# Patient Record
Sex: Female | Born: 1975 | Race: White | Hispanic: No | Marital: Married | Smoking: Never smoker
Health system: Southern US, Community
[De-identification: ages and names within clinical notes are randomized; demographics above are authoritative.]

## PROBLEM LIST (undated history)

## (undated) DIAGNOSIS — K219 Gastro-esophageal reflux disease without esophagitis: Secondary | ICD-10-CM

## (undated) DIAGNOSIS — D649 Anemia, unspecified: Secondary | ICD-10-CM

## (undated) DIAGNOSIS — L732 Hidradenitis suppurativa: Secondary | ICD-10-CM

## (undated) DIAGNOSIS — R011 Cardiac murmur, unspecified: Secondary | ICD-10-CM

## (undated) DIAGNOSIS — M722 Plantar fascial fibromatosis: Secondary | ICD-10-CM

## (undated) DIAGNOSIS — E669 Obesity, unspecified: Secondary | ICD-10-CM

## (undated) DIAGNOSIS — F32A Depression, unspecified: Secondary | ICD-10-CM

## (undated) DIAGNOSIS — F329 Major depressive disorder, single episode, unspecified: Secondary | ICD-10-CM

## (undated) DIAGNOSIS — F419 Anxiety disorder, unspecified: Secondary | ICD-10-CM

## (undated) DIAGNOSIS — N801 Endometriosis of ovary: Secondary | ICD-10-CM

## (undated) DIAGNOSIS — J45909 Unspecified asthma, uncomplicated: Secondary | ICD-10-CM

## (undated) DIAGNOSIS — R112 Nausea with vomiting, unspecified: Secondary | ICD-10-CM

## (undated) DIAGNOSIS — Z22322 Carrier or suspected carrier of Methicillin resistant Staphylococcus aureus: Secondary | ICD-10-CM

## (undated) DIAGNOSIS — T8859XA Other complications of anesthesia, initial encounter: Secondary | ICD-10-CM

## (undated) DIAGNOSIS — N80109 Endometriosis of ovary, unspecified side, unspecified depth: Secondary | ICD-10-CM

## (undated) DIAGNOSIS — Z9889 Other specified postprocedural states: Secondary | ICD-10-CM

## (undated) DIAGNOSIS — K449 Diaphragmatic hernia without obstruction or gangrene: Secondary | ICD-10-CM

## (undated) DIAGNOSIS — N83209 Unspecified ovarian cyst, unspecified side: Secondary | ICD-10-CM

## (undated) DIAGNOSIS — T4145XA Adverse effect of unspecified anesthetic, initial encounter: Secondary | ICD-10-CM

## (undated) DIAGNOSIS — T7840XA Allergy, unspecified, initial encounter: Secondary | ICD-10-CM

## (undated) DIAGNOSIS — M1990 Unspecified osteoarthritis, unspecified site: Secondary | ICD-10-CM

## (undated) DIAGNOSIS — L4050 Arthropathic psoriasis, unspecified: Secondary | ICD-10-CM

## (undated) DIAGNOSIS — R51 Headache: Secondary | ICD-10-CM

## (undated) DIAGNOSIS — G5600 Carpal tunnel syndrome, unspecified upper limb: Secondary | ICD-10-CM

## (undated) HISTORY — PX: DILATION AND CURETTAGE OF UTERUS: SHX78

## (undated) HISTORY — DX: Carrier or suspected carrier of methicillin resistant Staphylococcus aureus: Z22.322

## (undated) HISTORY — DX: Obesity, unspecified: E66.9

## (undated) HISTORY — PX: TONSILLECTOMY: SUR1361

## (undated) HISTORY — PX: CHOLECYSTECTOMY: SHX55

## (undated) HISTORY — PX: FRACTURE SURGERY: SHX138

## (undated) HISTORY — DX: Allergy, unspecified, initial encounter: T78.40XA

## (undated) HISTORY — DX: Gastro-esophageal reflux disease without esophagitis: K21.9

## (undated) HISTORY — DX: Major depressive disorder, single episode, unspecified: F32.9

## (undated) HISTORY — DX: Plantar fascial fibromatosis: M72.2

## (undated) HISTORY — DX: Arthropathic psoriasis, unspecified: L40.50

## (undated) HISTORY — DX: Diaphragmatic hernia without obstruction or gangrene: K44.9

## (undated) HISTORY — DX: Endometriosis of ovary: N80.1

## (undated) HISTORY — DX: Hidradenitis suppurativa: L73.2

## (undated) HISTORY — DX: Anxiety disorder, unspecified: F41.9

## (undated) HISTORY — DX: Endometriosis of ovary, unspecified side, unspecified depth: N80.109

## (undated) HISTORY — DX: Anemia, unspecified: D64.9

## (undated) HISTORY — DX: Carpal tunnel syndrome, unspecified upper limb: G56.00

## (undated) HISTORY — DX: Unspecified ovarian cyst, unspecified side: N83.209

## (undated) HISTORY — PX: ABDOMINAL HYSTERECTOMY: SHX81

## (undated) HISTORY — DX: Depression, unspecified: F32.A

## (undated) HISTORY — DX: Unspecified osteoarthritis, unspecified site: M19.90

---

## 1995-03-24 HISTORY — PX: DIAGNOSTIC LAPAROSCOPY: SUR761

## 1997-09-27 ENCOUNTER — Inpatient Hospital Stay (HOSPITAL_COMMUNITY): Admission: AD | Admit: 1997-09-27 | Discharge: 1997-09-27 | Payer: Self-pay | Admitting: Obstetrics & Gynecology

## 1997-10-01 ENCOUNTER — Ambulatory Visit (HOSPITAL_COMMUNITY): Admission: RE | Admit: 1997-10-01 | Discharge: 1997-10-01 | Payer: Self-pay | Admitting: Obstetrics and Gynecology

## 1997-10-20 ENCOUNTER — Inpatient Hospital Stay (HOSPITAL_COMMUNITY): Admission: AD | Admit: 1997-10-20 | Discharge: 1997-10-22 | Payer: Self-pay | Admitting: Obstetrics and Gynecology

## 1998-10-07 ENCOUNTER — Other Ambulatory Visit: Admission: RE | Admit: 1998-10-07 | Discharge: 1998-10-07 | Payer: Self-pay | Admitting: Obstetrics and Gynecology

## 1998-11-12 ENCOUNTER — Inpatient Hospital Stay (HOSPITAL_COMMUNITY): Admission: AD | Admit: 1998-11-12 | Discharge: 1998-11-12 | Payer: Self-pay | Admitting: Obstetrics and Gynecology

## 1998-11-12 ENCOUNTER — Encounter: Payer: Self-pay | Admitting: Obstetrics and Gynecology

## 1999-06-15 ENCOUNTER — Inpatient Hospital Stay (HOSPITAL_COMMUNITY): Admission: AD | Admit: 1999-06-15 | Discharge: 1999-06-15 | Payer: Self-pay | Admitting: Obstetrics and Gynecology

## 1999-07-01 ENCOUNTER — Inpatient Hospital Stay (HOSPITAL_COMMUNITY): Admission: AD | Admit: 1999-07-01 | Discharge: 1999-07-04 | Payer: Self-pay | Admitting: Obstetrics and Gynecology

## 1999-11-03 ENCOUNTER — Other Ambulatory Visit: Admission: RE | Admit: 1999-11-03 | Discharge: 1999-11-03 | Payer: Self-pay | Admitting: Obstetrics and Gynecology

## 2000-11-09 ENCOUNTER — Other Ambulatory Visit: Admission: RE | Admit: 2000-11-09 | Discharge: 2000-11-09 | Payer: Self-pay | Admitting: Obstetrics and Gynecology

## 2001-11-17 ENCOUNTER — Other Ambulatory Visit: Admission: RE | Admit: 2001-11-17 | Discharge: 2001-11-17 | Payer: Self-pay | Admitting: Obstetrics and Gynecology

## 2002-06-08 ENCOUNTER — Emergency Department (HOSPITAL_COMMUNITY): Admission: EM | Admit: 2002-06-08 | Discharge: 2002-06-08 | Payer: Self-pay

## 2002-07-20 ENCOUNTER — Encounter: Admission: RE | Admit: 2002-07-20 | Discharge: 2002-07-20 | Payer: Self-pay | Admitting: Family Medicine

## 2002-07-20 ENCOUNTER — Encounter: Payer: Self-pay | Admitting: Family Medicine

## 2002-11-19 ENCOUNTER — Other Ambulatory Visit: Admission: RE | Admit: 2002-11-19 | Discharge: 2002-11-19 | Payer: Self-pay | Admitting: Obstetrics and Gynecology

## 2003-07-30 ENCOUNTER — Observation Stay (HOSPITAL_COMMUNITY): Admission: RE | Admit: 2003-07-30 | Discharge: 2003-07-31 | Payer: Self-pay | Admitting: General Surgery

## 2003-07-30 ENCOUNTER — Encounter (INDEPENDENT_AMBULATORY_CARE_PROVIDER_SITE_OTHER): Payer: Self-pay

## 2004-01-15 ENCOUNTER — Other Ambulatory Visit: Admission: RE | Admit: 2004-01-15 | Discharge: 2004-01-15 | Payer: Self-pay | Admitting: *Deleted

## 2005-01-19 ENCOUNTER — Other Ambulatory Visit: Admission: RE | Admit: 2005-01-19 | Discharge: 2005-01-19 | Payer: Self-pay | Admitting: *Deleted

## 2005-05-06 ENCOUNTER — Encounter: Admission: RE | Admit: 2005-05-06 | Discharge: 2005-05-06 | Payer: Self-pay | Admitting: *Deleted

## 2006-01-26 ENCOUNTER — Other Ambulatory Visit: Admission: RE | Admit: 2006-01-26 | Discharge: 2006-01-26 | Payer: Self-pay | Admitting: Obstetrics & Gynecology

## 2007-01-13 ENCOUNTER — Encounter: Admission: RE | Admit: 2007-01-13 | Discharge: 2007-01-13 | Payer: Self-pay | Admitting: Family Medicine

## 2007-03-16 ENCOUNTER — Other Ambulatory Visit: Admission: RE | Admit: 2007-03-16 | Discharge: 2007-03-16 | Payer: Self-pay | Admitting: Obstetrics and Gynecology

## 2007-08-24 DIAGNOSIS — M722 Plantar fascial fibromatosis: Secondary | ICD-10-CM

## 2007-08-24 HISTORY — DX: Plantar fascial fibromatosis: M72.2

## 2008-03-21 ENCOUNTER — Other Ambulatory Visit: Admission: RE | Admit: 2008-03-21 | Discharge: 2008-03-21 | Payer: Self-pay | Admitting: Obstetrics & Gynecology

## 2010-07-23 HISTORY — PX: LAPAROSCOPIC TOTAL HYSTERECTOMY: SUR800

## 2010-07-28 ENCOUNTER — Encounter: Payer: Self-pay | Admitting: Obstetrics and Gynecology

## 2010-07-28 ENCOUNTER — Ambulatory Visit (HOSPITAL_COMMUNITY)
Admission: RE | Admit: 2010-07-28 | Discharge: 2010-07-29 | Payer: Self-pay | Source: Home / Self Care | Attending: Obstetrics and Gynecology | Admitting: Obstetrics and Gynecology

## 2010-09-23 NOTE — Op Note (Signed)
Mary Davenport, Mary Davenport              ACCOUNT NO.:  [id number]  MEDICAL RECORD NO.:  [id number]          PATIENT TYPE:  AMB  LOCATION:  SDC                           FACILITY:  [facility]  PHYSICIAN:  Cynthia P. Romine, M.D.DATE OF BIRTH:  Mar 11, 1976  DATE OF PROCEDURE:  07/28/2010 DATE OF DISCHARGE:                              OPERATIVE REPORT   PREOPERATIVE DIAGNOSES:  Menorrhagia, dyspareunia, pelvic pain, known endometriosis.  POSTOPERATIVE DIAGNOSES:  Menorrhagia, dyspareunia, pelvic pain, known endometriosis, pathology pending.  PROCEDURE:  Robotic-assisted total laparoscopic hysterectomy.  SURGEON:  Cynthia P. Romine, M.D.  ASSISTANT:  Dr. Leda Quail.  ANESTHESIA:  General endotracheal.  ESTIMATED BLOOD LOSS:  50 mL.  COMPLICATIONS:  None.  PROCEDURE:  The patient was taken to the operating room and after induction of adequate general endotracheal anesthesia, she was placed in the low dorsal lithotomy position and prepped and draped in the usual fashion.  A posterior weighted and anterior Deaver were placed.  The cervix was grasped on its anterior lip with a single-tooth tenaculum. The uterus sounded to 7 cm.  The cervix was dilated to a #19 Shawnie Pons.  A 6- cm RUMI manipulator with a small KOH ring was placed without difficulty. The bladder was drained with a Foley catheter.  Attention was next turned to the abdomen.  The infraumbilical crease was infiltrated with 0.25% Marcaine plain at the site of previous laparoscopy.  The skin was incised with a knife.  A Veress needle was inserted into peritoneal space.  Proper placement was tested by noting a negative aspirate, then free flow of saline to the Veress needle again with a negative aspirate, and by noting the response of saline placed at the hub of the Veress needle to negative pressure as the abdominal wall was elevated.  When the pneumoperitoneum was attempted to be placed, it was noted that the pressure was running  at about 15 mmHg and it was not felt that the Veress needle was in the proper place.  This was removed.  Another attempt was made with the Veress needle, which was likewise unsuccessful.  A third attempt was made with a long Veress needle and this was also unsuccessful, always having the pressure around 15 mmHg despite the hanging drop test of the Veress needle.  Therefore, open laparoscopy was decided upon.  The fascia was cleared off bluntly and grasped with Kochers, it was elevated between the Graham and entered with the Mayo scissors.  The fascia was separated, the underlying peritoneum was elevated between hemostats and entered atraumatically.  A pursestring suture was placed around the fascia with 0 Vicryl and a Hasson trocar was placed into peritoneal space.  The laparoscope was then inserted initially at 30 degree down, laparoscope was used but it was found to be difficult to visualize the pelvis well and it was removed and a 0-degree scope was placed.  Pneumoperitoneum was created to an abdominal pressure of 15 mmHg using the automatic insufflator. The scope was then used to transilluminate the abdominal wall and the site for the two 8-mm robotic trocars and the 12-mm assistance port were marked with a marking pen,  incised with Marcaine, and the trocars were inserted under direct visualization.  The robot was then brought in and docked from side without difficulty.  Instruments were placed through the port using monopolar scissors in arm 1 and that the PK gyrus in arm 2.  The instrument was brought in under direct visualization.  The procedure then proceeded to the console.  The pelvis was inspected.  The tubes and ovaries appeared normal.  She had had some adhesions between her right ovary and the sidewall at a previous laparoscopy and it was anticipated that there would be some adhesions but there were none. Really, no evidence of persistent endometriosis could be seen,  ureters were seen peristalsing bilaterally very clearly.  Decision was made to retain the ovary.  The PK gyrus was used to cauterize the pedicle containing the tube, the utero-ovarian ligament, and the round ligament on each side.  The anterior and posterior leaves of broad ligament were taken down sharply on each side.  The bladder was dissected off the KOH ring with sharp dissection and cautery.  The uterine arteries on each side were skeletonized, then cauterized with the gyrus and cut.  A circumferential colpotomy incision was then made with monopolar scissors.  The specimen was delivered into the vagina and left there for pneumo-occlusion.  The uterine artery on the right was noted to be slightly bleeding and this was further cauterized.  Instruments were then removed and the mega suture cut needle holder was placed in 1 and the PK gyrus in arm 2.  A 0 Vicryl was used to place figure-of-eight angle sutures on each side.  After placing the angle suture on the patient's right, the end of the suture was used to elevate the vaginal cuff and further inspect that uterine artery.  It was further cauterized to assure hemostasis.  The course of the ureter was clearly seen and the area of cautery was well away from the ureter.  The vaginal cuff was closed with a total of 4 figure-of-eight 0 Vicryl sutures.  The pelvis was irrigated.  It was hemostatic.  A sheet of intercede was placed over the vaginal cuff.  The instruments removed from the abdomen.  The trocar sleeves were removed under direct visualization.  The scope was then withdrawn.  Pneumoperitoneum was completely released using manual deep breath and pressure on the abdominal wall to remove the gas from the peritoneal cavity.  The umbilical trocar sleeve was then removed.  The pursestring suture was tied.  A single suture of 0 Vicryl was placed in the fascia at the assistance port in the right lower quadrant.  All 4 incisions were  closed subcuticularly with 4-0 Vicryl Rapide and then Dermabond.  The specimen was removed from the vagina.  Foley catheter was removed and the procedure was terminated.  Sponge and instrument counts were correct x3.  The patient tolerated it well and satisfactory condition to postanesthesia recovery.     Cynthia P. Romine, M.D.     CPR/MEDQ  D:  07/28/2010  T:  07/29/2010  Job:  [id number]  Electronically Signed by Meredeth Ide M.D. on 09/23/2010 08:53:49 PM

## 2010-11-03 LAB — COMPREHENSIVE METABOLIC PANEL
Alkaline Phosphatase: 62 U/L (ref 39–117)
BUN: 9 mg/dL (ref 6–23)
Glucose, Bld: 82 mg/dL (ref 70–99)
Potassium: 4 mEq/L (ref 3.5–5.1)
Total Bilirubin: 0.4 mg/dL (ref 0.3–1.2)
Total Protein: 6.9 g/dL (ref 6.0–8.3)

## 2010-11-03 LAB — CBC
HCT: 35.6 % — ABNORMAL LOW (ref 36.0–46.0)
HCT: 39.9 % (ref 36.0–46.0)
MCV: 90.8 fL (ref 78.0–100.0)
Platelets: 382 10*3/uL (ref 150–400)
RDW: 12 % (ref 11.5–15.5)
RDW: 12.3 % (ref 11.5–15.5)
WBC: 12 10*3/uL — ABNORMAL HIGH (ref 4.0–10.5)
WBC: 6.4 10*3/uL (ref 4.0–10.5)

## 2010-11-03 LAB — HCG, SERUM, QUALITATIVE: Preg, Serum: NEGATIVE

## 2011-01-08 NOTE — Op Note (Signed)
NAME:  Mary Davenport, Mary Davenport                        ACCOUNT NO.:  [id number]   MEDICAL RECORD NO.:  [id number]                   PATIENT TYPE:  AMB   LOCATION:  DAY                                  FACILITY:  [facility]   PHYSICIAN:  Adolph Pollack, M.D.            DATE OF BIRTH:  [birth year]   DATE OF PROCEDURE:  07/30/2003  DATE OF DISCHARGE:                                 OPERATIVE REPORT   PREOPERATIVE DIAGNOSIS:  Symptomatic cholelithiasis.   POSTOPERATIVE DIAGNOSIS:  Symptomatic cholelithiasis.   PROCEDURE:  Laparoscopic cholecystectomy with intraoperative cholangiogram.   SURGEON:  Adolph Pollack, M.D.   ASSISTANT:  Sheppard Plumber. Earlene Plater, M.D.   INTRAOPERATIVE CONSULTATION:  Andres Ege, M.D.   INDICATIONS:  This is a 35 year old female who has been having intermittent  abdominal pains in the right lower quadrant but then began having some  pressure-type abdominal right upper quadrant pains associated with nausea.  She had a history of endometriosis in the past.  A CT scan was performed  demonstrating multiple gallstones.  She now presents for a laparoscopic  cholecystectomy and also intraoperative observation of her pelvis organs by  Dr. Laurena Bering looking for recurrent endometriosis.   TECHNIQUE:  She is seen in the holding area, then brought to the operating  room, placed supine on the operating table, and the general anesthetic was  administered.  Her abdominal wall was then sterilely prepped and draped.  Dilute Marcaine solution was infiltrated in the subumbilical region and a  subumbilical incision is made incising the skin and subcutaneous tissue  sharply.  The fascia was identified and a small incision made in it.  A  small incision was made in the peritoneum and the peritoneal cavity and was  entered under direct vision.  A pursestring suture of 0 Vicryl was placed  around the fascial edges.  A Hasson trocar was introduced into the  peritoneal cavity and a  pneumoperitoneum was created by insufflation of CO2  gas.   Next the laparoscope was introduced and a single trocar is placed in the  right midabdomen.  The patient placed in Trendelenburg position and the  pelvic organs were examined.  The anterior and posterior aspects of the  uterus as well as the cul-de-sacs and prececal area were evaluated, and no  endometriosis was noted.  Both the adnexa and the ovaries were seen without  any evidence of endometriosis.  No pelvic sidewall endometriosis or implants  on the colon.  Pictures were taken and given to Dr. Laurena Bering, who is present.   Following this she was placed in the reverse Trendelenburg position with the  right side tilted up.  Another 5 mm trocar is placed through a right  midabdominal incision and an 11 mm trocar is placed through an epigastric  incision.  The fundus of the gallbladder is grasped and retracted to the  right shoulder.  Filmy adhesions between the duodenum and the  gallbladder  were taken down bluntly.  The infundibulum was then grasped and retracted  laterally and using blunt dissection and select cautery, the infundibulum  was mobilized.  The cystic duct was then identified and a window created  around it.  A clip was placed just above the cystic duct-gallbladder  junction and a small incision was made in the cystic duct.  A  cholangiocatheter was passed through the anterior abdominal wall and placed  into the cystic duct and the cholangiogram was performed.   Under real-time fluoroscopy dilute contrast material was injected into the  cystic duct, which was of moderate to long length.  The common hepatic,  right and left hepatic, and common bile ducts all filled promptly and  contrast spilled promptly to the duodenum. without obvious evidence of  obstruction.  The final report is pending the radiologist's interpretation.   The cholangiocatheter was removed, and the cystic duct was then clipped  three times on the  staying-in side and then divided.  The cystic artery was  identified and a window created around it.  It was clipped and divided.  The  gallbladder was then dissected free from the liver bed intact.  The  gallbladder fossa was then irrigated and bleeding points controlled with the  cautery.  It was inspected a number of times and no further bleeding was  noted.  No bile leak was noted.  The gallbladder was then removed through  the subumbilical port.  Multiple stones were noted.  The subumbilical  fascial defect was then closed under direct vision by tightening up and  tying down the pursestring suture.  The perihepatic area was irrigated and  the fluid returned clear.  No blood or bile staining was noted.   Following this the trocars were removed and the pneumoperitoneum was  released.  The skin incisions were closed with 4-0 Monocryl subcuticular  stitches.  Steri-Strips and sterile dressings were applied.   She tolerated the procedure well without any apparent complications and was  taken to the recovery room in satisfactory condition.                                               Adolph Pollack, M.D.    Kari Baars  D:  07/30/2003  T:  07/30/2003  Job:  [id number]   cc:   Bryan Lemma. Manus Gunning, M.D.  [address] [place]  [place] [address]  Fax: [phone]   Laqueta Linden, M.D.  [address]  [place]  [place] [address]  Fax: [phone]   Andres Ege, M.D.  [address]  [place]  [place] [address]  Fax: [phone]

## 2011-01-08 NOTE — Consult Note (Signed)
NAME:  Mary Davenport, Mary Davenport                        ACCOUNT NO.:  [id number]   MEDICAL RECORD NO.:  [id number]                   PATIENT TYPE:  AMB   LOCATION:  DAY                                  FACILITY:  [facility]   PHYSICIAN:  Andres Ege, M.D.            DATE OF BIRTH:  11-15-1975   DATE OF CONSULTATION:  07/30/2003  DATE OF DISCHARGE:                                   CONSULTATION   PRIMARY OPERATING SURGEON:  Adolph Pollack, M.D.   PREOPERATIVE DIAGNOSES:  Chronic pelvic pain with a history of minimal  endometriosis.   POSTOPERATIVE DIAGNOSES:  Normal pelvis.   HISTORY AND INDICATIONS FOR CONSULT:  [name] is a 35 year old  gravida 3, para 2-0-1-2 with a last menstrual period June 27, 2003 with  chronic pelvic pain.  Ms. Brogden had undergone a previous diagnostic  laparoscopy in August of 1996 at which time she was noted to have minimal  endometriosis.  She subsequently had two vaginal deliveries and resumed oral  contraceptive pills.  She had not been seen for some time in our office  until April of 2004 when she presented with complaints of back pain and  abdominal pain despite being on multiple types of oral contraceptive pills.  She stated that the pain was the same as before when she had endometriosis.  It was not related to menses.  She felt that the pain was sometimes on the  right and occasionally on the left and radiated through to the back.  Prior  to being seen in our office she had been treated with Seasonale, Depo-  Provera, Cyclessa, and Ortho-Evra.  At the time she presented she had been  on Seasonale since March of 2004.  She has been seen on several occasions  since then.  Has been treated for endometritis, but continues to have  episodic severe pelvic pain.  Pelvic ultrasound performed in September was  completely normal.  Since her pain episodes which were intense tended to be  more on the right, general surgery consultation was obtained and  she was  scheduled for laparoscopic cholecystectomy.   It was agreed with the patient that at the time of her cholecystectomy I  would come to the operating room and visualize the pelvis to look for  evidence of endometriosis or other pelvic pathology.   Following the placement of the laparoscope and accessory trocar by Adolph Pollack, M.D., with his manipulation I was able to visualize the anterior  and posterior cul-de-sacs which were completely clear.  Both ovaries were  unremarkable and small, consistent with oral contraceptive pills.  The  fallopian tubes were free and mobile.  There were no visible endometriotic  lesions.  The appendix was likewise unremarkable.  Andres Ege, M.D.    JCS/MEDQ  D:  07/30/2003  T:  07/30/2003  Job:  [id number]

## 2011-03-23 ENCOUNTER — Other Ambulatory Visit: Payer: Self-pay | Admitting: Physician Assistant

## 2011-06-10 ENCOUNTER — Other Ambulatory Visit: Payer: Self-pay | Admitting: Family Medicine

## 2011-06-10 DIAGNOSIS — R1013 Epigastric pain: Secondary | ICD-10-CM

## 2011-06-15 ENCOUNTER — Ambulatory Visit
Admission: RE | Admit: 2011-06-15 | Discharge: 2011-06-15 | Disposition: A | Discharge status: Home / Self Care | Payer: PRIVATE HEALTH INSURANCE | Source: Ambulatory Visit | Attending: Family Medicine | Admitting: Family Medicine

## 2011-06-15 DIAGNOSIS — R1013 Epigastric pain: Secondary | ICD-10-CM

## 2013-04-25 ENCOUNTER — Other Ambulatory Visit (INDEPENDENT_AMBULATORY_CARE_PROVIDER_SITE_OTHER): Payer: Self-pay

## 2013-04-25 ENCOUNTER — Ambulatory Visit (INDEPENDENT_AMBULATORY_CARE_PROVIDER_SITE_OTHER): Payer: BC Managed Care – PPO | Admitting: Surgery

## 2013-04-25 ENCOUNTER — Encounter (INDEPENDENT_AMBULATORY_CARE_PROVIDER_SITE_OTHER): Payer: Self-pay | Admitting: Surgery

## 2013-04-25 DIAGNOSIS — Z6841 Body Mass Index (BMI) 40.0 and over, adult: Secondary | ICD-10-CM

## 2013-04-25 DIAGNOSIS — K219 Gastro-esophageal reflux disease without esophagitis: Secondary | ICD-10-CM | POA: Insufficient documentation

## 2013-04-25 NOTE — Patient Instructions (Signed)
Sleeve Gastrectomy A sleeve gastrectomy is an operation that removes a large portion of your stomach. This operation is performed to help you lose weight. You lose weight with this operation because it restricts the amount of food you can eat. Your stomach will be a narrow tube after the operation (the size of a banana). Your stomach will hold much less food than your normal stomach. Also, the portion of your stomach that is removed produces a hormone that causes hunger. You are a candidate for this operation if you have morbid obesity, defined as a body mass index (BMI) greater than 40. You may also be a candidate if you have severe obesity related diseases such as: diabetes mellitus 2, obstructive sleep apnea, or cardiopulmonary disease (heart and lung) with a BMI greater than 35. You will need to talk with your surgeon and insurance company to find out if this surgery is right for you.  Sleeve gastrectomy is a good alternative to other treatments of obesity (bariatric) operations. It does not require any adjustments after the operation compared with an adjustable gastric band. Also, it is safer than a gastric bypass. RISKS AND COMPLICATIONS Some of the problems that can occur from this procedure include:  Infection. A germ starts growing in the incision sites. This can usually be treated with antibiotics.  Bleeding. This can occur with any surgery. Your surgeon will take all precautions to minimize this risk.  Damage to tissue or organs in the area may occur. If there is excessive damage, the surgeon may need to change to an open surgery. In this case, one large incision will be made in the center of your abdomen.  Leakage. The fluid in your stomach may leak into the abdominal cavity. If this happens, you may need another surgery to fix the leak. BEFORE THE PROCEDURE Before your operation you will meet with your surgeon and their team for the treatment of obesity. Here you will find out if you are  a candidate for bariatric surgery. The risks and the benefits of the operation will be explained. You will also meet a:  Dietician who will guide you with your preoperative and postoperative diet.  An internal medical doctor to manage your obesity related illnesses.  A psychology team to help with cravings or other mental difficulties. In addition:  You will be directed to have certain lab work and x-rays performed.  You will schedule a special test called a manometry. This test evaluates your esophagus and how it moves.  You will be placed on a special liquid diet two to three weeks before your operation. This diet helps you lose weight before the operation and decrease the amount of fat in the abdomen. It makes the operation easier for the surgeon and safer for the patient. The dietician will share the details of this with you. Before your operation:  Make sure you follow your surgeon's instructions exactly. Stop or continue medications they recommend.  Do not eat or drink anything after midnight.  Arrive at the hospital 1 hour before your surgery for check in.  Shower the morning of your operation. PROCEDURE  Most sleeve gastrectomies are performed using a laparoscope. A laparoscope is a thin, lighted, pencil-sized tube. Once you are anesthetized (asleep), the surgeon inflates your belly (abdomen) with a gas (carbon dioxide) that makes room to operate. It also makes your organs easier to see. The laparoscope is inserted into the abdomen through a small incision. Other small instruments are inserted into the abdomen   through other small incisions. During the operation, the stomach is divided using a stapler. Part of the stomach is removed through one of the incisions. The remaining stomach is reinforced using a stitch (suture) and surgical glue to prevent leakage of the gastric contents. At the end of the procedure, the gas is removed from the inside of your abdomen. The incisions are closed  with stitches. These may be covered with a dressing or left open. Because the incisions are small, there is usually minimal discomfort. You will wake up in a recovery room. Once your anesthesia has worn off, you will be moved to your hospital room. AFTER THE PROCEDURE  You will stay in the hospital, on average, for two days.  You will be given pain medication and anti-nausea medication.  You may have a drain from one of the incisions in your abdomen. This drain will stay in place until your first postoperative visit.  The nursing staff will assist you in getting out of bed the day of, or one day after, your surgery.  You will start on a liquid diet, the first day after your operation. The dietician will recommend this diet.  Taking deep breaths and coughing is very important to avoid pneumonia. Document Released: 06/06/2009 Document Revised: 11/01/2011 Document Reviewed: 06/06/2009 [employer] Patient Information 2014 [employer].  

## 2013-04-25 NOTE — Progress Notes (Signed)
Chief Complaint:  Adult history of morbid obesity  History of Present Illness:  Mary Davenport is an 37 y.o. female who has been to one of our seminars and desires gastric sleeve. She is done research on sleeve gastrectomy and has spoken to patients who have undergone this procedure. I reviewed the procedure and discussed its potential risk and complications and its potential benefits. She does have reflux disease and indicated that that would be something that could continue her be made worse with a sleeve hopefully we will be able to repair her hiatal hernia and treat that definitively.  She is excited and ready to go ahead and get scheduled and again I think her fund of knowledge about sleeve gastrectomy is good.  Past Medical History  Diagnosis Date  . Arthritis   . Endometriosis of ovary   . GERD (gastroesophageal reflux disease)   . Allergy   . MRSA carrier     Past Surgical History  Procedure Laterality Date  . Abdominal hysterectomy    . Cholecystectomy    . Dilation and curettage of uterus      Current Outpatient Prescriptions  Medication Sig Dispense Refill  . naproxen sodium (ANAPROX) 220 MG tablet Take 220 mg by mouth 2 (two) times daily with a meal.      . omeprazole (PRILOSEC) 20 MG capsule Take 20 mg by mouth daily.       No current facility-administered medications for this visit.   Iodinated diagnostic agents; Sulfa antibiotics; Erythromycin; and Sudafed Family History  Problem Relation Age of Onset  . Heart disease Mother   . Hyperlipidemia Mother   . Hypertension Mother    Social History:   reports that she has never smoked. She has never used smokeless tobacco. She reports that she does not drink alcohol or use illicit drugs.   REVIEW OF SYSTEMS - PERTINENT POSITIVES ONLY: Review of systems is negative in general, negative breast, negative infectious disease except for a history of being a MRSA carrier, negative for dental, negative cardiac, positive for  asthma, normal endocrine, skin included fresh and easy bruisability, GI was positive for hiatal hernia or reflux, GU was normal, neurologic normal, hematologic was normal, new system was normal, HEENT exam is positive for sinus problems and runny nose and glasses or contacts. Musculoskeletal is positive for joint pain and arthritis. She is followed by Dr. Jeanmarie Hubert.   She denies any problems with snoring or issues with structures sleep apnea.  Physical Exam:   Blood pressure 130/78, pulse 66, resp. rate 14, height 5\' 4"  (1.626 m), weight 248 lb (112.492 kg). Body mass index is 42.55 kg/(m^2).  Gen:  WDWN white female NAD  Neurological: Alert and oriented to person, place, and time. Motor and sensory function is grossly intact  Head: Normocephalic and atraumatic.  Eyes: Conjunctivae are normal. Pupils are equal, round, and reactive to light. No scleral icterus.  Neck: Normal range of motion. Neck supple. No tracheal deviation or thyromegaly present.  Cardiovascular:  SR without murmurs or gallops.  No carotid bruits Respiratory: Effort normal.  No respiratory distress. No chest wall tenderness. Breath sounds normal.  No wheezes, rales or rhonchi.  Abdomen:  Obese nontender. She has scars reflecting a previous laparoscopic cholecystectomy and a robotic hysterectomy done by Dr. Purvis Sheffield.   GU: Musculoskeletal: Normal range of motion. Extremities are nontender. No cyanosis, edema or clubbing noted Lymphadenopathy: No cervical, preauricular, postauricular or axillary adenopathy is present Skin: Skin is warm and dry.  No rash noted. No diaphoresis. No erythema. No pallor. Pscyh: Normal mood and affect. Behavior is normal. Judgment and thought content normal.   LABORATORY RESULTS: No results found for this or any previous visit (from the past 48 hour(s)).  RADIOLOGY RESULTS: No results found.  Problem List: There are no active problems to display for this patient.   Assessment &  Plan: Morbid obesity with arthritis. We'll move toward a sleeve gastrectomy.    Matt B. Daphine Deutscher, MD, [facility] [phone] beeper [phone]  04/25/2013 11:29 AM

## 2013-04-26 LAB — LIPID PANEL
Cholesterol: 164 mg/dL (ref 0–200)
HDL: 41 mg/dL (ref >39)
Total CHOL/HDL Ratio: 4 Ratio

## 2013-04-26 LAB — CBC WITH DIFFERENTIAL/PLATELET
Hemoglobin: 12.8 g/dL (ref 12.0–15.0)
Lymphocytes Relative: 43 % (ref 12–46)
Lymphs Abs: 3.5 10*3/uL (ref 0.7–4.0)
Monocytes Relative: 8 % (ref 3–12)
Neutro Abs: 3.8 10*3/uL (ref 1.7–7.7)
Neutrophils Relative %: 47 % (ref 43–77)
RBC: 4.44 MIL/uL (ref 3.87–5.11)
WBC: 8.1 10*3/uL (ref 4.0–10.5)

## 2013-04-27 LAB — H. PYLORI ANTIBODY, IGG: H Pylori IgG: 0.42 {ISR}

## 2013-05-15 ENCOUNTER — Encounter: Payer: BC Managed Care – PPO | Attending: Surgery | Admitting: *Deleted

## 2013-05-15 ENCOUNTER — Ambulatory Visit (HOSPITAL_COMMUNITY)
Admission: RE | Admit: 2013-05-15 | Discharge: 2013-05-15 | Disposition: A | Discharge status: Home / Self Care | Payer: BC Managed Care – PPO | Source: Ambulatory Visit | Attending: Surgery | Admitting: Surgery

## 2013-05-15 ENCOUNTER — Other Ambulatory Visit: Payer: Self-pay

## 2013-05-15 ENCOUNTER — Encounter: Payer: Self-pay | Admitting: *Deleted

## 2013-05-15 DIAGNOSIS — K219 Gastro-esophageal reflux disease without esophagitis: Secondary | ICD-10-CM | POA: Insufficient documentation

## 2013-05-15 DIAGNOSIS — Z6841 Body Mass Index (BMI) 40.0 and over, adult: Secondary | ICD-10-CM | POA: Insufficient documentation

## 2013-05-15 DIAGNOSIS — M129 Arthropathy, unspecified: Secondary | ICD-10-CM | POA: Insufficient documentation

## 2013-05-15 DIAGNOSIS — Z9089 Acquired absence of other organs: Secondary | ICD-10-CM | POA: Insufficient documentation

## 2013-05-15 DIAGNOSIS — K449 Diaphragmatic hernia without obstruction or gangrene: Secondary | ICD-10-CM | POA: Insufficient documentation

## 2013-05-15 DIAGNOSIS — Z713 Dietary counseling and surveillance: Secondary | ICD-10-CM | POA: Insufficient documentation

## 2013-05-15 DIAGNOSIS — Z22322 Carrier or suspected carrier of Methicillin resistant Staphylococcus aureus: Secondary | ICD-10-CM | POA: Insufficient documentation

## 2013-05-15 DIAGNOSIS — J45909 Unspecified asthma, uncomplicated: Secondary | ICD-10-CM | POA: Insufficient documentation

## 2013-05-15 NOTE — Patient Instructions (Addendum)
   Follow Pre-Op Nutrition Goals to prepare for Sleeve Gastrectomy Surgery.   Call the [facility] at [phone] once you have been given your surgery date to enrolled in the Pre-Op Nutrition Class. You will need to attend this nutrition class 3-4 weeks prior to your surgery.  

## 2013-05-15 NOTE — Progress Notes (Signed)
  Pre-Op Assessment Visit:  Pre-Operative Sleeve Gastrectomy Surgery  Medical Nutrition Therapy:  Appt start time: 1100   End time:  1200.  Patient was seen on 05/15/2013 for Pre-Operative Sleeve Gastrectomy Nutrition Assessment. Assessment and letter of approval faxed to [facility] Bariatric Surgery Program coordinator on 05/15/2013.   Handouts given during visit include:  Pre-Op Goals Bariatric Surgery Protein Shakes  Patient to call the [facility] to enroll in Pre-Op and Post-Op Nutrition Education when surgery date is scheduled.

## 2013-06-07 ENCOUNTER — Encounter: Payer: BC Managed Care – PPO | Attending: Surgery | Admitting: *Deleted

## 2013-06-07 DIAGNOSIS — Z713 Dietary counseling and surveillance: Secondary | ICD-10-CM | POA: Insufficient documentation

## 2013-06-08 NOTE — Patient Instructions (Signed)
Follow:  Pre-Op Diet per MD 2 weeks prior to surgery  Phase 2- Liquids (clear/full) 2 weeks after surgery  Vitamin/Mineral/Calcium guidelines for purchasing bariatric supplements  Exercise guidelines pre and post-op per MD  Follow-up at [facility] in 2 weeks post-op for diet advancement. Contact Anneka Studer at [email or web address] or [phone] as needed with questions/concerns.   

## 2013-06-08 NOTE — Progress Notes (Addendum)
  Pre-Operative Nutrition Class:  Appt start time: 830   End time:  930.  Patient was seen on 06/07/13 for Pre-Operative Bariatric Surgery Education at the [facility].   Surgery date: 07/03/13 Surgery type: Sleeve Start weight at [facility]: 247.5 lbs (05/15/13) Weight today: 247.5 lbs  TANITA  BODY COMP RESULTS  06/07/13   BMI (kg/m^2) 42.5   Fat Mass (lbs) 131.5   Fat Free Mass (lbs) 116.0   Total Body Water (lbs) 85.0   Samples given per MNT protocol. Patient educated on appropriate usage: Bariatric Advantage Multivitamin Lot # [id number]; Exp: 12/15  Bariatric Advantage Calcium Citrate Lot # [id number]; Exp: 08/15  BariActiv Multivitamin Lot: [id number]; Exp: 05/16   BariActiv Iron Lot: [id number]; Exp: 05/16   BariActiv Calcium Lot: [id number]; Exp: 05/16  Celebrate Vitamins Sublingual B12 Lot # [id number]; Exp: 11/15  Unjury Protein Powder Lot # [id number]; Exp: 01/16   The following the learning objectives were met by the patient during this course:  Identify Pre-Op Dietary Goals and will begin 2 weeks pre-operatively  Identify appropriate sources of fluids and proteins   State protein recommendations and appropriate sources pre and post-operatively  Identify Post-Operative Dietary Goals and will follow for 2 weeks post-operatively  Identify appropriate multivitamin and calcium sources  Describe the need for physical activity post-operatively and will follow MD recommendations  State when to call healthcare provider regarding medication questions or post-operative complications  Handouts given during class include:  Pre-Op Bariatric Surgery Diet Handout  Protein Shake Handout  Post-Op Bariatric Surgery Nutrition Handout  BELT Program Information Flyer  Support Group Information Flyer  [facility] Bariatric Supplements Price List  Follow-Up Plan: Patient will follow-up at [facility] 2 weeks post operatively for diet advancement per  MD.

## 2013-06-18 NOTE — Progress Notes (Signed)
Need orders in EPIC.  Surgery scheduled for 07/03/13.  preop on 06/27/13 at 0830.  Thank You.

## 2013-06-22 ENCOUNTER — Encounter (HOSPITAL_COMMUNITY): Payer: Self-pay | Admitting: Pharmacy Technician

## 2013-06-27 ENCOUNTER — Encounter (HOSPITAL_COMMUNITY)
Admission: RE | Admit: 2013-06-27 | Discharge: 2013-06-27 | Disposition: A | Discharge status: Home / Self Care | Payer: BC Managed Care – PPO | Source: Ambulatory Visit | Attending: Surgery | Admitting: Surgery

## 2013-06-27 ENCOUNTER — Encounter (HOSPITAL_COMMUNITY): Payer: Self-pay

## 2013-06-27 DIAGNOSIS — Z01812 Encounter for preprocedural laboratory examination: Secondary | ICD-10-CM | POA: Insufficient documentation

## 2013-06-27 DIAGNOSIS — Z01818 Encounter for other preprocedural examination: Secondary | ICD-10-CM | POA: Insufficient documentation

## 2013-06-27 HISTORY — DX: Cardiac murmur, unspecified: R01.1

## 2013-06-27 HISTORY — DX: Adverse effect of unspecified anesthetic, initial encounter: T41.45XA

## 2013-06-27 HISTORY — DX: Unspecified asthma, uncomplicated: J45.909

## 2013-06-27 HISTORY — DX: Headache: R51

## 2013-06-27 HISTORY — DX: Other specified postprocedural states: Z98.890

## 2013-06-27 HISTORY — DX: Nausea with vomiting, unspecified: R11.2

## 2013-06-27 HISTORY — DX: Other complications of anesthesia, initial encounter: T88.59XA

## 2013-06-27 LAB — SURGICAL PCR SCREEN: Staphylococcus aureus: POSITIVE — AB

## 2013-06-27 NOTE — Progress Notes (Signed)
06-27-13 Need preop MD order entry in Epic. Pt. Here now for PAT visit.

## 2013-06-27 NOTE — Pre-Procedure Instructions (Addendum)
06-27-13 EKG/ CXR 9'14 Epic. 06-27-13 Note to Dr. Ermalene Searing office- Positive PCR screen-Staph aureus- will need Mupirocin Ointment .

## 2013-06-27 NOTE — Progress Notes (Signed)
06-27-13 1515 Pt. Has positive Staph aureus PCR screen-will need RX. For Mupirocin Ointnnet Called to [facility] [phone].

## 2013-06-27 NOTE — Patient Instructions (Signed)
20 Mary Davenport  06/27/2013   Your procedure is scheduled on: 11-11  -2014  Report to [facility] at      1115  AM.  Call this number if you have problems the morning of surgery: [phone]  Or Presurgical Testing [phone](Kuulei Kleier)   Remember: Follow any bowel prep instructions per MD office.   Do not eat food:After Midnight.  May have clear liquids:up to 6 Hours before arrival. Nothing after : 0700 am  Clear liquids include soda, tea, black coffee, apple or grape juice, broth.  Take these medicines the morning of surgery with A SIP OF WATER: none   Do not wear jewelry, make-up or nail polish.  Do not wear lotions, powders, or perfumes. You may wear deodorant.  Do not shave 12 hours prior to first CHG shower(legs and under arms).(face and neck okay.)  Do not bring valuables to the hospital.  Contacts, dentures or removable bridgework, body piercing, hair pins may not be worn into surgery.  Leave suitcase in the car. After surgery it may be brought to your room.  For patients admitted to the hospital, checkout time is 11:00 AM the day of discharge.   Patients discharged the day of surgery will not be allowed to drive home. Must have responsible person with you x 24 hours once discharged.  Name and phone number of your driver: Tim-spouse [phone] cell  Special Instructions: CHG(Chlorhedine 4%-"Hibiclens","Betasept","Aplicare") Shower Use Special Wash: see special instructions.(avoid face and genitals)   Please read over the following fact sheets that you were given: MRSA Information,Incentive Spirometry instruction.    Failure to follow these instructions may result in Cancellation of your surgery.   Patient signature_______________________________________________________

## 2013-06-28 ENCOUNTER — Other Ambulatory Visit: Payer: Self-pay

## 2013-06-29 ENCOUNTER — Encounter (INDEPENDENT_AMBULATORY_CARE_PROVIDER_SITE_OTHER): Payer: Self-pay | Admitting: Surgery

## 2013-06-29 ENCOUNTER — Ambulatory Visit (INDEPENDENT_AMBULATORY_CARE_PROVIDER_SITE_OTHER): Payer: BC Managed Care – PPO | Admitting: Surgery

## 2013-06-29 DIAGNOSIS — Z6841 Body Mass Index (BMI) 40.0 and over, adult: Secondary | ICD-10-CM

## 2013-06-29 NOTE — Progress Notes (Signed)
Chief Complaint:  Adult history of morbid obesity BMI 41  History of Present Illness:  Mary Davenport is an 37 y.o. female who has been to one of our seminars and desires gastric sleeve resection. She has done research on sleeve gastrectomy and has spoken to patients who have undergone this procedure. I reviewed the procedure and discussed its potential risk and complications and its potential benefits. She does have reflux disease and indicated that that would be something that could continue her be made worse with a sleeve hopefully we will be able to repair her hiatal hernia and treat that definitively. She takes omeprazole HS.  I reviewed her UGI and a tiny hiatal hernia was noted.  Her ultrasound was normal.    She is excited and ready to go ahead and get scheduled and again I think her fund of knowledge about sleeve gastrectomy is good.  For surgery next week.    Past Medical History  Diagnosis Date  . Endometriosis of ovary     more resolved since hysterectomy  . GERD (gastroesophageal reflux disease)   . Allergy     tx. occ. with OTC meds  . MRSA carrier   . Obesity   . Complication of anesthesia     coming out of anesthesia with D & C-combative,not breathing, when allowed to come out slower, this was not an issue  . PONV (postoperative nausea and vomiting)   . Heart murmur   . Asthma     has been controlled, not rescue inhaler at present  . Headache(784.0)     hx. migraines, none in 5 yrs  . Arthritis     feet, back, hips    Past Surgical History  Procedure Laterality Date  . Dilation and curettage of uterus    . Cholecystectomy      laparoscopic  . Abdominal hysterectomy      laparoscopic(endometriosis)  . Tonsillectomy      Current Outpatient Prescriptions  Medication Sig Dispense Refill  . omeprazole (PRILOSEC) 20 MG capsule Take 20 mg by mouth at bedtime.       . naproxen sodium (ANAPROX) 220 MG tablet Take 440 mg by mouth at bedtime.       . Pediatric  Multivit-Minerals-C (FLINTSTONES COMPLETE PO) Take 1 tablet by mouth at bedtime.       No current facility-administered medications for this visit.   Iodinated diagnostic agents; Lactose intolerance (gi); Shellfish allergy; Sulfa antibiotics; Whey protein; Erythromycin; and Sudafed Family History  Problem Relation Age of Onset  . Heart disease Mother   . Hyperlipidemia Mother   . Hypertension Mother    Social History:   reports that she has never smoked. She has never used smokeless tobacco. She reports that she does not drink alcohol or use illicit drugs.   REVIEW OF SYSTEMS - PERTINENT POSITIVES ONLY: Review of systems is negative in general, negative breast, negative infectious disease except for a history of being a MRSA carrier, negative for dental, negative cardiac, positive for asthma, normal endocrine, skin included fresh and easy bruisability, GI was positive for hiatal hernia or reflux, GU was normal, neurologic normal, hematologic was normal, new system was normal, HEENT exam is positive for sinus problems and runny nose and glasses or contacts. Musculoskeletal is positive for joint pain and arthritis. She is followed by Dr. Rob Ehinger.   She denies any problems with snoring or issues with structures sleep apnea.  Physical Exam:   Blood pressure 122/80, pulse 64, temperature   97.9 F (36.6 C), temperature source Temporal, resp. rate 14, height 5' 4" (1.626 m), weight 240 lb 3.2 oz (108.954 kg). Body mass index is 41.21 kg/(m^2).  Gen:  WDWN white female NAD  Neurological: Alert and oriented to person, place, and time. Motor and sensory function is grossly intact  Head: Normocephalic and atraumatic.  Eyes: Conjunctivae are normal. Pupils are equal, round, and reactive to light. No scleral icterus.  Neck: Normal range of motion. Neck supple. No tracheal deviation or thyromegaly present.  Cardiovascular:  SR without murmurs or gallops.  No carotid bruits Respiratory: Effort  normal.  No respiratory distress. No chest wall tenderness. Breath sounds normal.  No wheezes, rales or rhonchi.  Abdomen:  Obese nontender. She has scars reflecting a previous laparoscopic cholecystectomy and a robotic hysterectomy done by Dr. Cindy Romine.   GU: Musculoskeletal: Normal range of motion. Extremities are nontender. No cyanosis, edema or clubbing noted Lymphadenopathy: No cervical, preauricular, postauricular or axillary adenopathy is present Skin: Skin is warm and dry. No rash noted. No diaphoresis. No erythema. No pallor. Pscyh: Normal mood and affect. Behavior is normal. Judgment and thought content normal.   LABORATORY RESULTS: No results found for this or any previous visit (from the past 48 hour(s)).  RADIOLOGY RESULTS: No results found.  Problem List: Patient Active Problem List   Diagnosis Date Noted  . GERD (gastroesophageal reflux disease) 04/25/2013    Assessment & Plan: Morbid obesity with arthritis. We'll move toward a sleeve gastrectomy.    Matt B. Avrie Kedzierski, MD, FACS  [facility] [phone] beeper [phone]  06/29/2013 3:01 PM     

## 2013-06-29 NOTE — Patient Instructions (Signed)

## 2013-07-03 ENCOUNTER — Encounter (HOSPITAL_COMMUNITY)
Admission: RE | Disposition: A | Discharge status: Home / Self Care | Payer: Self-pay | Source: Ambulatory Visit | Attending: Surgery

## 2013-07-03 ENCOUNTER — Encounter (HOSPITAL_COMMUNITY): Payer: BC Managed Care – PPO | Admitting: Anesthesiology

## 2013-07-03 ENCOUNTER — Inpatient Hospital Stay (HOSPITAL_COMMUNITY): Payer: BC Managed Care – PPO | Admitting: Anesthesiology

## 2013-07-03 ENCOUNTER — Inpatient Hospital Stay (HOSPITAL_COMMUNITY)
Admission: RE | Admit: 2013-07-03 | Discharge: 2013-07-05 | DRG: 621 | Disposition: A | Discharge status: Home / Self Care | Payer: BC Managed Care – PPO | Source: Ambulatory Visit | Attending: Surgery | Admitting: Surgery

## 2013-07-03 ENCOUNTER — Encounter (HOSPITAL_COMMUNITY): Payer: Self-pay | Admitting: *Deleted

## 2013-07-03 DIAGNOSIS — K210 Gastro-esophageal reflux disease with esophagitis: Secondary | ICD-10-CM

## 2013-07-03 DIAGNOSIS — K449 Diaphragmatic hernia without obstruction or gangrene: Secondary | ICD-10-CM | POA: Diagnosis present

## 2013-07-03 DIAGNOSIS — M129 Arthropathy, unspecified: Secondary | ICD-10-CM | POA: Diagnosis present

## 2013-07-03 DIAGNOSIS — Z22322 Carrier or suspected carrier of Methicillin resistant Staphylococcus aureus: Secondary | ICD-10-CM

## 2013-07-03 DIAGNOSIS — K219 Gastro-esophageal reflux disease without esophagitis: Secondary | ICD-10-CM | POA: Diagnosis present

## 2013-07-03 DIAGNOSIS — Z91041 Radiographic dye allergy status: Secondary | ICD-10-CM

## 2013-07-03 DIAGNOSIS — Z8249 Family history of ischemic heart disease and other diseases of the circulatory system: Secondary | ICD-10-CM

## 2013-07-03 DIAGNOSIS — Z9884 Bariatric surgery status: Secondary | ICD-10-CM

## 2013-07-03 DIAGNOSIS — Z6841 Body Mass Index (BMI) 40.0 and over, adult: Secondary | ICD-10-CM

## 2013-07-03 DIAGNOSIS — Z01812 Encounter for preprocedural laboratory examination: Secondary | ICD-10-CM

## 2013-07-03 HISTORY — PX: LAPAROSCOPIC GASTRIC SLEEVE RESECTION: SHX5895

## 2013-07-03 HISTORY — PX: UPPER GI ENDOSCOPY: SHX6162

## 2013-07-03 LAB — CBC WITH DIFFERENTIAL/PLATELET
Basophils Absolute: 0 10*3/uL (ref 0.0–0.1)
Eosinophils Relative: 2 % (ref 0–5)
HCT: 35.3 % — ABNORMAL LOW (ref 36.0–46.0)
Hemoglobin: 12.5 g/dL (ref 12.0–15.0)
Lymphocytes Relative: 51 % — ABNORMAL HIGH (ref 12–46)
Lymphs Abs: 3.4 10*3/uL (ref 0.7–4.0)
MCV: 84.2 fL (ref 78.0–100.0)
Monocytes Absolute: 0.5 10*3/uL (ref 0.1–1.0)
Monocytes Relative: 8 % (ref 3–12)
Neutro Abs: 2.6 10*3/uL (ref 1.7–7.7)
RBC: 4.19 MIL/uL (ref 3.87–5.11)
RDW: 12.2 % (ref 11.5–15.5)
WBC: 6.7 10*3/uL (ref 4.0–10.5)

## 2013-07-03 LAB — COMPREHENSIVE METABOLIC PANEL
Alkaline Phosphatase: 87 U/L (ref 39–117)
BUN: 7 mg/dL (ref 6–23)
CO2: 23 mEq/L (ref 19–32)
Calcium: 9.5 mg/dL (ref 8.4–10.5)
GFR calc Af Amer: >90 mL/min (ref >90)
Glucose, Bld: 100 mg/dL — ABNORMAL HIGH (ref 70–99)
Potassium: 4.1 mEq/L (ref 3.5–5.1)
Sodium: 136 mEq/L (ref 135–145)
Total Protein: 7.3 g/dL (ref 6.0–8.3)

## 2013-07-03 LAB — CBC
MCH: 29.3 pg (ref 26.0–34.0)
MCHC: 34.2 g/dL (ref 30.0–36.0)
MCV: 85.6 fL (ref 78.0–100.0)
Platelets: 372 10*3/uL (ref 150–400)
RDW: 12.2 % (ref 11.5–15.5)

## 2013-07-03 SURGERY — GASTRECTOMY, SLEEVE, LAPAROSCOPIC
Anesthesia: General | Site: Esophagus | Wound class: Clean Contaminated

## 2013-07-03 MED ORDER — GLYCOPYRROLATE 0.2 MG/ML IJ SOLN
INTRAMUSCULAR | Status: DC | PRN
  Administered 2013-07-03: 0.2 mg via INTRAVENOUS
  Administered 2013-07-03: .6 mg via INTRAVENOUS

## 2013-07-03 MED ORDER — KETOROLAC TROMETHAMINE 30 MG/ML IJ SOLN
15.0000 mg | Freq: Once | INTRAMUSCULAR | Status: AC | PRN
  Administered 2013-07-03: 30 mg via INTRAVENOUS

## 2013-07-03 MED ORDER — SUCCINYLCHOLINE CHLORIDE 20 MG/ML IJ SOLN
INTRAMUSCULAR | Status: DC | PRN
  Administered 2013-07-03: 140 mg via INTRAVENOUS

## 2013-07-03 MED ORDER — LACTATED RINGERS IV SOLN
INTRAVENOUS | Status: DC
  Administered 2013-07-03: 16:00:00 via INTRAVENOUS
  Administered 2013-07-03: 1000 mL via INTRAVENOUS
  Administered 2013-07-03: 15:00:00 via INTRAVENOUS

## 2013-07-03 MED ORDER — PROMETHAZINE HCL 25 MG/ML IJ SOLN
6.2500 mg | INTRAMUSCULAR | Status: AC | PRN
  Administered 2013-07-03 (×2): 12.5 mg via INTRAVENOUS

## 2013-07-03 MED ORDER — HYDROMORPHONE HCL PF 1 MG/ML IJ SOLN
INTRAMUSCULAR | Status: DC | PRN
  Administered 2013-07-03 (×2): .4 mg via INTRAVENOUS

## 2013-07-03 MED ORDER — SCOPOLAMINE 1 MG/3DAYS TD PT72
1.0000 | MEDICATED_PATCH | TRANSDERMAL | Status: DC
  Administered 2013-07-03: 1.5 mg via TRANSDERMAL
  Filled 2013-07-03: qty 1

## 2013-07-03 MED ORDER — ONDANSETRON HCL 4 MG/2ML IJ SOLN
4.0000 mg | INTRAMUSCULAR | Status: DC | PRN
  Administered 2013-07-04 (×4): 4 mg via INTRAVENOUS
  Filled 2013-07-03 (×4): qty 2

## 2013-07-03 MED ORDER — PROMETHAZINE HCL 25 MG/ML IJ SOLN
INTRAMUSCULAR | Status: AC
  Filled 2013-07-03: qty 1

## 2013-07-03 MED ORDER — PROPOFOL 10 MG/ML IV BOLUS
INTRAVENOUS | Status: DC | PRN
  Administered 2013-07-03: 200 mg via INTRAVENOUS

## 2013-07-03 MED ORDER — LABETALOL HCL 5 MG/ML IV SOLN
INTRAVENOUS | Status: DC | PRN
  Administered 2013-07-03: 5 mg via INTRAVENOUS

## 2013-07-03 MED ORDER — FENTANYL CITRATE 0.05 MG/ML IJ SOLN
INTRAMUSCULAR | Status: DC | PRN
  Administered 2013-07-03 (×3): 100 ug via INTRAVENOUS
  Administered 2013-07-03: 50 ug via INTRAVENOUS

## 2013-07-03 MED ORDER — LACTATED RINGERS IR SOLN
Status: DC | PRN
  Administered 2013-07-03: 3000 mL

## 2013-07-03 MED ORDER — UNJURY VANILLA POWDER: 2.0000 [oz_av] | Freq: Four times a day (QID) | ORAL | Status: DC

## 2013-07-03 MED ORDER — NEOSTIGMINE METHYLSULFATE 1 MG/ML IJ SOLN
INTRAMUSCULAR | Status: DC | PRN
  Administered 2013-07-03: 4 mg via INTRAVENOUS

## 2013-07-03 MED ORDER — 0.9 % SODIUM CHLORIDE (POUR BTL) OPTIME
TOPICAL | Status: DC | PRN
  Administered 2013-07-03: 1000 mL

## 2013-07-03 MED ORDER — ESMOLOL HCL 10 MG/ML IV SOLN
INTRAVENOUS | Status: DC | PRN
  Administered 2013-07-03: 20 mg via INTRAVENOUS
  Administered 2013-07-03: 10 mg via INTRAVENOUS

## 2013-07-03 MED ORDER — TISSEEL VH 10 ML EX KIT
PACK | CUTANEOUS | Status: AC
  Filled 2013-07-03: qty 2

## 2013-07-03 MED ORDER — UNJURY CHICKEN SOUP POWDER: 2.0000 [oz_av] | Freq: Four times a day (QID) | ORAL | Status: DC

## 2013-07-03 MED ORDER — DEXTROSE 5 % IV SOLN
2.0000 g | INTRAVENOUS | Status: AC
  Administered 2013-07-03: 2 g via INTRAVENOUS
  Filled 2013-07-03: qty 2

## 2013-07-03 MED ORDER — EPHEDRINE SULFATE 50 MG/ML IJ SOLN
INTRAMUSCULAR | Status: DC | PRN
  Administered 2013-07-03: 15 mg via INTRAVENOUS

## 2013-07-03 MED ORDER — KCL IN DEXTROSE-NACL 20-5-0.45 MEQ/L-%-% IV SOLN
INTRAVENOUS | Status: DC
  Administered 2013-07-03 – 2013-07-05 (×5): via INTRAVENOUS
  Filled 2013-07-03 (×6): qty 1000

## 2013-07-03 MED ORDER — MUPIROCIN 2 % EX OINT
TOPICAL_OINTMENT | CUTANEOUS | Status: AC
  Filled 2013-07-03: qty 22

## 2013-07-03 MED ORDER — DEXAMETHASONE SODIUM PHOSPHATE 10 MG/ML IJ SOLN
INTRAMUSCULAR | Status: DC | PRN
  Administered 2013-07-03: 10 mg via INTRAVENOUS

## 2013-07-03 MED ORDER — HYDROMORPHONE HCL PF 1 MG/ML IJ SOLN
INTRAMUSCULAR | Status: AC
  Filled 2013-07-03: qty 1

## 2013-07-03 MED ORDER — HEPARIN SODIUM (PORCINE) 5000 UNIT/ML IJ SOLN
5000.0000 [IU] | Freq: Three times a day (TID) | INTRAMUSCULAR | Status: DC
  Administered 2013-07-03 – 2013-07-05 (×5): 5000 [IU] via SUBCUTANEOUS
  Filled 2013-07-03 (×8): qty 1

## 2013-07-03 MED ORDER — HYDROMORPHONE HCL PF 1 MG/ML IJ SOLN
0.2500 mg | INTRAMUSCULAR | Status: DC | PRN
  Administered 2013-07-03: 0.5 mg via INTRAVENOUS

## 2013-07-03 MED ORDER — HEPARIN SODIUM (PORCINE) 5000 UNIT/ML IJ SOLN
5000.0000 [IU] | INTRAMUSCULAR | Status: AC
  Administered 2013-07-03: 5000 [IU] via SUBCUTANEOUS
  Filled 2013-07-03: qty 1

## 2013-07-03 MED ORDER — MUPIROCIN 2 % EX OINT
TOPICAL_OINTMENT | Freq: Two times a day (BID) | CUTANEOUS | Status: DC
  Administered 2013-07-03: 1 via NASAL

## 2013-07-03 MED ORDER — ONDANSETRON HCL 4 MG/2ML IJ SOLN
INTRAMUSCULAR | Status: DC | PRN
  Administered 2013-07-03: 4 mg via INTRAVENOUS

## 2013-07-03 MED ORDER — MIDAZOLAM HCL 5 MG/5ML IJ SOLN
INTRAMUSCULAR | Status: DC | PRN
  Administered 2013-07-03: 2 mg via INTRAVENOUS

## 2013-07-03 MED ORDER — LIDOCAINE HCL (CARDIAC) 20 MG/ML IV SOLN
INTRAVENOUS | Status: DC | PRN
  Administered 2013-07-03: 100 mg via INTRAVENOUS

## 2013-07-03 MED ORDER — UNJURY CHOCOLATE CLASSIC POWDER: 2.0000 [oz_av] | Freq: Four times a day (QID) | ORAL | Status: DC

## 2013-07-03 MED ORDER — ROCURONIUM BROMIDE 100 MG/10ML IV SOLN
INTRAVENOUS | Status: DC | PRN
  Administered 2013-07-03: 40 mg via INTRAVENOUS
  Administered 2013-07-03: 10 mg via INTRAVENOUS

## 2013-07-03 MED ORDER — KETAMINE HCL 10 MG/ML IJ SOLN
INTRAMUSCULAR | Status: DC | PRN
  Administered 2013-07-03: 20 mg via INTRAVENOUS

## 2013-07-03 MED ORDER — TISSEEL VH 10 ML EX KIT
PACK | CUTANEOUS | Status: DC | PRN
  Administered 2013-07-03: 1

## 2013-07-03 MED ORDER — MORPHINE SULFATE 2 MG/ML IJ SOLN
2.0000 mg | INTRAMUSCULAR | Status: DC | PRN
  Administered 2013-07-03 – 2013-07-04 (×2): 4 mg via INTRAVENOUS
  Administered 2013-07-04: 2 mg via INTRAVENOUS
  Administered 2013-07-04: 4 mg via INTRAVENOUS
  Administered 2013-07-04: 2 mg via INTRAVENOUS
  Filled 2013-07-03: qty 1
  Filled 2013-07-03: qty 2
  Filled 2013-07-03: qty 1
  Filled 2013-07-03: qty 2
  Filled 2013-07-03: qty 1
  Filled 2013-07-03: qty 2

## 2013-07-03 MED ORDER — BUPIVACAINE LIPOSOME 1.3 % IJ SUSP
20.0000 mL | Freq: Once | INTRAMUSCULAR | Status: AC
  Administered 2013-07-03: 20 mL
  Filled 2013-07-03: qty 20

## 2013-07-03 MED ORDER — ACETAMINOPHEN 160 MG/5ML PO SOLN: 650.0000 mg | ORAL | Status: DC | PRN

## 2013-07-03 MED ORDER — KETOROLAC TROMETHAMINE 30 MG/ML IJ SOLN
INTRAMUSCULAR | Status: AC
  Filled 2013-07-03: qty 1

## 2013-07-03 SURGICAL SUPPLY — 59 items
ADH SKN CLS APL DERMABOND .7 (GAUZE/BANDAGES/DRESSINGS)
APL SRG 32X5 SNPLK LF DISP (MISCELLANEOUS) ×2
APPLICATOR COTTON TIP 6IN STRL (MISCELLANEOUS) IMPLANT
APPLIER CLIP ROT 10 11.4 M/L (STAPLE)
APR CLP MED LRG 11.4X10 (STAPLE)
BAG SPEC RTRVL LRG 6X4 10 (ENDOMECHANICALS)
CABLE HIGH FREQUENCY MONO STRZ (ELECTRODE) ×1 IMPLANT
CLIP APPLIE ROT 10 11.4 M/L (STAPLE) IMPLANT
DERMABOND ADVANCED (GAUZE/BANDAGES/DRESSINGS)
DERMABOND ADVANCED .7 DNX12 (GAUZE/BANDAGES/DRESSINGS) IMPLANT
DEVICE SUT QUICK LOAD TK 5 (STAPLE) ×1 IMPLANT
DEVICE SUT TI-KNOT TK 5X26 (MISCELLANEOUS) ×1 IMPLANT
DEVICE SUTURE ENDOST 10MM (ENDOMECHANICALS) ×1 IMPLANT
DEVICE TROCAR PUNCTURE CLOSURE (ENDOMECHANICALS) ×1 IMPLANT
DISSECTOR BLUNT TIP ENDO 5MM (MISCELLANEOUS) ×1 IMPLANT
DRAIN CHANNEL 19F RND (DRAIN) IMPLANT
DRAPE CAMERA CLOSED 9X96 (DRAPES) ×3 IMPLANT
ELECT REM PT RETURN 9FT ADLT (ELECTROSURGICAL) ×3
ELECTRODE REM PT RTRN 9FT ADLT (ELECTROSURGICAL) ×2 IMPLANT
EVACUATOR SILICONE 100CC (DRAIN) IMPLANT
GLOVE BIOGEL M 8.0 STRL (GLOVE) ×3 IMPLANT
GOWN STRL REIN XL XLG (GOWN DISPOSABLE) ×17 IMPLANT
HOVERMATT SINGLE USE (MISCELLANEOUS) ×3 IMPLANT
KIT BASIN OR (CUSTOM PROCEDURE TRAY) ×3 IMPLANT
MARKER SKIN DUAL TIP RULER LAB (MISCELLANEOUS) ×2 IMPLANT
NDL SPNL 22GX3.5 QUINCKE BK (NEEDLE) ×2 IMPLANT
NEEDLE SPNL 22GX3.5 QUINCKE BK (NEEDLE) ×3 IMPLANT
NS IRRIG 1000ML POUR BTL (IV SOLUTION) ×3 IMPLANT
PACK UNIVERSAL I (CUSTOM PROCEDURE TRAY) ×3 IMPLANT
PENCIL BUTTON HOLSTER BLD 10FT (ELECTRODE) ×3 IMPLANT
POUCH SPECIMEN RETRIEVAL 10MM (ENDOMECHANICALS) IMPLANT
RELOAD BLUE (STAPLE) ×9 IMPLANT
RELOAD ENDO STITCH (ENDOMECHANICALS) ×3 IMPLANT
RELOAD GREEN (STAPLE) ×5 IMPLANT
RELOAD SUT TRIPLE-STITCH 2-0 (ENDOMECHANICALS) IMPLANT
SCISSORS LAP 5X45 EPIX DISP (ENDOMECHANICALS) ×3 IMPLANT
SEALANT SURGICAL APPL DUAL CAN (MISCELLANEOUS) ×3 IMPLANT
SET IRRIG TUBING LAPAROSCOPIC (IRRIGATION / IRRIGATOR) ×3 IMPLANT
SHEARS CURVED HARMONIC AC 45CM (MISCELLANEOUS) ×3 IMPLANT
SLEEVE ENDOPATH XCEL 5M (ENDOMECHANICALS) ×2 IMPLANT
SLEEVE XCEL OPT CAN 5 100 (ENDOMECHANICALS) ×9 IMPLANT
SOLUTION ANTI FOG 6CC (MISCELLANEOUS) ×3 IMPLANT
SPONGE GAUZE 4X4 12PLY (GAUZE/BANDAGES/DRESSINGS) IMPLANT
SPONGE LAP 18X18 X RAY DECT (DISPOSABLE) ×3 IMPLANT
STAPLE ECHEON FLEX 60 POW ENDO (STAPLE) ×1 IMPLANT
SUT ETHILON 2 0 PS N (SUTURE) IMPLANT
SUT VIC AB 4-0 SH 18 (SUTURE) ×3 IMPLANT
SUT VICRYL 0 UR6 27IN ABS (SUTURE) ×1 IMPLANT
SYR 20CC LL (SYRINGE) ×6 IMPLANT
SYR 50ML LL SCALE MARK (SYRINGE) ×3 IMPLANT
TOWEL OR 17X26 10 PK STRL BLUE (TOWEL DISPOSABLE) ×3 IMPLANT
TOWEL OR NON WOVEN STRL DISP B (DISPOSABLE) ×6 IMPLANT
TRAY FOLEY CATH 14FRSI W/METER (CATHETERS) ×3 IMPLANT
TROCAR BLADELESS 15MM (ENDOMECHANICALS) ×3 IMPLANT
TROCAR XCEL 12X100 BLDLESS (ENDOMECHANICALS) ×3 IMPLANT
TROCAR XCEL NON-BLD 5MMX100MML (ENDOMECHANICALS) ×3 IMPLANT
TUBING CONNECTING 10 (TUBING) ×3 IMPLANT
TUBING ENDO SMARTCAP (MISCELLANEOUS) ×3 IMPLANT
TUBING FILTER THERMOFLATOR (ELECTROSURGICAL) ×3 IMPLANT

## 2013-07-03 NOTE — Op Note (Signed)
Name:  [name] MRN: [id number] Date of Surgery: 07/03/2013  Preop Diagnosis:  Morbid Obesity, S/P Gastric Sleeve  Postop Diagnosis:  Morbid Obesity, S/P Gastric Sleeve  Procedure:  Upper endoscopy  (Intraoperative)  Surgeon:  Ovidio Kin, M.D.  Anesthesia:  GET  Indications for procedure: Mary Davenport is a 37 y.o. female whose primary care physician is Thora Lance, MD and has completed a Gastric Sleeve today by Dr. Sheron Nightingale.  I am doing an intraoperative upper endoscopy to evaluate the gastric pouch.  Operative Note: The patient is under general anesthesia.  Dr. Daphine Deutscher is laparoscoping the patient while I do an upper endoscopy to evaluate the stomach pouch.  With the patient intubated, I passed the Pentax upper endoscope without difficulty down the esophagus.  The esophago-gastric junction was at 36 cm.    The mucosa of the stomach looked viable and the staple line was intact without bleeding.   There were multiple punctate suction areas in the pouch from the VisiGI tube.  She also had pseudopolyps noted.  I advanced to the pylorus, but did not go through it.  While I insufflated the stomach pouch with air, Dr. Daphine Deutscher flooded the upper abdomen with saline to put the gastric pouch under saline.  There was no bubbling or evidence of a leak.  Photos were taken of the gastric pouch.  The scope was then withdrawn.  The esophagus was unremarkable and the patient tolerated the endoscopy without difficulty.  Ovidio Kin, MD, [facility] Pager: [phone] Office phone:  [phone]

## 2013-07-03 NOTE — Interval H&P Note (Signed)
History and Physical Interval Note:  07/03/2013 1:35 PM  Mary Davenport  has presented today for surgery, with the diagnosis of morbid obesity  The various methods of treatment have been discussed with the patient and family. After consideration of risks, benefits and other options for treatment, the patient has consented to  Procedure(s): LAPAROSCOPIC GASTRIC SLEEVE RESECTION (N/A) as a surgical intervention .  The patient's history has been reviewed, patient examined, no change in status, stable for surgery.  I have reviewed the patient's chart and labs.  Questions were answered to the patient's satisfaction.     [name]

## 2013-07-03 NOTE — H&P (View-Only) (Signed)
Chief Complaint:  Adult history of morbid obesity BMI 41  History of Present Illness:  Mary Davenport is an 37 y.o. female who has been to one of our seminars and desires gastric sleeve resection. She has done research on sleeve gastrectomy and has spoken to patients who have undergone this procedure. I reviewed the procedure and discussed its potential risk and complications and its potential benefits. She does have reflux disease and indicated that that would be something that could continue her be made worse with a sleeve hopefully we will be able to repair her hiatal hernia and treat that definitively. She takes omeprazole HS.  I reviewed her UGI and a tiny hiatal hernia was noted.  Her ultrasound was normal.    She is excited and ready to go ahead and get scheduled and again I think her fund of knowledge about sleeve gastrectomy is good.  For surgery next week.    Past Medical History  Diagnosis Date  . Endometriosis of ovary     more resolved since hysterectomy  . GERD (gastroesophageal reflux disease)   . Allergy     tx. occ. with OTC meds  . MRSA carrier   . Obesity   . Complication of anesthesia     coming out of anesthesia with D & C-combative,not breathing, when allowed to come out slower, this was not an issue  . PONV (postoperative nausea and vomiting)   . Heart murmur   . Asthma     has been controlled, not rescue inhaler at present  . Headache(784.0)     hx. migraines, none in 5 yrs  . Arthritis     feet, back, hips    Past Surgical History  Procedure Laterality Date  . Dilation and curettage of uterus    . Cholecystectomy      laparoscopic  . Abdominal hysterectomy      laparoscopic(endometriosis)  . Tonsillectomy      Current Outpatient Prescriptions  Medication Sig Dispense Refill  . omeprazole (PRILOSEC) 20 MG capsule Take 20 mg by mouth at bedtime.       . naproxen sodium (ANAPROX) 220 MG tablet Take 440 mg by mouth at bedtime.       . Pediatric  Multivit-Minerals-C (FLINTSTONES COMPLETE PO) Take 1 tablet by mouth at bedtime.       No current facility-administered medications for this visit.   Iodinated diagnostic agents; Lactose intolerance (gi); Shellfish allergy; Sulfa antibiotics; Whey protein; Erythromycin; and Sudafed Family History  Problem Relation Age of Onset  . Heart disease Mother   . Hyperlipidemia Mother   . Hypertension Mother    Social History:   reports that she has never smoked. She has never used smokeless tobacco. She reports that she does not drink alcohol or use illicit drugs.   REVIEW OF SYSTEMS - PERTINENT POSITIVES ONLY: Review of systems is negative in general, negative breast, negative infectious disease except for a history of being a MRSA carrier, negative for dental, negative cardiac, positive for asthma, normal endocrine, skin included fresh and easy bruisability, GI was positive for hiatal hernia or reflux, GU was normal, neurologic normal, hematologic was normal, new system was normal, HEENT exam is positive for sinus problems and runny nose and glasses or contacts. Musculoskeletal is positive for joint pain and arthritis. She is followed by Dr. Jeanmarie Hubert.   She denies any problems with snoring or issues with structures sleep apnea.  Physical Exam:   Blood pressure 122/80, pulse 64, temperature  97.9 F (36.6 C), temperature source Temporal, resp. rate 14, height 5\' 4"  (1.626 m), weight 240 lb 3.2 oz (108.954 kg). Body mass index is 41.21 kg/(m^2).  Gen:  WDWN white female NAD  Neurological: Alert and oriented to person, place, and time. Motor and sensory function is grossly intact  Head: Normocephalic and atraumatic.  Eyes: Conjunctivae are normal. Pupils are equal, round, and reactive to light. No scleral icterus.  Neck: Normal range of motion. Neck supple. No tracheal deviation or thyromegaly present.  Cardiovascular:  SR without murmurs or gallops.  No carotid bruits Respiratory: Effort  normal.  No respiratory distress. No chest wall tenderness. Breath sounds normal.  No wheezes, rales or rhonchi.  Abdomen:  Obese nontender. She has scars reflecting a previous laparoscopic cholecystectomy and a robotic hysterectomy done by Dr. Purvis Sheffield.   GU: Musculoskeletal: Normal range of motion. Extremities are nontender. No cyanosis, edema or clubbing noted Lymphadenopathy: No cervical, preauricular, postauricular or axillary adenopathy is present Skin: Skin is warm and dry. No rash noted. No diaphoresis. No erythema. No pallor. Pscyh: Normal mood and affect. Behavior is normal. Judgment and thought content normal.   LABORATORY RESULTS: No results found for this or any previous visit (from the past 48 hour(s)).  RADIOLOGY RESULTS: No results found.  Problem List: Patient Active Problem List   Diagnosis Date Noted  . GERD (gastroesophageal reflux disease) 04/25/2013    Assessment & Plan: Morbid obesity with arthritis. We'll move toward a sleeve gastrectomy.    Matt B. Daphine Deutscher, MD, [facility] [phone] beeper [phone]  06/29/2013 3:01 PM

## 2013-07-03 NOTE — Transfer of Care (Signed)
Immediate Anesthesia Transfer of Care Note  Patient: Mary Davenport  Procedure(s) Performed: Procedure(s): LAPAROSCOPIC GASTRIC SLEEVE RESECTION (N/A) UPPER GI ENDOSCOPY  Patient Location: PACU  Anesthesia Type:General  Level of Consciousness: awake, alert  and oriented  Airway & Oxygen Therapy: Patient Spontanous Breathing and Patient connected to face mask oxygen  Post-op Assessment: Report given to PACU RN and Post -op Vital signs reviewed and stable  Post vital signs: Reviewed and stable  Complications: No apparent anesthesia complications

## 2013-07-03 NOTE — Preoperative (Signed)
Beta Blockers   Reason not to administer Beta Blockers:Not Applicable 

## 2013-07-03 NOTE — Anesthesia Postprocedure Evaluation (Signed)
  Anesthesia Post-op Note  Patient: Mary Davenport  Procedure(s) Performed: Procedure(s) (LRB): LAPAROSCOPIC GASTRIC SLEEVE RESECTION (N/A) UPPER GI ENDOSCOPY  Patient Location: PACU  Anesthesia Type: General  Level of Consciousness: awake and alert   Airway and Oxygen Therapy: Patient Spontanous Breathing  Post-op Pain: mild  Post-op Assessment: Post-op Vital signs reviewed, Patient's Cardiovascular Status Stable, Respiratory Function Stable, Patent Airway and No signs of Nausea or vomiting  Last Vitals:  Filed Vitals:   07/03/13 1700  BP: 128/71  Pulse: 67  Temp:   Resp: 13    Post-op Vital Signs: stable   Complications: No apparent anesthesia complications

## 2013-07-03 NOTE — Anesthesia Preprocedure Evaluation (Signed)
Anesthesia Evaluation  Patient identified by MRN, date of birth, ID band Patient awake    Reviewed: Allergy & Precautions, H&P , NPO status , Patient's Chart, lab work & pertinent test results  History of Anesthesia Complications (+) PONV  Airway Mallampati: II TM Distance: >3 FB Neck ROM: Full    Dental no notable dental hx.    Pulmonary asthma ,  breath sounds clear to auscultation  Pulmonary exam normal       Cardiovascular negative cardio ROS  Rhythm:Regular Rate:Normal     Neuro/Psych negative neurological ROS  negative psych ROS   GI/Hepatic Neg liver ROS, GERD-  Medicated,  Endo/Other  Morbid obesity  Renal/GU negative Renal ROS  negative genitourinary   Musculoskeletal negative musculoskeletal ROS (+)   Abdominal   Peds negative pediatric ROS (+)  Hematology negative hematology ROS (+)   Anesthesia Other Findings   Reproductive/Obstetrics negative OB ROS                           Anesthesia Physical Anesthesia Plan  ASA: II  Anesthesia Plan: General   Post-op Pain Management:    Induction: Intravenous  Airway Management Planned:   Additional Equipment:   Intra-op Plan:   Post-operative Plan: Extubation in OR  Informed Consent: I have reviewed the patients History and Physical, chart, labs and discussed the procedure including the risks, benefits and alternatives for the proposed anesthesia with the patient or authorized representative who has indicated his/her understanding and acceptance.   Dental advisory given  Plan Discussed with: CRNA and Surgeon  Anesthesia Plan Comments:         Anesthesia Quick Evaluation

## 2013-07-03 NOTE — Op Note (Signed)
Surgeon: Wenda Low, MD, FACS  Asst:  Ovidio Kin, MD, FACS  Anes:  general  Procedure: Laparoscopic sleeve gastrectomy with posterior repair of the hiatus hernia and endoscopy Ezzard Standing)  Diagnosis: Hiatus hernia with GERD and morbid obesity  Complications: none  EBL:   minimal cc  Description of Procedure:  The patient was taken to oh or 1 and given general anesthesia. The abdomen was prepped with PCMX and draped sterilely. A timeout was performed. Access to the abdomen was achieved through the left upper quadrant with a 0 5 mm Optiview technique without difficulty. Insufflation provided a good view of the abdomen and survey of the pelvis there were not many adhesions from her prior pelvic surgery.  Because of her small hiatal hernia in her history of reflux I went ahead and placed the [facility] retractor and exposed for foregut. There was a dimple anteriorly. I used the ViSiGi 36 Fr tube and not only calibrate the diaphragmatic closure. I did this by first measuring hour antrum in taking down the short gastrics the harmonic scalpel all the way up to the left crus. I then dissected the right side and identify the right and left crura place a single stitch which we then held until we pass the tube into the stomach. Once in place this was tied down with a Ty Knot.  Through the 12 mm port which was on the right side I began the sleeve first by passing the ViSiGi 36 Fr to into the antrum  And placing it on suction.  This provided good orientation of the anatomy and fixation enabled multiple firings of the echelon stapler beginning with a green load. Following that blue loads were used and we worked our way staying clear of the crows foot and then [employer]. To facilitate the last 2 firings staged the port on the left and passed a 15 trocar to complete the sleeve gastrectomy. We examined the posterior wall as well as the anterior wall of each firing try to keep our staple lines and a constant  plane. Once completed we insufflated using the tube did not see any bubbles. The tube was then withdrawn and Dr. Ezzard Standing performed an endoscopy which showed the tube to be wide open and no bleeding. She did have some little pseudo-polyps in the distal antrum probably from proton pump inhibitors. Port sites were all injected with Exparel. A large 15 which had been placed very obliquely was closed over the left with a single see suture of 0 Vicryl. Wounds were closed with 4-0 Vicryl and Dermabond. Patient our the procedure well taken recovery room in satisfactory condition.  Matt B. Daphine Deutscher, MD, [facility], [place] [phone]

## 2013-07-04 ENCOUNTER — Encounter (HOSPITAL_COMMUNITY): Payer: Self-pay | Admitting: Surgery

## 2013-07-04 ENCOUNTER — Inpatient Hospital Stay (HOSPITAL_COMMUNITY): Payer: BC Managed Care – PPO

## 2013-07-04 LAB — CBC WITH DIFFERENTIAL/PLATELET
Eosinophils Absolute: 0 10*3/uL (ref 0.0–0.7)
Eosinophils Relative: 0 % (ref 0–5)
Hemoglobin: 12.5 g/dL (ref 12.0–15.0)
Lymphocytes Relative: 14 % (ref 12–46)
Lymphs Abs: 1.4 10*3/uL (ref 0.7–4.0)
MCH: 29.1 pg (ref 26.0–34.0)
MCV: 84.8 fL (ref 78.0–100.0)
Monocytes Absolute: 0.5 10*3/uL (ref 0.1–1.0)
Monocytes Relative: 5 % (ref 3–12)
Platelets: 470 10*3/uL — ABNORMAL HIGH (ref 150–400)
RBC: 4.29 MIL/uL (ref 3.87–5.11)
WBC: 10.4 10*3/uL (ref 4.0–10.5)

## 2013-07-04 MED ORDER — PANTOPRAZOLE SODIUM 40 MG PO TBEC: 40.0000 mg | DELAYED_RELEASE_TABLET | Freq: Every day | ORAL | Status: DC

## 2013-07-04 MED ORDER — PANTOPRAZOLE SODIUM 40 MG PO PACK
40.0000 mg | PACK | Freq: Every day | ORAL | Status: DC
  Administered 2013-07-04: 40 mg via ORAL
  Filled 2013-07-04 (×3): qty 20

## 2013-07-04 NOTE — Care Management Note (Signed)
    Page 1 of 1   07/04/2013     9:08:21 AM   CARE MANAGEMENT NOTE 07/04/2013  Patient:  Mary Davenport, Mary Davenport   Account Number:  [id number]  Date Initiated:  07/04/2013  Documentation initiated by:  Lorenda Ishihara  Subjective/Objective Assessment:   37 yo female admitted s/p sleeve gastrectomy. PTA lived at home with spouse.     Action/Plan:   Home when stable   Anticipated DC Date:  07/06/2013   Anticipated DC Plan:  HOME/SELF CARE      DC Planning Services  CM consult      Choice offered to / List presented to:             Status of service:  Completed, signed off Medicare Important Message given?   (If response is "NO", the following Medicare IM given date fields will be blank) Date Medicare IM given:   Date Additional Medicare IM given:    Discharge Disposition:  HOME/SELF CARE  Per UR Regulation:  Reviewed for med. necessity/level of care/duration of stay  If discussed at Long Length of Stay Meetings, dates discussed:    Comments:

## 2013-07-04 NOTE — Progress Notes (Signed)
Patient ID: Mary Davenport, female   DOB: 03-Oct-1975, 36 y.o.   MRN: [id number] [facility] Progress Note:   1 Day Post-Op  Subjective: Mental status is clear.   Objective: Vital signs in last 24 hours: Temp:  [98 F (36.7 C)-99.7 F (37.6 C)] 99.4 F (37.4 C) (11/12 0520) Pulse Rate:  [62-100] 74 (11/12 0520) Resp:  [11-20] 16 (11/12 0520) BP: (110-172)/(67-93) 131/67 mmHg (11/12 0520) SpO2:  [93 %-100 %] 98 % (11/12 0520) Weight:  [238 lb 9.6 oz (108.228 kg)] 238 lb 9.6 oz (108.228 kg) (11/11 1134)  Intake/Output from previous day: 11/11 0701 - 11/12 0700 In: 3076.7 [I.V.:3076.7] Out: 1295 [Urine:1270; Blood:25] Intake/Output this shift: Total I/O In: -  Out: 625 [Urine:625]  Physical Exam: Work of breathing is normal.  Pain mainly at extraction site;  No redness  Lab Results:  Results for orders placed during the hospital encounter of 07/03/13 (from the past 48 hour(s))  CBC WITH DIFFERENTIAL     Status: Abnormal   Collection Time    07/03/13 12:00 PM      Result Value Range   WBC 6.7  4.0 - 10.5 K/uL   RBC 4.19  3.87 - 5.11 MIL/uL   Hemoglobin 12.5  12.0 - 15.0 g/dL   HCT 40.9 (*) 81.1 - 91.4 %   MCV 84.2  78.0 - 100.0 fL   MCH 29.8  26.0 - 34.0 pg   MCHC 35.4  30.0 - 36.0 g/dL   RDW 78.2  95.6 - 21.3 %   Platelets 433 (*) 150 - 400 K/uL   Neutrophils Relative % 39 (*) 43 - 77 %   Neutro Abs 2.6  1.7 - 7.7 K/uL   Lymphocytes Relative 51 (*) 12 - 46 %   Lymphs Abs 3.4  0.7 - 4.0 K/uL   Monocytes Relative 8  3 - 12 %   Monocytes Absolute 0.5  0.1 - 1.0 K/uL   Eosinophils Relative 2  0 - 5 %   Eosinophils Absolute 0.2  0.0 - 0.7 K/uL   Basophils Relative 0  0 - 1 %   Basophils Absolute 0.0  0.0 - 0.1 K/uL  COMPREHENSIVE METABOLIC PANEL     Status: Abnormal   Collection Time    07/03/13 12:00 PM      Result Value Range   Sodium 136  135 - 145 mEq/L   Potassium 4.1  3.5 - 5.1 mEq/L   Chloride 102  96 - 112 mEq/L   CO2 23  19 - 32 mEq/L   Glucose, Bld 100 (*) 70 - 99 mg/dL   BUN 7  6 - 23 mg/dL   Creatinine, Ser 0.86  0.50 - 1.10 mg/dL   Calcium 9.5  8.4 - 57.8 mg/dL   Total Protein 7.3  6.0 - 8.3 g/dL   Albumin 3.7  3.5 - 5.2 g/dL   AST 16  0 - 37 U/L   ALT 18  0 - 35 U/L   Alkaline Phosphatase 87  39 - 117 U/L   Total Bilirubin 0.4  0.3 - 1.2 mg/dL   GFR calc non Af Amer >90  >90 mL/min   GFR calc Af Amer >90  >90 mL/min   Comment: (NOTE)     The eGFR has been calculated using the CKD EPI equation.     This calculation has not been validated in all clinical situations.     eGFR's persistently <90 mL/min signify possible Chronic Kidney  Disease.  CBC     Status: Abnormal   Collection Time    07/03/13  7:10 PM      Result Value Range   WBC 13.4 (*) 4.0 - 10.5 K/uL   RBC 4.37  3.87 - 5.11 MIL/uL   Hemoglobin 12.8  12.0 - 15.0 g/dL   HCT 16.1  09.6 - 04.5 %   MCV 85.6  78.0 - 100.0 fL   MCH 29.3  26.0 - 34.0 pg   MCHC 34.2  30.0 - 36.0 g/dL   RDW 40.9  81.1 - 91.4 %   Platelets 372  150 - 400 K/uL  CREATININE, SERUM     Status: None   Collection Time    07/03/13  7:10 PM      Result Value Range   Creatinine, Ser 0.70  0.50 - 1.10 mg/dL   GFR calc non Af Amer >90  >90 mL/min   GFR calc Af Amer >90  >90 mL/min   Comment: (NOTE)     The eGFR has been calculated using the CKD EPI equation.     This calculation has not been validated in all clinical situations.     eGFR's persistently <90 mL/min signify possible Chronic Kidney     Disease.  CBC WITH DIFFERENTIAL     Status: Abnormal   Collection Time    07/04/13  4:15 AM      Result Value Range   WBC 10.4  4.0 - 10.5 K/uL   RBC 4.29  3.87 - 5.11 MIL/uL   Hemoglobin 12.5  12.0 - 15.0 g/dL   HCT 78.2  95.6 - 21.3 %   MCV 84.8  78.0 - 100.0 fL   MCH 29.1  26.0 - 34.0 pg   MCHC 34.3  30.0 - 36.0 g/dL   RDW 08.6  57.8 - 46.9 %   Platelets 470 (*) 150 - 400 K/uL   Neutrophils Relative % 82 (*) 43 - 77 %   Neutro Abs 8.5 (*) 1.7 - 7.7 K/uL   Lymphocytes  Relative 14  12 - 46 %   Lymphs Abs 1.4  0.7 - 4.0 K/uL   Monocytes Relative 5  3 - 12 %   Monocytes Absolute 0.5  0.1 - 1.0 K/uL   Eosinophils Relative 0  0 - 5 %   Eosinophils Absolute 0.0  0.0 - 0.7 K/uL   Basophils Relative 0  0 - 1 %   Basophils Absolute 0.0  0.0 - 0.1 K/uL    Radiology/Results: Dg Ugi W/kub  07/04/2013   CLINICAL DATA:  Status post sleeve gastrectomy and hernia repair  EXAM: UPPER GI SERIES W/ KUB  TECHNIQUE: After obtaining a scout radiograph a routine upper GI series was performed using thin barium. Thin barium was used as patient could history of iodinated contrast dye allergy on HSG.  COMPARISON:  Upper GI 05/15/2013  FLUOROSCOPY TIME:  1 min 36 seconds  FINDINGS: Contrast readily flows through the gastroesophageal junction into the gastric pouch. Contrast slowly flows through the pylorus into the small bowel. There is no evidence of extraluminal contrast to suggest leakage. There is some mild pooling along the inferior margin of the gastrjc body which is likely along the surgical staple line.  IMPRESSION: No evidence of obstruction or leak following sleeve gastrectomy and posterior hernia repair.  Mild pooling along the surgical staple line.  Findings conveyed toMATTHEW Daylani Deblois on 07/04/2013  at09:59.   Electronically Signed   By: Loura Halt.D.  On: 07/04/2013 09:59    Anti-infectives: Anti-infectives   Start     Dose/Rate Route Frequency Ordered Stop   07/03/13 1200  cefOXitin (MEFOXIN) 2 g in dextrose 5 % 50 mL IVPB     2 g 100 mL/hr over 30 Minutes Intravenous On call to O.R. 07/03/13 1137 07/03/13 1400      Assessment/Plan: Problem List: Patient Active Problem List   Diagnosis Date Noted  . 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 Morbid obesity 06/29/2013  . GERD (gastroesophageal reflux disease) 04/25/2013    Swallow looks ok.  Will begin PD one bariatric diet.   1 Day Post-Op    LOS: 1 day   Matt  B. Daphine Deutscher, MD, [facility] [phone] beeper [phone]  07/04/2013 10:52 AM

## 2013-07-04 NOTE — Progress Notes (Signed)
Bariatric Nurse Coordinator visit with patient, reinforced pulmonary toilet, and ambulation, addressed patient questions, all answered to patient satisfaction.  Will continue to monitor.

## 2013-07-04 NOTE — Progress Notes (Signed)
Patient with complaints of acid reflux, takes Prilosec at home.  Dr Biagio Quint paged for orders. Orders received

## 2013-07-05 LAB — CBC WITH DIFFERENTIAL/PLATELET
Basophils Relative: 0 % (ref 0–1)
Eosinophils Absolute: 0 10*3/uL (ref 0.0–0.7)
Eosinophils Relative: 0 % (ref 0–5)
HCT: 34.9 % — ABNORMAL LOW (ref 36.0–46.0)
Hemoglobin: 11.9 g/dL — ABNORMAL LOW (ref 12.0–15.0)
Lymphs Abs: 3.8 10*3/uL (ref 0.7–4.0)
MCH: 29.5 pg (ref 26.0–34.0)
MCHC: 34.1 g/dL (ref 30.0–36.0)
MCV: 86.6 fL (ref 78.0–100.0)
Monocytes Relative: 8 % (ref 3–12)
Neutrophils Relative %: 61 % (ref 43–77)
RBC: 4.03 MIL/uL (ref 3.87–5.11)

## 2013-07-05 MED ORDER — HYDROCODONE-ACETAMINOPHEN 7.5-325 MG/15ML PO SOLN: 15.0000 mL | Freq: Four times a day (QID) | ORAL | Status: DC | PRN

## 2013-07-05 MED ORDER — HYDROCODONE-ACETAMINOPHEN 7.5-325 MG/15ML PO SOLN
7.5000 mL | ORAL | Status: DC | PRN
  Administered 2013-07-05: 7.5 mL via ORAL
  Filled 2013-07-05: qty 15

## 2013-07-05 NOTE — Discharge Summary (Signed)
Physician Discharge Summary  Patient ID: Mary Davenport MRN: [id number] DOB/AGE: 25-Nov-1975 37 y.o.  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  PD swallow looked OK.  Started on liquids.  Observed.  Ready for discharge on 11/13.  Consults: none  Significant Diagnostic Studies: Dg Ugi W/kub  07/04/2013   CLINICAL DATA:  Status post sleeve gastrectomy and hernia repair  EXAM: UPPER GI SERIES W/ KUB  TECHNIQUE: After obtaining a scout radiograph a routine upper GI series was performed using thin barium. Thin barium was used as patient could history of iodinated contrast dye allergy on HSG.  COMPARISON:  Upper GI 05/15/2013  FLUOROSCOPY TIME:  1 min 36 seconds  FINDINGS: Contrast readily flows through the gastroesophageal junction into the gastric pouch. Contrast slowly flows through the pylorus into the small bowel. There is no evidence of extraluminal contrast to suggest leakage. There is some mild pooling along the inferior margin of the gastrjc body which is likely along the surgical staple line.  IMPRESSION: No evidence of obstruction or leak following sleeve gastrectomy and posterior hernia repair.  Mild pooling along the surgical staple line.  Findings conveyed toMATTHEW Broderick Fonseca on 07/04/2013  at09:59.   Electronically Signed   By: Genevive Bi M.D.   On: 07/04/2013 09:59      Discharge Exam: Blood pressure 123/77, pulse 86, temperature 99.4 F (37.4 C), temperature source Oral, resp. rate 18, height 5\' 4"  (1.626 m), weight 238 lb 9.6 oz (108.228 kg), SpO2 94.00%. Dermabond on incisions.  No significant abdominal pain.  Appears comfortable.  Nausea last night.  Taking liquids this am.  For discharge  today   Disposition:   Discharge Orders   Future Appointments Provider Department Dept Phone   07/17/2013 3:30 PM [facility] Post-Op Class [facility] [phone]   08/01/2013 2:15 PM Valarie Merino, MD [facility], [place] [phone]   Future Orders Complete By Expires   Call MD for:  persistant nausea and vomiting  As directed    Call MD for:  severe uncontrolled pain  As directed    Call MD for:  temperature >100.4  As directed    Discharge instructions  As directed    Comments:     Stay on prescribed bariatric diet   Increase activity slowly  As directed    No wound care  As directed        Medication List         FLINTSTONES COMPLETE PO  Take 1 tablet by mouth at bedtime.     HYDROcodone-acetaminophen 7.5-325 mg/15 ml solution  Commonly known as:  HYCET  Take 15 mLs by mouth 4 (four) times daily as needed for moderate pain.     naproxen sodium 220 MG tablet  Commonly known as:  ANAPROX  Take 440 mg by mouth at bedtime.     omeprazole 20 MG capsule  Commonly known as:  PRILOSEC  Take 20 mg by mouth at bedtime.           Follow-up Information   Follow up with Valarie Merino, MD.   Specialty:  General Surgery   Contact information:   [address] [place] [address] [phone]  SignedValarie Merino 07/05/2013, 8:54 AM

## 2013-07-05 NOTE — Progress Notes (Signed)
Patient alert and oriented, pain is controlled. Patient is tolerating fluids, plan to advance to protein shake today. Reviewed Gastric sleeve discharge instructions with patient and patient is able to articulate understanding. Provided information about support group, Offerings of [facility] out patient pharmacy and BELT programs

## 2013-07-10 ENCOUNTER — Telehealth: Payer: Self-pay

## 2013-07-10 NOTE — Telephone Encounter (Signed)
Patient call per having diarrhea for 2 days.  Encouraged patient to call the surgeon's office, and provided phone number.

## 2013-07-17 ENCOUNTER — Encounter: Payer: BC Managed Care – PPO | Attending: Surgery

## 2013-07-17 DIAGNOSIS — Z713 Dietary counseling and surveillance: Secondary | ICD-10-CM | POA: Insufficient documentation

## 2013-07-17 NOTE — Progress Notes (Signed)
Bariatric Class:  Appt start time: 1530 end time:  1630.  2 Week Post-Operative Nutrition Class  Patient was seen on 07/17/13 for Post-[facility]ative [facility]   Surgery date: 07/03/13 Surgery type: Sleeve Start weight at [facility]: 247.5 lbs (05/15/13) Weight today: 226.0lbs Weight lost: 21.5 lbs   TANITA  BODY COMP RESULTS  06/07/13 07/17/13   BMI (kg/m^2) 42.5 38.8   Fat Mass (lbs) 131.5 113.5   Fat Free Mass (lbs) 116.0 112.5   Total Body Water (lbs) 85.0 82.5   The following the learning objectives were met by the patient during this course:  Identifies Phase 3A (Soft, High Proteins) Dietary Goals and will begin from 2 weeks post-operatively to 2 months post-operatively  Identifies appropriate sources of fluids and proteins   States protein recommendations and appropriate sources post-operatively  Identifies the need for appropriate texture modifications, mastication, and bite sizes when consuming solids  Identifies appropriate multivitamin and calcium sources post-operatively  Describes the need for physical activity post-operatively and will follow MD recommendations  States when to call healthcare provider regarding medication questions or post-operative complications  Handouts given during class include:  Phase 3A: Soft, High Protein Diet Handout  Band Fill Guidelines Handout  Follow-Up Plan: Patient will follow-up at [facility] in 6 weeks for 8 week post-op nutrition visit for diet advancement per MD.

## 2013-07-17 NOTE — Patient Instructions (Signed)
Goals:  Follow Phase 3A: Soft High Protein Phase  Eat 3-6 small meals/snacks, every 3-5 hrs  Increase lean protein foods to meet 60g goal  Increase fluid intake to 64oz +  Avoid drinking 15 minutes before, during and 30 minutes after eating  Aim for >30 min of physical activity daily per MD 

## 2013-07-18 ENCOUNTER — Telehealth (INDEPENDENT_AMBULATORY_CARE_PROVIDER_SITE_OTHER): Payer: Self-pay

## 2013-07-18 NOTE — Telephone Encounter (Signed)
Pt calling in 15 days post lap gastric sleeve.  Pt states that she has not had a bowel movement since Saturday.  Advised pt to increase her water intake and add Mirilax.  Suggested 1-1 1/2 dose daily until she has a BM, then to adjust to her comfort level.  Pt asked what to do if she does not have a BM.  Advised to contact an urgent office, as our office will be closed for the holiday weekend.  Pt verbalized understanding and appreciation.

## 2013-08-01 ENCOUNTER — Encounter (INDEPENDENT_AMBULATORY_CARE_PROVIDER_SITE_OTHER): Payer: Self-pay | Admitting: Surgery

## 2013-08-01 ENCOUNTER — Ambulatory Visit (INDEPENDENT_AMBULATORY_CARE_PROVIDER_SITE_OTHER): Payer: BC Managed Care – PPO | Admitting: Surgery

## 2013-08-01 VITALS — BP 118/82 | HR 70 | Resp 18 | Ht 64.0 in | Wt 218.4 lb

## 2013-08-01 DIAGNOSIS — Z9884 Bariatric surgery status: Secondary | ICD-10-CM

## 2013-08-01 NOTE — Progress Notes (Signed)
Mary Davenport 37 y.o.  Body mass index is 37.47 kg/(m^2).  Patient Active Problem List   Diagnosis Date Noted  . 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 Morbid obesity 06/29/2013  . GERD (gastroesophageal reflux disease) 04/25/2013    Allergies  Allergen Reactions  . Iodinated Diagnostic Agents Hives  . Lactose Intolerance (Gi) Other (See Comments)    Severe abdominal pain and vomiting  . Shellfish Allergy Nausea And Vomiting  . Sulfa Antibiotics Hives  . Whey Protein [Protein] Other (See Comments)    Severe abdominal pain and vomiting  . Erythromycin Rash  . Sudafed [Pseudoephedrine] Palpitations    Past Surgical History  Procedure Laterality Date  . Dilation and curettage of uterus    . Cholecystectomy      laparoscopic  . Abdominal hysterectomy      laparoscopic(endometriosis)  . Tonsillectomy    . Laparoscopic gastric sleeve resection N/A 07/03/2013    Procedure: LAPAROSCOPIC GASTRIC SLEEVE RESECTION;  Surgeon: Valarie Merino, MD;  Location: [facility];  Service: General;  Laterality: N/A;  . Upper gi endoscopy  07/03/2013    Procedure: UPPER GI ENDOSCOPY;  Surgeon: Valarie Merino, MD;  Location: [facility];  Service: General;;   Thora Lance, MD No diagnosis found.  [name] is doing well after her sleeve gastrectomy 08/01/2013. She has lost about almost 22 pounds since surgery. She's not having any issues. Her incisions look good. I will see her back in about 2 months Matt B. Daphine Deutscher, MD, [facility] [phone] beeper [phone]  08/01/2013 3:12 PM

## 2013-08-01 NOTE — Patient Instructions (Signed)
Sleeve Gastrectomy, Care After Refer to this sheet in the next few weeks. These instructions provide you with information on caring for yourself after your procedure. Your surgeon may also give you more specific instructions. Your treatment has been planned according to current medical practices, but problems sometimes occur. Call your surgeon if you have any problems or questions after your procedure. HOME CARE INSTRUCTIONS  Get plenty of rest, but move around frequently for short periods or take short walks as directed by your surgeon. Increase your activities gradually.  Only take over-the-counter or prescription medicines as directed by your surgeon.  Keep incision areas clean and dry. Remove or change any bandages (dressings) only as directed by your surgeon. You may have skin adhesive strips or glue over the incision areas. Do not take the strips or the glue off. They will fall off on their own.  Check your incisions and surrounding areas daily for any redness, swelling, or drainage of fluid.  Take showers once your surgeon approves. Until then, only take sponge baths. Pat incisions dry. Do not rub incisions with a washcloth or towel. Do not take tub baths or go swimming until your surgeon approves. Do not put anything on your incision to clean it unless directed to do so by your surgeon.  Limit activities as directed by your surgeon. You will need to avoid strenuous activity, heavy lifting, and pushing or pulling things with your arms for several weeks. Do not lift anything heavier than 10 lb (4.5 kg).  Perform deep breathing exercises and coughing as directed by your surgeon. This helps prevent a lung infection.  Do not drive until your surgeon approves.  Follow all of the dietary instructions provided by your surgeon or dietitian. You will receive specific instructions on the type, size, and timing of meals.      You may need to stay on a liquid diet for some time after the  surgery.  Drink fluids frequently. You should drink 1 oz of fluid as often as you can.  Take vitamin, calcium, and protein supplements as directed by your surgeon.  If you have a drain from the incision area, make sure you:      Keep the area of the drain clean and dry.  Empty the drain and record the amount of fluid daily.  Talk with your surgeon about when you may return to work and about your exercise routine.   Keep all follow-up appointments with your surgeon and dietitian. SEEK MEDICAL CARE IF:  Your pain is not controlled with medicine.  You have a fever.  You have shaking chills.  You notice any redness, skin irritation, swelling, or drainage of fluid (other than light red) in the incision area.  Your drain gets pulled out accidentally.   Your drain contains bright red blood, green fluid, or fluid that has a foul smell. SEEK IMMEDIATE MEDICAL CARE IF:  You have difficulty breathing.  You have severe calf pain. MAKE SURE YOU:  Understand these instructions.  Will watch your condition.  Will get help right away if you are not doing well or get worse. Document Released: 06/05/2009 Document Revised: 04/11/2013 Document Reviewed: 12/22/2012 [employer] Patient Information 2014 [employer].  

## 2013-08-28 ENCOUNTER — Encounter: Payer: BC Managed Care – PPO | Attending: Surgery | Admitting: Dietician

## 2013-08-28 DIAGNOSIS — Z6835 Body mass index (BMI) 35.0-35.9, adult: Secondary | ICD-10-CM | POA: Insufficient documentation

## 2013-08-28 DIAGNOSIS — Z713 Dietary counseling and surveillance: Secondary | ICD-10-CM | POA: Insufficient documentation

## 2013-08-28 DIAGNOSIS — E669 Obesity, unspecified: Secondary | ICD-10-CM | POA: Insufficient documentation

## 2013-08-28 DIAGNOSIS — Z9889 Other specified postprocedural states: Secondary | ICD-10-CM | POA: Insufficient documentation

## 2013-08-28 NOTE — Patient Instructions (Signed)
Goals:  Follow Phase 3B: High Protein + Non-Starchy Vegetables  Eat 3-6 small meals/snacks, every 3-5 hrs  Increase lean protein foods to meet 60g goal  Increase fluid intake to 64oz +  Avoid drinking 15 minutes before, during and 30 minutes after eating  Aim for >30 min of physical activity daily

## 2013-08-28 NOTE — Progress Notes (Signed)
   Follow-up visit:  8 Weeks Post-Operative Sleeve Gastrectomy Surgery  Medical Nutrition Therapy:  Appt start time: 0945 end time:  9798.  Primary concerns today: Post-operative Bariatric Surgery Nutrition Management. Mary Davenport returns today with a 22 lbs weight loss. Tolerating all meat and having some constipation. Added some salad greens which has help with constipation. Does not eat lactose, shellfish, and soy.   Surgery date: 07/03/13 Surgery type: Sleeve Start weight at [facility]: 247.5 lbs (05/15/13) Weight today: 204.5 lbs Weight lost: 22 lbs, 17 lbs fat mass   TANITA  BODY COMP RESULTS  06/07/13 07/17/13 08/28/13   BMI (kg/m^2) 42.5 38.8 35.1   Fat Mass (lbs) 131.5 113.5 96.5   Fat Free Mass (lbs) 116.0 112.5 108.0   Total Body Water (lbs) 85.0 82.5 79.0    Preferred Learning Style:  No preference indicated   Learning Readiness:   Ready  24-hr recall: B (AM): 2 eggs or regular bacon, country ham 12-15 g  Snk (AM): none  L (PM): 3 oz grilled chicken 21g  Snk (PM): none  D (PM): 3 oz meat + green beans or salad 21g  Snk (PM): PB2 5 g   Fluid intake: 64 oz water or unsweet tea at a restaurant Estimated total protein intake: 55-65 g  Medications: see list Supplementation: taking  Using straws: Occassionally, having no gas pain Drinking while eating: No Hair loss: No Carbonated beverages: No N/V/D/C: having constipation, getting better since adding salad greens Dumping syndrome: maybe at first restaurant meal   Recent physical activity:  Planning start going to a gym, cleared to exercise   Progress Towards Goal(s):  In progress.  Handouts given during visit include:  Phase 3B High Protein and NS Vegetables   Nutritional Diagnosis:  [place]-3.3 Overweight/obesity related to past poor dietary habits and physical inactivity as evidenced by patient w/ recent sleeve gastrectomy surgery following dietary guidelines for continued weight loss.     Intervention:  Nutrition  eduction/diet advancement.  Teaching Method Utilized:  Visual Auditory  Barriers to learning/adherence to lifestyle change: none  Demonstrated degree of understanding via:  Teach Back   Monitoring/Evaluation:  Dietary intake, exercise, lap band fills, and body weight. Follow up in 1 months for 3 month post-op visit.

## 2013-09-11 ENCOUNTER — Encounter (INDEPENDENT_AMBULATORY_CARE_PROVIDER_SITE_OTHER): Payer: Self-pay | Admitting: Surgery

## 2013-10-09 ENCOUNTER — Ambulatory Visit: Payer: BC Managed Care – PPO | Admitting: Dietician

## 2013-10-29 ENCOUNTER — Encounter (INDEPENDENT_AMBULATORY_CARE_PROVIDER_SITE_OTHER): Payer: Self-pay | Admitting: Surgery

## 2013-10-29 ENCOUNTER — Ambulatory Visit (INDEPENDENT_AMBULATORY_CARE_PROVIDER_SITE_OTHER): Payer: BC Managed Care – PPO | Admitting: Surgery

## 2013-10-29 VITALS — BP 128/80 | HR 78 | Temp 97.8°F | Resp 16 | Ht 64.0 in | Wt 181.0 lb

## 2013-10-29 DIAGNOSIS — Z9884 Bariatric surgery status: Secondary | ICD-10-CM

## 2013-10-29 NOTE — Progress Notes (Signed)
Mary Davenport 38 y.o.  Body mass index is 31.05 kg/(m^2).  Patient Active Problem List   Diagnosis Date Noted  . 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 Morbid obesity 06/29/2013  . GERD (gastroesophageal reflux disease) 04/25/2013    Allergies  Allergen Reactions  . Iodinated Diagnostic Agents Hives  . Lactose Intolerance (Gi) Other (See Comments)    Severe abdominal pain and vomiting  . Shellfish Allergy Nausea And Vomiting  . Sulfa Antibiotics Hives  . Whey Protein [Protein] Other (See Comments)    Severe abdominal pain and vomiting  . Erythromycin Rash  . Sudafed [Pseudoephedrine] Palpitations    Past Surgical History  Procedure Laterality Date  . Dilation and curettage of uterus    . Cholecystectomy      laparoscopic  . Abdominal hysterectomy      laparoscopic(endometriosis)  . Tonsillectomy    . Laparoscopic gastric sleeve resection N/A 07/03/2013    Procedure: LAPAROSCOPIC GASTRIC SLEEVE RESECTION;  Surgeon: Pedro Earls, MD;  Location: [facility];  Service: General;  Laterality: N/A;  . Upper gi endoscopy  07/03/2013    Procedure: UPPER GI ENDOSCOPY;  Surgeon: Pedro Earls, MD;  Location: [facility];  Service: General;;   [name], MD No diagnosis found.  Doing very well.  Weight is down to 181.  No problems or complaints.  Will see her back in 6 months.   Matt B. Hassell Done, MD, [facility] [phone] beeper [phone]  10/29/2013 6:28 PM

## 2013-10-31 ENCOUNTER — Encounter: Payer: BC Managed Care – PPO | Attending: Surgery | Admitting: Dietician

## 2013-10-31 DIAGNOSIS — Z6835 Body mass index (BMI) 35.0-35.9, adult: Secondary | ICD-10-CM | POA: Insufficient documentation

## 2013-10-31 DIAGNOSIS — E669 Obesity, unspecified: Secondary | ICD-10-CM | POA: Insufficient documentation

## 2013-10-31 DIAGNOSIS — Z713 Dietary counseling and surveillance: Secondary | ICD-10-CM | POA: Insufficient documentation

## 2013-10-31 DIAGNOSIS — Z9889 Other specified postprocedural states: Secondary | ICD-10-CM | POA: Insufficient documentation

## 2013-10-31 NOTE — Patient Instructions (Addendum)
Goals:  Follow Phase 3B: High Protein + Non-Starchy Vegetables  Eat 3-6 small meals/snacks, every 3-5 hrs  Increase lean protein foods to meet 60g goal  Increase fluid intake to 64oz +  Avoid drinking 15 minutes before, during and 30 minutes after eating  Aim for >30 min of physical activity daily  If adding carbohydrates, limit to 15 g per meal or snack and have with protein   If you want to add a wrap, look for [employer] bread low carb (can be found at [place])

## 2013-10-31 NOTE — Progress Notes (Signed)
  Follow-up visit: 4 Months Post-Operative Sleeve Gastrectomy Surgery  Medical Nutrition Therapy:  Appt start time: 0830 end time:  900.  Primary concerns today: Post-operative Bariatric Surgery Nutrition Management. [place]ieny returns today with a 23 lbs weight loss. Tolerating all meat and having some constipation. Added some salad greens which has help with constipation. Does not eat lactose, shellfish, and soy.   Surgery date: 07/03/13 Surgery type: Sleeve Start weight at [facility]: 247.5 lbs (05/15/13) Weight today: 181.5 lbs  Weight lost: 23 lbs Total weight loss: 66 lbs Weight loss goal: 150 lbs % weight loss: 68%   TANITA  BODY COMP RESULTS  06/07/13 07/17/13 08/28/13 10/31/13   BMI (kg/m^2) 42.5 38.8 35.1 31.2   Fat Mass (lbs) 131.5 113.5 96.5 78.5   Fat Free Mass (lbs) 116.0 112.5 108.0 103.0   Total Body Water (lbs) 85.0 82.5 79.0 75.5    Preferred Learning Style:  No preference indicated   Learning Readiness:   Ready  24-hr recall: B (AM): 2 eggs or regular bacon, country ham 12-15 g  Snk (AM): none  L (PM): 3 oz grilled chicken 21g  Snk (PM): none  D (PM): 3 oz meat + green beans or salad 21g  Snk (PM): PB2 5 g   Fluid intake: 64 oz water or unsweet tea at a restaurant Estimated total protein intake: 55-65 g  Medications: see list Supplementation: taking  Using straws: Occassionally, having no gas pain Drinking while eating: No Hair loss: Yes Carbonated beverages: No N/V/D/C: vomited once after eating too fast, will feel nauseas if works out without eat Dumping syndrome: No   Recent physical activity:  3-5 week going to the gym - circuit training class, yoga/pilates class, for at least 1 hour  Progress Towards Goal(s):  In progress.  Nutritional Diagnosis:  [place]-3.3 Overweight/obesity related to past poor dietary habits and physical inactivity as evidenced by patient w/ recent sleeve gastrectomy surgery following dietary guidelines for continued weight loss.      Intervention:  Nutrition eduction/diet advancement.  Teaching Method Utilized:  Visual Auditory  Barriers to learning/adherence to lifestyle change: none  Demonstrated degree of understanding via:  Teach Back   Monitoring/Evaluation:  Dietary intake, exercise, lap band fills, and body weight. Follow up in 3 months for 7 month post-op visit.

## 2014-01-25 ENCOUNTER — Other Ambulatory Visit: Payer: Self-pay

## 2014-01-25 DIAGNOSIS — Z1231 Encounter for screening mammogram for malignant neoplasm of breast: Secondary | ICD-10-CM

## 2014-01-30 ENCOUNTER — Ambulatory Visit
Admission: RE | Admit: 2014-01-30 | Discharge: 2014-01-30 | Disposition: A | Discharge status: Home / Self Care | Payer: BC Managed Care – PPO | Source: Ambulatory Visit

## 2014-01-30 DIAGNOSIS — Z1231 Encounter for screening mammogram for malignant neoplasm of breast: Secondary | ICD-10-CM

## 2014-05-03 ENCOUNTER — Encounter: Payer: BC Managed Care – PPO | Attending: Surgery | Admitting: Dietician

## 2014-05-03 DIAGNOSIS — Z6827 Body mass index (BMI) 27.0-27.9, adult: Secondary | ICD-10-CM | POA: Insufficient documentation

## 2014-05-03 DIAGNOSIS — Z713 Dietary counseling and surveillance: Secondary | ICD-10-CM | POA: Diagnosis not present

## 2014-05-03 DIAGNOSIS — Z9884 Bariatric surgery status: Secondary | ICD-10-CM | POA: Insufficient documentation

## 2014-05-03 NOTE — Progress Notes (Signed)
  Follow-up visit: 4 Months Post-Operative Sleeve Gastrectomy Surgery  Medical Nutrition Therapy:  Appt start time: 0800 end time:  830.  Primary concerns today: Post-operative Bariatric Surgery Nutrition Management. Tiffieny returns today with a 20 lbs weight loss. Feels very close to goal. Has added carbs back and weight loss has slowed by not stopped. Not tolerating fruit too well.  Does not eat lactose, shellfish, and soy.   Work schedule has gotten busier and even lost weight while traveling on the road.   Surgery date: 07/03/13 Surgery type: Sleeve Start weight at [facility]: 247.5 lbs (05/15/13) Weight today: 161 lbs  Weight lost: 20.5 lbs Total weight loss: 86.5 lbs Weight loss goal: 155 lbs % weight loss: 94%   TANITA  BODY COMP RESULTS  06/07/13 07/17/13 08/28/13 10/31/13 05/03/14   BMI (kg/m^2) 42.5 38.8 35.1 31.2 27.6   Fat Mass (lbs) 131.5 113.5 96.5 78.5 61.0   Fat Free Mass (lbs) 116.0 112.5 108.0 103.0 100.0   Total Body Water (lbs) 85.0 82.5 79.0 75.5 73.0    Preferred Learning Style:  No preference indicated   Learning Readiness:   Ready  24-hr recall: B (AM): 2 eggs or regular bacon, country ham 12-15 g  Snk (AM): none  L (PM): 3 oz grilled chicken or ham or sandwich 21g  Snk (PM): none  D (PM): 3 oz meat + green beans or salad 21g  Snk (PM): PB2 5 g   Fluid intake: 64 oz water or unsweet tea at a restaurant and will have decaf coffee Estimated total protein intake: 55-65 g (some days better than other)  Medications: none Supplementation: taking  Using straws: No Drinking while eating: No Hair loss: Has resolved Carbonated beverages: No N/V/D/C: No Dumping syndrome: No   Recent physical activity:  2 week going to the gym - circuit training class, yoga/pilates class, for at least 1 hour  Progress Towards Goal(s):  In progress.  Nutritional Diagnosis:  [place]-3.3 Overweight/obesity related to past poor dietary habits and physical inactivity as evidenced  by patient w/ recent sleeve gastrectomy surgery following dietary guidelines for continued weight loss.     Intervention:  Nutrition eduction/diet advancement.  Teaching Method Utilized:  Visual Auditory  Barriers to learning/adherence to lifestyle change: none  Demonstrated degree of understanding via:  Teach Back   Monitoring/Evaluation:  Dietary intake, exercise, lap band fills, and body weight. Follow up prn.

## 2014-05-03 NOTE — Patient Instructions (Signed)
Goals:  Follow Phase 3B: High Protein + Non-Starchy Vegetables  Eat 3-6 small meals/snacks, every 3-5 hrs  Increase lean protein foods to meet 60g goal  Increase fluid intake to 64oz +  Avoid drinking 15 minutes before, during and 30 minutes after eating  Aim for >30 min of physical activity daily  If adding carbohydrates, limit to 15 g per meal or snack and have with protein

## 2014-09-02 ENCOUNTER — Other Ambulatory Visit: Payer: Self-pay | Admitting: Family Medicine

## 2014-09-02 ENCOUNTER — Ambulatory Visit
Admission: RE | Admit: 2014-09-02 | Discharge: 2014-09-02 | Disposition: A | Discharge status: Home / Self Care | Payer: BLUE CROSS/BLUE SHIELD | Source: Ambulatory Visit | Attending: Family Medicine | Admitting: Family Medicine

## 2014-09-02 DIAGNOSIS — R110 Nausea: Secondary | ICD-10-CM

## 2014-09-02 DIAGNOSIS — R1032 Left lower quadrant pain: Secondary | ICD-10-CM

## 2014-09-03 ENCOUNTER — Other Ambulatory Visit: Payer: Self-pay | Admitting: Family Medicine

## 2014-09-03 ENCOUNTER — Telehealth: Payer: Self-pay | Admitting: Obstetrics & Gynecology

## 2014-09-03 DIAGNOSIS — IMO0002 Reserved for concepts with insufficient information to code with codable children: Secondary | ICD-10-CM

## 2014-09-03 DIAGNOSIS — R229 Localized swelling, mass and lump, unspecified: Principal | ICD-10-CM

## 2014-09-03 DIAGNOSIS — N83209 Unspecified ovarian cyst, unspecified side: Secondary | ICD-10-CM

## 2014-09-03 NOTE — Telephone Encounter (Signed)
Spoke with patient. Patient states that she was seen yesterday with her PCP due to "severe abdominal pain that was radiating to my back. They thought it may be kidney stones so they did a CT scan which should a growth on my ovary. I am scheduled for an ultrasound tomorrow at [facility] but with this being gynologically related I wanted to check with Dr.Miller. I don't know if I should be seeing her instead or will need follow up with her." Patient requesting Dr.Miller take a look at CT results from 09/02/14. Advised patient will need to speak with Dr.Miller and have her review results. Will call patient with further recommendations. Patient is agreeable. Advised to keep appointment with [facility] for ultrasound tomorrow as planned for earliest evaluation. Patient is agreeable. CT scan results available in EPIC.

## 2014-09-03 NOTE — Telephone Encounter (Signed)
Spoke with patient. Advised spoke with Dr.Miller who would like to review results from ultrasound tomorrow and make further recommendations. Patient is agreeable.

## 2014-09-03 NOTE — Telephone Encounter (Signed)
Patient wants to talk with nurse no information given

## 2014-09-04 ENCOUNTER — Ambulatory Visit
Admission: RE | Admit: 2014-09-04 | Discharge: 2014-09-04 | Disposition: A | Discharge status: Home / Self Care | Payer: BLUE CROSS/BLUE SHIELD | Source: Ambulatory Visit | Attending: Family Medicine | Admitting: Family Medicine

## 2014-09-04 DIAGNOSIS — R229 Localized swelling, mass and lump, unspecified: Principal | ICD-10-CM

## 2014-09-04 DIAGNOSIS — IMO0002 Reserved for concepts with insufficient information to code with codable children: Secondary | ICD-10-CM

## 2014-09-04 NOTE — Telephone Encounter (Signed)
Dr. Sabra Heck, Results viewable in EPIC.

## 2014-09-04 NOTE — Telephone Encounter (Signed)
Patient had ultrasound and was told the results would not be released to the portal because her PCP was the ordering physician. Patient was told our office would need to call [place] [facility] and they would fax the result.

## 2014-09-05 NOTE — Telephone Encounter (Signed)
Patient calling requesting to speak with nurse on what to do next since she had her ultrasound.

## 2014-09-05 NOTE — Telephone Encounter (Signed)
Ultrasound findings are that it is a simple ovarian cyst.  Repeat PUS, here please, 3 months, to ensure resolution.

## 2014-09-05 NOTE — Telephone Encounter (Signed)
Routing to [place] for review and advise of ultrasound results in EPIC.

## 2014-09-06 NOTE — Addendum Note (Signed)
Addended by: Rolla Etienne E on: 09/06/2014 10:01 AM   Modules accepted: Orders

## 2014-09-06 NOTE — Telephone Encounter (Signed)
Spoke with patient. Advised patient of message as seen below from [place]. Patient is agreeable. 3 month follow up PUS scheduled for 4/7 at 4pm with Dr.Miller. Agreeable to date and time. Would like to schedule aex at this time as well. Aex scheduled for 1/16 at 1:30pm with Dr.Miller. Agreeable to date and time. "When I was at the doctor on Monday I was running a fever. Could this all be related? I am still having pain." Advised patient symptoms she was experiencing could be related to her cyst. Denies any fevers at this time. States her urine was checked at appointment and was clear. Advised to monitor symptoms and if they worsen or change will need to call our office to be seen or seek care at ER. Patient is agreeable.  Routing to provider for final review. Patient agreeable to disposition. Will close encounter

## 2014-09-16 ENCOUNTER — Telehealth: Payer: Self-pay | Admitting: Obstetrics & Gynecology

## 2014-09-16 NOTE — Telephone Encounter (Signed)
Spoke with patient. Advised that per benefit quote received, she will be responsible to pay $92.48 when she comes in for PUS.  Patient agreeable.

## 2014-10-07 ENCOUNTER — Encounter: Payer: Self-pay | Admitting: Obstetrics & Gynecology

## 2014-10-08 ENCOUNTER — Ambulatory Visit (INDEPENDENT_AMBULATORY_CARE_PROVIDER_SITE_OTHER): Payer: BLUE CROSS/BLUE SHIELD | Admitting: Obstetrics & Gynecology

## 2014-10-08 ENCOUNTER — Encounter: Payer: Self-pay | Admitting: Obstetrics & Gynecology

## 2014-10-08 VITALS — BP 118/78 | HR 60 | Resp 16 | Ht 64.0 in | Wt 162.4 lb

## 2014-10-08 DIAGNOSIS — N839 Noninflammatory disorder of ovary, fallopian tube and broad ligament, unspecified: Secondary | ICD-10-CM

## 2014-10-08 DIAGNOSIS — N83202 Unspecified ovarian cyst, left side: Secondary | ICD-10-CM

## 2014-10-08 DIAGNOSIS — Z01419 Encounter for gynecological examination (general) (routine) without abnormal findings: Secondary | ICD-10-CM

## 2014-10-08 DIAGNOSIS — Z0000 Encounter for general adult medical examination without abnormal findings: Secondary | ICD-10-CM

## 2014-10-08 DIAGNOSIS — N838 Other noninflammatory disorders of ovary, fallopian tube and broad ligament: Secondary | ICD-10-CM

## 2014-10-08 DIAGNOSIS — N8320 Unspecified ovarian cysts: Secondary | ICD-10-CM

## 2014-10-08 LAB — POCT URINALYSIS DIPSTICK
Bilirubin, UA: NEGATIVE
Blood, UA: NEGATIVE
Glucose, UA: NEGATIVE
Ketones, UA: NEGATIVE
LEUKOCYTES UA: NEGATIVE
Nitrite, UA: NEGATIVE
PROTEIN UA: NEGATIVE
UROBILINOGEN UA: NEGATIVE
pH, UA: 6.5

## 2014-10-08 LAB — HEMOGLOBIN, FINGERSTICK: HEMOGLOBIN, FINGERSTICK: 14.5 g/dL (ref 12.0–16.0)

## 2014-10-08 NOTE — Addendum Note (Signed)
Addended by: Robley Fries on: 10/08/2014 03:26 PM   Modules accepted: Miquel Dunn

## 2014-10-08 NOTE — Progress Notes (Signed)
39 y.o. G3P2 MarriedCaucasianF here for annual exam.  Reports she had episode of significant LLQ pain.  Saw Dr. Marisue Humble and he thought it was a kidney stone.  CT was same day (09/02/14).  Showed 3cm left ovarian cyst.  Ultrasound was done /13/16 showing 4 x 3 x 5cm ovary with 3 x 2.6 x 2.7cm simple ovarian cyst.  Reviewed findings with pt.  She does have follow up ultrasound scheduled in 12 weeks.  Pt very anxious.  Feel this is benign and a functional ovarian cyst.  Will plan ca-125 today.   Denies vaginal bleeding.    Had gastric sleeve 11/14 with Dr. Hassell Done.  She has lost about 100 pounds and done so very well since her surgery.  Had labs a little over a year ago.    No LMP recorded. Patient has had a hysterectomy.          Sexually active: Yes.    The current method of family planning is status post hysterectomy.    Exercising: No.  not regularly Smoker:  no  Health Maintenance: Pap:  04/09/09 WNL History of abnormal Pap:  no MMG:  01/30/14 3D-normal Colonoscopy:  none BMD:   none TDaP:  2014 Screening Labs: PCp, Hb today: PCP, Urine today: PCP   reports that she has never smoked. She has never used smokeless tobacco. She reports that she does not drink alcohol or use illicit drugs.  Past Medical History  Diagnosis Date  . Endometriosis of ovary     more resolved since hysterectomy  . GERD (gastroesophageal reflux disease)   . Allergy     tx. occ. with OTC meds  . MRSA carrier   . Obesity   . Complication of anesthesia     coming out of anesthesia with D & C-combative,not breathing, when allowed to come out slower, this was not an issue  . PONV (postoperative nausea and vomiting)   . Heart murmur   . Asthma     has been controlled, not rescue inhaler at present  . Headache(784.0)     hx. migraines, none in 5 yrs  . Arthritis     feet, back, hips  . Hiatal hernia   . Plantar fasciitis 2009  . Carpal tunnel syndrome   . Depression   . Ovarian cyst     left     Past  Surgical History  Procedure Laterality Date  . Dilation and curettage of uterus    . Cholecystectomy      laparoscopic  . Abdominal hysterectomy      laparoscopic(endometriosis)  . Tonsillectomy    . Laparoscopic gastric sleeve resection N/A 07/03/2013    Procedure: LAPAROSCOPIC GASTRIC SLEEVE RESECTION;  Surgeon: Pedro Earls, MD;  Location: [facility];  Service: General;  Laterality: N/A;  . Upper gi endoscopy  07/03/2013    Procedure: UPPER GI ENDOSCOPY;  Surgeon: Pedro Earls, MD;  Location: [facility];  Service: General;;  . Diagnostic laparoscopy  8/96    lysis/endometriosis (LKS)    Current Outpatient Prescriptions  Medication Sig Dispense Refill  . Pediatric Multivit-Minerals-C (FLINTSTONES COMPLETE PO) Take 1 tablet by mouth at bedtime.     No current facility-administered medications for this visit.    Family History  Problem Relation Age of Onset  . Heart disease Mother   . Hyperlipidemia Mother   . Hypertension Mother   . Diabetes Maternal Grandmother   . Breast cancer Other     maternal great aunt  .  Hypertension Maternal Grandmother   . Heart disease Maternal Grandmother   . Heart disease Maternal Grandfather     ROS:  Pertinent items are noted in HPI.  Otherwise, a comprehensive ROS was negative.  Exam:   BP 118/78 mmHg  Pulse 60  Resp 16  Ht 5' 4"  (1.626 m)  Wt 162 lb 6.4 oz (73.664 kg)  BMI 27.86 kg/m2  Weight change: -98#   Height: 5' 4"  (162.6 cm)  Ht Readings from Last 3 Encounters:  10/08/14 5' 4"  (1.626 m)  05/03/14 5' 4"  (1.626 m)  10/31/13 5' 4"  (1.626 m)    General appearance: alert, cooperative and appears stated age Head: Normocephalic, without obvious abnormality, atraumatic Neck: no adenopathy, supple, symmetrical, trachea midline and thyroid normal to inspection and palpation Lungs: clear to auscultation bilaterally Breasts: normal appearance, no masses or tenderness Heart: regular rate and rhythm Abdomen: soft, non-tender; bowel  sounds normal; no masses,  no organomegaly Extremities: extremities normal, atraumatic, no cyanosis or edema Skin: Skin color, texture, turgor normal. No rashes or lesions Lymph nodes: Cervical, supraclavicular, and axillary nodes normal. No abnormal inguinal nodes palpated Neurologic: Grossly normal   Pelvic: External genitalia:  no lesions              Urethra:  normal appearing urethra with no masses, tenderness or lesions              Bartholins and Skenes: normal                 Vagina: normal appearing vagina with normal color and discharge, no lesions              Cervix: absent              Pap taken: No. Bimanual Exam:  Uterus:  uterus absent              Adnexa: fullness on left, mild tenderness               Rectovaginal: Confirms               Anus:  normal sphincter tone, no lesions  Chaperone was present for exam.  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  D/W pt current guidelines.  She feels comfortable with repeating in 2 years. pap smear not indicated Repeat PUS 4/16.  This is already scheduled. Ca-125 today return annually or prn

## 2014-10-09 LAB — CA 125: CA 125: 4 U/mL (ref <35)

## 2014-11-28 ENCOUNTER — Other Ambulatory Visit: Payer: BLUE CROSS/BLUE SHIELD

## 2014-11-28 ENCOUNTER — Other Ambulatory Visit: Payer: BLUE CROSS/BLUE SHIELD | Admitting: Obstetrics & Gynecology

## 2014-12-02 ENCOUNTER — Telehealth: Payer: Self-pay | Admitting: Obstetrics & Gynecology

## 2014-12-02 NOTE — Telephone Encounter (Signed)
Pt need to reschedule her ultrasound appointment.

## 2014-12-02 NOTE — Telephone Encounter (Signed)
Called patient and rescheduled ultrasound appointment for 12/12/14. Patient advised of ultrasound cancellation policy. Routing to provider for final review. Patient agreeable to disposition. Will close encounter

## 2014-12-05 ENCOUNTER — Other Ambulatory Visit: Payer: BLUE CROSS/BLUE SHIELD | Admitting: Obstetrics & Gynecology

## 2014-12-05 ENCOUNTER — Other Ambulatory Visit: Payer: BLUE CROSS/BLUE SHIELD

## 2014-12-12 ENCOUNTER — Ambulatory Visit (INDEPENDENT_AMBULATORY_CARE_PROVIDER_SITE_OTHER): Payer: BLUE CROSS/BLUE SHIELD | Admitting: Obstetrics & Gynecology

## 2014-12-12 ENCOUNTER — Encounter: Payer: Self-pay | Admitting: Obstetrics & Gynecology

## 2014-12-12 ENCOUNTER — Ambulatory Visit (INDEPENDENT_AMBULATORY_CARE_PROVIDER_SITE_OTHER): Payer: BLUE CROSS/BLUE SHIELD

## 2014-12-12 VITALS — BP 102/70 | Wt 157.0 lb

## 2014-12-12 DIAGNOSIS — N8320 Unspecified ovarian cysts: Secondary | ICD-10-CM | POA: Diagnosis not present

## 2014-12-12 DIAGNOSIS — N83201 Unspecified ovarian cyst, right side: Secondary | ICD-10-CM

## 2014-12-12 DIAGNOSIS — N83209 Unspecified ovarian cyst, unspecified side: Secondary | ICD-10-CM

## 2014-12-12 NOTE — Progress Notes (Signed)
39 y.o. G3P2 Married Caucasian female here for a pelvic ultrasound to recheck ovarian cyst that was seen on CT scan done 09/02/14.  Subsequent ultrasound was done 09/04/14 showing a 3.1cm cyst of left ovary/left adnexa.  There was a question on this ultrasound regarding whether this was and actual ovarian cyst or an adnexal cyst.  Pt denies pain.  Continuing to lose weight.      No LMP recorded. Patient has had a hysterectomy.  Sexually active:  yes  Contraception: hysterectomy  FINDINGS: UTERUS: surgically absent ADNEXA:  Left ovary 1.7 x 1.0 x 1.0cm with no cyst    Right ovary 1.8 x 1.4 x 1.4cm with collapsing corpus luteal cyst CUL DE SAC: no free fluid  Reviewed prior images on PACS system and compared to images from today.  Left cyst noted on ultrasound has resolved.  She does have a corpus luteal cyst on the right side.  Pt has some concerns about "another cyst" but explained this is part of normal ovarian function and these are very common.  She is young enough that she should still have ovulation and, in fact, with her weight loss over the past year and a half, this may be even more regular occurrence for her.  Pt reassured by this.  No additional ultrasound evaluation recommended unless she has new symptoms or findings on exam.  Assessment:  Resolved left ovarian cyst New right corpus luteal cyst  Plan: Follow up for AEX.  No additional ultrasounds needed.  ~15 minutes spent with patient >50% of time was in face to face discussion of above.

## 2015-12-12 ENCOUNTER — Ambulatory Visit (INDEPENDENT_AMBULATORY_CARE_PROVIDER_SITE_OTHER): Payer: BLUE CROSS/BLUE SHIELD | Admitting: Obstetrics & Gynecology

## 2015-12-12 ENCOUNTER — Encounter: Payer: Self-pay | Admitting: Obstetrics & Gynecology

## 2015-12-12 VITALS — BP 110/66 | HR 62 | Resp 16 | Ht 63.5 in | Wt 170.0 lb

## 2015-12-12 DIAGNOSIS — E559 Vitamin D deficiency, unspecified: Secondary | ICD-10-CM | POA: Diagnosis not present

## 2015-12-12 DIAGNOSIS — Z0000 Encounter for general adult medical examination without abnormal findings: Secondary | ICD-10-CM | POA: Diagnosis not present

## 2015-12-12 DIAGNOSIS — Z01419 Encounter for gynecological examination (general) (routine) without abnormal findings: Secondary | ICD-10-CM | POA: Diagnosis not present

## 2015-12-12 LAB — LIPID PANEL
CHOL/HDL RATIO: 2.5 ratio (ref <=5.0)
Cholesterol: 148 mg/dL (ref 125–200)
HDL: 59 mg/dL (ref >=46)
LDL CALC: 78 mg/dL (ref <130)
Triglycerides: 53 mg/dL (ref <150)
VLDL: 11 mg/dL (ref <30)

## 2015-12-12 LAB — POCT URINALYSIS DIPSTICK
Bilirubin, UA: NEGATIVE
Blood, UA: NEGATIVE
Glucose, UA: NEGATIVE
Ketones, UA: NEGATIVE
Leukocytes, UA: NEGATIVE
Nitrite, UA: NEGATIVE
PH UA: 5
PROTEIN UA: NEGATIVE
Urobilinogen, UA: NEGATIVE

## 2015-12-12 LAB — COMPREHENSIVE METABOLIC PANEL
ALT: 10 U/L (ref 6–29)
AST: 14 U/L (ref 10–30)
Albumin: 3.9 g/dL (ref 3.6–5.1)
Alkaline Phosphatase: 66 U/L (ref 33–115)
BUN: 11 mg/dL (ref 7–25)
CHLORIDE: 102 mmol/L (ref 98–110)
CO2: 29 mmol/L (ref 20–31)
CREATININE: 0.73 mg/dL (ref 0.50–1.10)
Calcium: 9.5 mg/dL (ref 8.6–10.2)
GLUCOSE: 76 mg/dL (ref 65–99)
POTASSIUM: 4.1 mmol/L (ref 3.5–5.3)
Sodium: 139 mmol/L (ref 135–146)
Total Bilirubin: 0.4 mg/dL (ref 0.2–1.2)
Total Protein: 6.8 g/dL (ref 6.1–8.1)

## 2015-12-12 LAB — CBC
HCT: 37.8 % (ref 35.0–45.0)
Hemoglobin: 12.7 g/dL (ref 11.7–15.5)
MCH: 30 pg (ref 27.0–33.0)
MCHC: 33.6 g/dL (ref 32.0–36.0)
MCV: 89.4 fL (ref 80.0–100.0)
MPV: 9.6 fL (ref 7.5–12.5)
Platelets: 388 10*3/uL (ref 140–400)
RBC: 4.23 MIL/uL (ref 3.80–5.10)
RDW: 13.2 % (ref 11.0–15.0)
WBC: 5.7 10*3/uL (ref 3.8–10.8)

## 2015-12-12 LAB — TSH: TSH: 1.23 m[IU]/L

## 2015-12-12 MED ORDER — TRIAMCINOLONE ACETONIDE 0.5 % EX CREA: 1.0000 "application " | TOPICAL_CREAM | Freq: Three times a day (TID) | CUTANEOUS | Status: DC

## 2015-12-12 NOTE — Progress Notes (Signed)
40 y.o. G3P2 MarriedCaucasianF here for annual exam.  Doing well.  No vaginal bleeding.  Oldest is graduating from high school.  They are considering going to [place] this summer.    No LMP recorded. Patient has had a hysterectomy.          Sexually active: Yes.    The current method of family planning is status post hysterectomy.    Exercising: No.  The patient does not participate in regular exercise at present. Smoker:  no  Health Maintenance: Pap:  04/09/2009 Normal  History of abnormal Pap:  no MMG:  02/05/14 BIRADS1:Neg Colonoscopy: Never BMD:   Never TDaP:  2014 Screening Labs: Here, Hb today: pending, Urine today: pending   reports that she has never smoked. She has never used smokeless tobacco. She reports that she does not drink alcohol or use illicit drugs.  Past Medical History  Diagnosis Date  . Endometriosis of ovary     more resolved since hysterectomy  . GERD (gastroesophageal reflux disease)   . Allergy     tx. occ. with OTC meds  . MRSA carrier   . Obesity   . Complication of anesthesia     coming out of anesthesia with D & C-combative,not breathing, when allowed to come out slower, this was not an issue  . PONV (postoperative nausea and vomiting)   . Heart murmur   . Asthma     has been controlled, not rescue inhaler at present  . Headache(784.0)     hx. migraines, none in 5 yrs  . Arthritis     feet, back, hips  . Hiatal hernia   . Plantar fasciitis 2009  . Carpal tunnel syndrome   . Depression   . Ovarian cyst     left     Past Surgical History  Procedure Laterality Date  . Dilation and curettage of uterus    . Cholecystectomy      laparoscopic  . Abdominal hysterectomy      laparoscopic(endometriosis)  . Tonsillectomy    . Laparoscopic gastric sleeve resection N/A 07/03/2013    Procedure: LAPAROSCOPIC GASTRIC SLEEVE RESECTION;  Surgeon: Pedro Earls, MD;  Location: [facility];  Service: General;  Laterality: N/A;  . Upper gi endoscopy   07/03/2013    Procedure: UPPER GI ENDOSCOPY;  Surgeon: Pedro Earls, MD;  Location: [facility];  Service: General;;  . Diagnostic laparoscopy  8/96    lysis/endometriosis ([facility]KS)    Current Outpatient Prescriptions  Medication Sig Dispense Refill  . cetirizine (ZYRTEC) 10 MG tablet Take 10 mg by mouth daily.    . Pediatric Multivit-Minerals-C (FLINTSTONES COMPLETE PO) Take 1 tablet by mouth at bedtime.     No current facility-administered medications for this visit.    Family History  Problem Relation Age of Onset  . Heart disease Mother   . Hyperlipidemia Mother   . Hypertension Mother   . Diabetes Maternal Grandmother   . Breast cancer Other     maternal great aunt  . Hypertension Maternal Grandmother   . Heart disease Maternal Grandmother   . Heart disease Maternal Grandfather     ROS:  Pertinent items are noted in HPI.  Otherwise, a comprehensive ROS was negative.  Exam:   BP 110/66 mmHg  Pulse 62  Resp 16  Ht 5' 3.5" (1.613 m)  Wt 170 lb (77.111 kg)  BMI 29.64 kg/m2  Weight change:  +8#   Height: 5' 3.5" (161.3 cm)  Ht Readings from Last  3 Encounters:  12/12/15 5' 3.5" (1.613 m)  10/08/14 5' 4"  (1.626 m)  05/03/14 5' 4"  (1.626 m)    General appearance: alert, cooperative and appears stated age Head: Normocephalic, without obvious abnormality, atraumatic Neck: no adenopathy, supple, symmetrical, trachea midline and thyroid normal to inspection and palpation Lungs: clear to auscultation bilaterally Breasts: normal appearance, no masses or tenderness Heart: regular rate and rhythm Abdomen: soft, non-tender; bowel sounds normal; no masses,  no organomegaly Extremities: extremities normal, atraumatic, no cyanosis or edema Skin: Skin color, texture, turgor normal. Slightly raised, bumpy rash on right shin Lymph nodes: Cervical, supraclavicular, and axillary nodes normal. No abnormal inguinal nodes palpated Neurologic: Grossly normal   Pelvic: External genitalia:   no lesions              Urethra:  normal appearing urethra with no masses, tenderness or lesions              Bartholins and Skenes: normal                 Vagina: normal appearing vagina with normal color and discharge, no lesions              Cervix: absent              Pap taken: No. Bimanual Exam:  Uterus:  uterus absent              Adnexa: no mass, fullness, tenderness               Rectovaginal: Confirms               Anus:  normal sphincter tone, no lesions  Chaperone was present for exam.  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 D/W pt current guidelines. She is doing ot repeat MMG this year.   CBC, CMP, TSH, Lipids, Vit D Has not seen bariatric surgeon this year in follow-up Kenalog 0.5% cream tid for up to a week.  If not improvement, pt will follow up with dermatologist.  #15gm/1RF. Return annually or prn

## 2015-12-13 LAB — VITAMIN D 25 HYDROXY (VIT D DEFICIENCY, FRACTURES): VIT D 25 HYDROXY: 6 ng/mL — AB (ref 30–100)

## 2015-12-13 MED ORDER — VITAMIN D (ERGOCALCIFEROL) 1.25 MG (50000 UNIT) PO CAPS: 50000.0000 [IU] | ORAL_CAPSULE | ORAL | Status: DC

## 2015-12-13 NOTE — Addendum Note (Signed)
Addended by: Megan Salon on: 12/13/2015 07:37 AM   Modules accepted: Orders, SmartSet

## 2015-12-15 LAB — HEMOGLOBIN, FINGERSTICK: Hemoglobin, fingerstick: 12.1 g/dL (ref 12.0–16.0)

## 2016-04-22 ENCOUNTER — Telehealth: Payer: Self-pay | Admitting: *Deleted

## 2016-04-22 NOTE — Telephone Encounter (Signed)
Patient returned call. Agreeable to schedule 3 month lab recheck of vitamin d. Lab appointment scheduled for Friday 04/30/16 at 1530. Patient agreeable to date and time. Future order present for lab work.   Routing to provider for final review. Patient agreeable to disposition. Will close encounter.

## 2016-04-22 NOTE — Telephone Encounter (Signed)
Attempted to reach patient to schedule 3 month lab visit for vitamin d recheck. Unable to leave message for patient due to busy signal.

## 2016-04-22 NOTE — Telephone Encounter (Signed)
-----   Message from Megan Salon, MD sent at 04/21/2016 11:32 AM EDT ----- Regarding: vit d recheck Pt has not come for her Vit D recheck.  It was very low.  Can you call and see if she will return for this.  Order has been placed.  Thanks.  MSM ----- Message ----- From: SYSTEM Sent: 04/18/2016  12:05 AM To: Megan Salon, MD

## 2016-04-30 ENCOUNTER — Other Ambulatory Visit (INDEPENDENT_AMBULATORY_CARE_PROVIDER_SITE_OTHER): Payer: BLUE CROSS/BLUE SHIELD

## 2016-04-30 DIAGNOSIS — E559 Vitamin D deficiency, unspecified: Secondary | ICD-10-CM

## 2016-05-01 LAB — VITAMIN D 25 HYDROXY (VIT D DEFICIENCY, FRACTURES): VIT D 25 HYDROXY: 19 ng/mL — AB (ref 30–100)

## 2016-05-03 ENCOUNTER — Telehealth: Payer: Self-pay | Admitting: *Deleted

## 2016-05-03 MED ORDER — VITAMIN D (ERGOCALCIFEROL) 1.25 MG (50000 UNIT) PO CAPS: 50000.0000 [IU] | ORAL_CAPSULE | ORAL | 0 refills | Status: DC

## 2016-05-03 NOTE — Telephone Encounter (Signed)
-----   Message from Megan Salon, MD sent at 05/02/2016  6:50 AM EDT ----- Please inform pt her Vit D was 19.  This is improved but not normal yet.  She should continue the Vit D 50,000 IU weekly until her next exam where I will repeat the blood work.  I have not placed orders yet for the Vit D rx to the pharmacy.

## 2016-05-03 NOTE — Telephone Encounter (Signed)
Message left to return call to [facility] at [phone].

## 2016-05-03 NOTE — Telephone Encounter (Signed)
Patient returned call. Notified of results. Patient requests prescription be sent to [facility] on [address]. Prescription sent.   Routing to provider for final review. Patient agreeable to disposition. Will close encounter.

## 2016-10-02 DIAGNOSIS — M1710 Unilateral primary osteoarthritis, unspecified knee: Secondary | ICD-10-CM | POA: Diagnosis not present

## 2016-10-02 DIAGNOSIS — S8982XA Other specified injuries of left lower leg, initial encounter: Secondary | ICD-10-CM | POA: Diagnosis not present

## 2016-10-15 DIAGNOSIS — L4050 Arthropathic psoriasis, unspecified: Secondary | ICD-10-CM | POA: Diagnosis not present

## 2016-10-15 DIAGNOSIS — L409 Psoriasis, unspecified: Secondary | ICD-10-CM | POA: Diagnosis not present

## 2016-10-15 DIAGNOSIS — M2550 Pain in unspecified joint: Secondary | ICD-10-CM | POA: Diagnosis not present

## 2016-10-24 ENCOUNTER — Other Ambulatory Visit: Payer: Self-pay | Admitting: Obstetrics & Gynecology

## 2016-10-25 MED ORDER — VITAMIN D (ERGOCALCIFEROL) 1.25 MG (50000 UNIT) PO CAPS: 50000.0000 [IU] | ORAL_CAPSULE | ORAL | 4 refills | Status: DC

## 2016-11-11 DIAGNOSIS — L409 Psoriasis, unspecified: Secondary | ICD-10-CM | POA: Diagnosis not present

## 2016-11-11 DIAGNOSIS — L4050 Arthropathic psoriasis, unspecified: Secondary | ICD-10-CM | POA: Diagnosis not present

## 2016-11-11 DIAGNOSIS — L732 Hidradenitis suppurativa: Secondary | ICD-10-CM | POA: Diagnosis not present

## 2016-12-08 DIAGNOSIS — L732 Hidradenitis suppurativa: Secondary | ICD-10-CM | POA: Diagnosis not present

## 2016-12-08 DIAGNOSIS — L299 Pruritus, unspecified: Secondary | ICD-10-CM | POA: Diagnosis not present

## 2017-01-27 ENCOUNTER — Ambulatory Visit: Payer: BLUE CROSS/BLUE SHIELD | Admitting: Obstetrics & Gynecology

## 2017-01-31 ENCOUNTER — Encounter (HOSPITAL_COMMUNITY): Payer: Self-pay

## 2017-02-14 DIAGNOSIS — L732 Hidradenitis suppurativa: Secondary | ICD-10-CM | POA: Diagnosis not present

## 2017-02-14 DIAGNOSIS — L4050 Arthropathic psoriasis, unspecified: Secondary | ICD-10-CM | POA: Diagnosis not present

## 2017-02-14 DIAGNOSIS — L409 Psoriasis, unspecified: Secondary | ICD-10-CM | POA: Diagnosis not present

## 2017-02-22 NOTE — Progress Notes (Signed)
41 y.o. G3P2 Married Caucasian F here for annual exam.  Doing well.  Has been to the [employer].    Having some increased issues with psoriatic arthritis.  Did see Dr. Trudie Reed for follow-up.  Hadn't seen her in years.  On Humara right now.  Has been on this for three months.  This has helped her hidradenitis as well.    Denies vaginal bleeding.    No LMP recorded. Patient has had a hysterectomy.          Sexually active: Yes.    The current method of family planning is status post hysterectomy.    Exercising: No.  The patient does not participate in regular exercise at present. Smoker:  no  Health Maintenance: Pap:  04/09/09 normal  History of abnormal Pap:  no MMG:  01/30/14 BIRADS 1 negative  Colonoscopy:  never BMD:   never TDaP:  2014  Pneumonia vaccine(s):  never Zostavax:   never Hep C testing: not indicated  Screening Labs: draw at end, Hb today: same   reports that she has never smoked. She has never used smokeless tobacco. She reports that she does not drink alcohol or use drugs.  Past Medical History:  Diagnosis Date  . Allergy    tx. occ. with OTC meds  . Arthritis    feet, back, hips  . Asthma    has been controlled, not rescue inhaler at present  . Carpal tunnel syndrome   . Complication of anesthesia    coming out of anesthesia with D & C-combative,not breathing, when allowed to come out slower, this was not an issue  . Depression   . Endometriosis of ovary    more resolved since hysterectomy  . GERD (gastroesophageal reflux disease)   . Headache(784.0)    hx. migraines, none in 5 yrs  . Heart murmur   . Hiatal hernia   . Hidradenitis suppurativa    followed by [facility]   . MRSA carrier   . Obesity   . Ovarian cyst    left   . Plantar fasciitis 2009  . PONV (postoperative nausea and vomiting)   . Psoriatic arthritis ([place])    followed by Dr. Berna Bue at [facility]     Past Surgical History:   Procedure Laterality Date  . ABDOMINAL HYSTERECTOMY     laparoscopic(endometriosis)  . CHOLECYSTECTOMY     laparoscopic  . DIAGNOSTIC LAPAROSCOPY  8/96   lysis/endometriosis (LKS)  . DILATION AND CURETTAGE OF UTERUS    . LAPAROSCOPIC GASTRIC SLEEVE RESECTION N/A 07/03/2013   Procedure: LAPAROSCOPIC GASTRIC SLEEVE RESECTION;  Surgeon: Pedro Earls, MD;  Location: [facility];  Service: General;  Laterality: N/A;  . TONSILLECTOMY    . UPPER GI ENDOSCOPY  07/03/2013   Procedure: UPPER GI ENDOSCOPY;  Surgeon: Pedro Earls, MD;  Location: [facility];  Service: General;;    Current Outpatient Prescriptions  Medication Sig Dispense Refill  . cetirizine (ZYRTEC) 10 MG tablet Take 10 mg by mouth daily.    Marland Kitchen HUMIRA PEN 40 MG/0.8ML PNKT     . Pediatric Multivit-Minerals-C (FLINTSTONES COMPLETE PO) Take 1 tablet by mouth at bedtime.    . Vitamin D, Ergocalciferol, (DRISDOL) 50000 units CAPS capsule Take 1 capsule (50,000 Units total) by mouth every 7 (seven) days. 12 capsule 4   No current facility-administered medications for this visit.     Family History  Problem Relation Age of Onset  . Heart disease Mother   .  Hyperlipidemia Mother   . Hypertension Mother   . Diabetes Maternal Grandmother   . Hypertension Maternal Grandmother   . Heart disease Maternal Grandmother   . Breast cancer Other        maternal great aunt  . Heart disease Maternal Grandfather     ROS:  Pertinent items are noted in HPI.  Otherwise, a comprehensive ROS was negative.  Exam:   BP 108/66 (BP Location: Right Arm, Patient Position: Sitting, Cuff Size: Normal)   Pulse 62   Resp 14   Ht 5' 3.5" (1.613 m)   Wt 179 lb 4 oz (81.3 kg)   BMI 31.25 kg/m   Weight change: +9#  Height: 5' 3.5" (161.3 cm)  Ht Readings from Last 3 Encounters:  02/24/17 5' 3.5" (1.613 m)  12/12/15 5' 3.5" (1.613 m)  10/08/14 5' 4"  (1.626 m)    General appearance: alert, cooperative and appears stated age Head: Normocephalic,  without obvious abnormality, atraumatic Neck: no adenopathy, supple, symmetrical, trachea midline and thyroid normal to inspection and palpation Lungs: clear to auscultation bilaterally Breasts: normal appearance, no masses or tenderness Heart: regular rate and rhythm Abdomen: soft, non-tender; bowel sounds normal; no masses,  no organomegaly Extremities: extremities normal, atraumatic, no cyanosis or edema Skin: Skin color, texture, turgor normal. No rashes or lesions Lymph nodes: Cervical, supraclavicular, and axillary nodes normal. No abnormal inguinal nodes palpated Neurologic: Grossly normal   Pelvic: External genitalia:  no lesions              Urethra:  normal appearing urethra with no masses, tenderness or lesions              Bartholins and Skenes: normal                 Vagina: normal appearing vagina with normal color and discharge, no lesions              Cervix: absent              Pap taken: No. Bimanual Exam:  Uterus:  uterus absent              Adnexa: normal adnexa and no mass, fullness, tenderness               Rectovaginal: Confirms               Anus:  normal sphincter tone, no lesions  Chaperone was present for exam.  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

## 2017-02-24 ENCOUNTER — Encounter: Payer: Self-pay | Admitting: Obstetrics & Gynecology

## 2017-02-24 ENCOUNTER — Ambulatory Visit (INDEPENDENT_AMBULATORY_CARE_PROVIDER_SITE_OTHER): Payer: 59 | Admitting: Obstetrics & Gynecology

## 2017-02-24 ENCOUNTER — Other Ambulatory Visit (HOSPITAL_COMMUNITY)
Admission: RE | Admit: 2017-02-24 | Discharge: 2017-02-24 | Disposition: A | Discharge status: Home / Self Care | Payer: 59 | Source: Ambulatory Visit | Attending: Obstetrics & Gynecology | Admitting: Obstetrics & Gynecology

## 2017-02-24 VITALS — BP 108/66 | HR 62 | Resp 14 | Ht 63.5 in | Wt 179.2 lb

## 2017-02-24 DIAGNOSIS — Z01419 Encounter for gynecological examination (general) (routine) without abnormal findings: Secondary | ICD-10-CM | POA: Diagnosis not present

## 2017-02-24 DIAGNOSIS — Z124 Encounter for screening for malignant neoplasm of cervix: Secondary | ICD-10-CM | POA: Diagnosis not present

## 2017-02-24 DIAGNOSIS — L732 Hidradenitis suppurativa: Secondary | ICD-10-CM

## 2017-02-24 DIAGNOSIS — E559 Vitamin D deficiency, unspecified: Secondary | ICD-10-CM | POA: Diagnosis not present

## 2017-02-24 DIAGNOSIS — Z9884 Bariatric surgery status: Secondary | ICD-10-CM | POA: Diagnosis not present

## 2017-02-25 LAB — CYTOLOGY - PAP: Diagnosis: NEGATIVE

## 2017-02-25 LAB — FOLATE: Folate: 7.1 ng/mL (ref >3.0)

## 2017-02-25 LAB — FERRITIN: Ferritin: 92 ng/mL (ref 15–150)

## 2017-02-25 LAB — VITAMIN D 25 HYDROXY (VIT D DEFICIENCY, FRACTURES): Vit D, 25-Hydroxy: 32.8 ng/mL (ref 30.0–100.0)

## 2017-03-25 ENCOUNTER — Ambulatory Visit: Payer: BLUE CROSS/BLUE SHIELD | Admitting: Obstetrics & Gynecology

## 2017-04-21 DIAGNOSIS — S46011A Strain of muscle(s) and tendon(s) of the rotator cuff of right shoulder, initial encounter: Secondary | ICD-10-CM | POA: Diagnosis not present

## 2017-04-21 DIAGNOSIS — M25511 Pain in right shoulder: Secondary | ICD-10-CM | POA: Diagnosis not present

## 2017-06-20 ENCOUNTER — Other Ambulatory Visit: Payer: Self-pay | Admitting: Sports Medicine

## 2017-06-20 DIAGNOSIS — M25511 Pain in right shoulder: Secondary | ICD-10-CM

## 2017-06-26 ENCOUNTER — Ambulatory Visit
Admission: RE | Admit: 2017-06-26 | Discharge: 2017-06-26 | Disposition: A | Discharge status: Home / Self Care | Payer: 59 | Source: Ambulatory Visit | Attending: Sports Medicine | Admitting: Sports Medicine

## 2017-06-26 DIAGNOSIS — M25511 Pain in right shoulder: Secondary | ICD-10-CM | POA: Diagnosis not present

## 2017-07-07 DIAGNOSIS — S46011D Strain of muscle(s) and tendon(s) of the rotator cuff of right shoulder, subsequent encounter: Secondary | ICD-10-CM | POA: Diagnosis not present

## 2017-07-07 DIAGNOSIS — M25511 Pain in right shoulder: Secondary | ICD-10-CM | POA: Diagnosis not present

## 2017-07-07 DIAGNOSIS — M19011 Primary osteoarthritis, right shoulder: Secondary | ICD-10-CM | POA: Diagnosis not present

## 2017-08-05 DIAGNOSIS — S46011D Strain of muscle(s) and tendon(s) of the rotator cuff of right shoulder, subsequent encounter: Secondary | ICD-10-CM | POA: Diagnosis not present

## 2017-08-05 DIAGNOSIS — M19011 Primary osteoarthritis, right shoulder: Secondary | ICD-10-CM | POA: Diagnosis not present

## 2017-08-29 DIAGNOSIS — M25511 Pain in right shoulder: Secondary | ICD-10-CM | POA: Diagnosis not present

## 2017-08-29 DIAGNOSIS — M19011 Primary osteoarthritis, right shoulder: Secondary | ICD-10-CM | POA: Diagnosis not present

## 2017-09-17 DIAGNOSIS — M25531 Pain in right wrist: Secondary | ICD-10-CM | POA: Diagnosis not present

## 2017-09-23 DIAGNOSIS — R5383 Other fatigue: Secondary | ICD-10-CM | POA: Diagnosis not present

## 2017-09-23 DIAGNOSIS — L732 Hidradenitis suppurativa: Secondary | ICD-10-CM | POA: Diagnosis not present

## 2017-09-23 DIAGNOSIS — L409 Psoriasis, unspecified: Secondary | ICD-10-CM | POA: Diagnosis not present

## 2017-09-23 DIAGNOSIS — L4050 Arthropathic psoriasis, unspecified: Secondary | ICD-10-CM | POA: Diagnosis not present

## 2017-09-24 DIAGNOSIS — H6981 Other specified disorders of Eustachian tube, right ear: Secondary | ICD-10-CM | POA: Diagnosis not present

## 2017-10-06 DIAGNOSIS — H9201 Otalgia, right ear: Secondary | ICD-10-CM | POA: Diagnosis not present

## 2017-10-06 DIAGNOSIS — H9041 Sensorineural hearing loss, unilateral, right ear, with unrestricted hearing on the contralateral side: Secondary | ICD-10-CM | POA: Diagnosis not present

## 2017-11-01 DIAGNOSIS — H9041 Sensorineural hearing loss, unilateral, right ear, with unrestricted hearing on the contralateral side: Secondary | ICD-10-CM | POA: Diagnosis not present

## 2017-11-01 DIAGNOSIS — H8101 Meniere's disease, right ear: Secondary | ICD-10-CM | POA: Diagnosis not present

## 2017-11-15 DIAGNOSIS — M25512 Pain in left shoulder: Secondary | ICD-10-CM | POA: Diagnosis not present

## 2017-11-15 DIAGNOSIS — M7502 Adhesive capsulitis of left shoulder: Secondary | ICD-10-CM | POA: Diagnosis not present

## 2017-11-17 ENCOUNTER — Encounter (HOSPITAL_COMMUNITY): Payer: Self-pay | Admitting: Occupational Therapy

## 2017-11-17 ENCOUNTER — Other Ambulatory Visit: Payer: Self-pay

## 2017-11-17 ENCOUNTER — Ambulatory Visit (HOSPITAL_COMMUNITY): Payer: 59 | Attending: Sports Medicine | Admitting: Occupational Therapy

## 2017-11-17 DIAGNOSIS — M25612 Stiffness of left shoulder, not elsewhere classified: Secondary | ICD-10-CM | POA: Diagnosis not present

## 2017-11-17 DIAGNOSIS — M25512 Pain in left shoulder: Secondary | ICD-10-CM | POA: Insufficient documentation

## 2017-11-17 DIAGNOSIS — R29898 Other symptoms and signs involving the musculoskeletal system: Secondary | ICD-10-CM | POA: Diagnosis not present

## 2017-11-17 NOTE — Therapy (Addendum)
[place], [place], [address] Phone: [phone]   Fax:  [phone]  Occupational Therapy Evaluation  Patient Details  Name: Mary Davenport MRN: [id number] Date of Birth: 10-19-1975 Referring Provider: Dr. Vickki Hearing   Encounter Date: 11/17/2017  OT End of Session - 11/17/17 1635    Visit Number  1    Number of Visits  9    Date for OT Re-Evaluation  12/17/17    Authorization Type Authorization Time Period Authorization-Visit Number Authorization-Number of Visits  [employer]; $25 copay  40 Visit limit 1 40   OT Start Time  1515    OT Stop Time  1551    OT Time Calculation (min)  36 min    Activity Tolerance  Patient tolerated treatment well    Behavior During Therapy  [facility] for tasks assessed/performed       Past Medical History:  Diagnosis Date  . Allergy    tx. occ. with OTC meds  . Arthritis    feet, back, hips  . Asthma    has been controlled, not rescue inhaler at present  . Carpal tunnel syndrome   . Complication of anesthesia    coming out of anesthesia with D & C-combative,not breathing, when allowed to come out slower, this was not an issue  . Depression   . Endometriosis of ovary    more resolved since hysterectomy  . GERD (gastroesophageal reflux disease)   . Headache(784.0)    hx. migraines, none in 5 yrs  . Heart murmur   . Hiatal hernia   . Hidradenitis suppurativa    followed by [facility]   . MRSA carrier   . Obesity   . Ovarian cyst    left   . Plantar fasciitis 2009  . PONV (postoperative nausea and vomiting)   . Psoriatic arthritis ([place])    followed by Dr. Berna Bue at [facility]     Past Surgical History:  Procedure Laterality Date  . CHOLECYSTECTOMY     laparoscopic  . DIAGNOSTIC LAPAROSCOPY  8/96   lysis/endometriosis (LKS)  . DILATION AND CURETTAGE OF UTERUS    . LAPAROSCOPIC GASTRIC SLEEVE RESECTION N/A  07/03/2013   Procedure: LAPAROSCOPIC GASTRIC SLEEVE RESECTION;  Surgeon: Pedro Earls, MD;  [facility]: [facility];  Service: General;  Laterality: N/A;  . LAPAROSCOPIC TOTAL HYSTERECTOMY  07/2010   robotic assisted  . TONSILLECTOMY    . UPPER GI ENDOSCOPY  07/03/2013   Procedure: UPPER GI ENDOSCOPY;  Surgeon: Pedro Earls, MD;  [facility]: [facility];  Service: General;;    There were no vitals filed for this visit.  Subjective Assessment - 11/17/17 1632    Subjective   S: Sometimes the pain shoots down my arm.     Pertinent History  Pt is a 42 y/o female presenting with left adhesive capsulitis with gradual onset since min-January. Pt reports she was using her LUE more due to right shoulder issues, which she believes may have been the onset of her left shoulder pain. Pt referred to occupational therapy for evaluation and treatment by Dr. Vickki Hearing.     Special Tests  FOTO Score: 58/100 (42% impairment)    Patient Stated Goals  To have less pain and be able to use my arm more.     Currently in Pain?  Yes    Pain Score  7     Pain Location  Shoulder  Pain Orientation  Left    Pain Descriptors / Indicators  Aching;Sore    Pain Type  Acute pain    Pain Radiating Towards  elbow    Pain Onset  More than a month ago    Pain Frequency  Constant    Aggravating Factors   movement    Pain Relieving Factors  rest, pain medication    Effect of Pain on Daily Activities  mod effect on ADLs    Multiple Pain Sites  No        [facility] OT Assessment - 11/17/17 1510      Assessment   Medical Diagnosis  left adhesive capsulitis    Referring Provider  Dr. Vickki Hearing    Onset Date/Surgical Date  09/06/17    Hand Dominance  Right    Next MD Visit  None yet    Prior Therapy  None      Precautions   Precautions  None      Balance Screen   Has the patient fallen in the past 6 months  No    Has the patient had a decrease in activity level because of a fear of falling?   No    Is the patient  reluctant to leave their home because of a fear of falling?   No      Prior Function   Level of Independence  Independent    Vocation  Full time employment    Engineer, technical sales, typing    Leisure  reading      ADL   ADL comments  Pt is having difficulty with reaching back and up overhead, dressing tasks, lifting overhead      Written Expression   Dominant Hand  Right      Cognition   Overall Cognitive Status  Within Functional Limits for tasks assessed      Observation/Other Assessments   Focus on Therapeutic Outcomes (FOTO)   58/100 (42% impairment)      ROM / Strength   AROM / PROM / Strength  AROM;PROM;Strength      Palpation   Palpation comment  moderate fascial restrictions along left upper arm, trapezius, and scapularis regions      AROM   Overall AROM Comments  Assessed seated, er/IR abducted    AROM Assessment Site  Shoulder    Right/Left Shoulder  Left    Left Shoulder Flexion  101 Degrees    Left Shoulder ABduction  91 Degrees    Left Shoulder Internal Rotation  60 Degrees    Left Shoulder External Rotation  27 Degrees      PROM   Overall PROM Comments  Assessed supine, er/IR adducted    PROM Assessment Site  Shoulder    Right/Left Shoulder  Left    Left Shoulder Flexion  126 Degrees    Left Shoulder ABduction  101 Degrees    Left Shoulder Internal Rotation  90 Degrees    Left Shoulder External Rotation  38 Degrees      Strength   Overall Strength Comments  Assessed seated, er/IR adducted    Strength Assessment Site  Shoulder    Right/Left Shoulder  Left    Left Shoulder Flexion  4/5    Left Shoulder ABduction  4/5    Left Shoulder Internal Rotation  4/5    Left Shoulder External Rotation  4-/5  OT Education - 11/17/17 1551    Education provided  Yes    Education Details  shoulder stretches    Person(s) Educated  Patient    Methods  Explanation;Demonstration;Handout    Comprehension   Verbalized understanding;Returned demonstration       OT Short Term Goals - 11/17/17 1639      OT SHORT TERM GOAL #1   Title  Pt will be provided with and educated on HEP to improve mobility required for functional task completion.     Time  4    Period  Weeks    Status  New    Target Date  12/17/17      OT SHORT TERM GOAL #2   Title  Pt will decrease pain in LUE to 3/10 or less to improve ability to sleep.     Time  4    Period  Weeks    Status  New      OT SHORT TERM GOAL #3   Title  Pt will decrease LUE fascial restrictions to min amounts or less to improve ability to perform overhead reaching tasks.     Time  4    Period  Weeks    Status  New      OT SHORT TERM GOAL #4   Title  Pt will improve LUE A/ROM to WNL to improve ability to donn and doff bra.     Time  4    Period  Weeks    Status  New      OT SHORT TERM GOAL #5   Title  Pt will improve LUE strength to 5/5 to increase ability to lift weighted objects or complete sustained overhead tasks.     Time  4    Period  Weeks    Status  New               Plan - 11/17/17 1636    Clinical Impression Statement  A: Pt is a 42 y/o female presenting with left adhesive capsulitis limiting ability to use LUE for functional tasks due to pain, decreased ROM, and weakness. Pt educated on shoulder stretching HEP and on use of heat for pain. Pt reports MD is working on approval for deep cortisone injection via ultrasound    Occupational Profile and client history currently impacting functional performance  Pt is independent and motivated to return to highest level of functioning.     Occupational performance deficits (Please refer to evaluation for details):  ADL's;IADL's;Rest and Sleep;Work;Leisure    Rehab Potential  Good    OT Frequency  2x / week    OT Duration  4 weeks    OT Treatment/Interventions  Self-care/ADL training;Therapeutic exercise;Ultrasound;Manual Therapy;Therapeutic activities;Cryotherapy;Electrical  Stimulation;Moist Heat;Passive range of motion;Patient/family education    Plan  P: Pt will benefit from skilled OT services to decrease pain and fascial restrictions, improve ROM, strength, and functional use of LUE during daily tasks. Treatment plan: myofascial release, manual therapy, P/ROM, A/ROM, general LUE strengthening, scapular stability and strengthening, modalities prn    Clinical Decision Making  Limited treatment options, no task modification necessary    OT Home Exercise Plan  3/28: shoulder stretches    Consulted and Agree with Plan of Care  Patient       Patient will benefit from skilled therapeutic intervention in order to improve the following deficits and impairments:  Decreased activity tolerance, Decreased strength, Decreased range of motion, Pain, Increased fascial restrictions, Impaired UE functional use  Visit  Diagnosis: Acute pain of left shoulder  Stiffness of left shoulder, not elsewhere classified  Other symptoms and signs involving the musculoskeletal system    Problem List Patient Active Problem List   Diagnosis Date Noted  . Hidradenitis suppurativa 02/24/2017  . 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 Morbid obesity ([place]) 06/29/2013  . GERD (gastroesophageal reflux disease) 04/25/2013   Guadelupe Sabin, OTR/L  [phone] 11/17/2017, 4:42 PM  [place], [place], [address] Phone: [phone]   Fax:  [phone]  Name: EVYNN BOUTELLE MRN: [id number] Date of Birth: [birth year]

## 2017-11-17 NOTE — Addendum Note (Signed)
Addended by: Guadelupe Sabin A on: 11/17/2017 04:43 PM   Modules accepted: Orders

## 2017-11-17 NOTE — Patient Instructions (Signed)

## 2017-11-22 ENCOUNTER — Telehealth (HOSPITAL_COMMUNITY): Payer: Self-pay | Admitting: Family Medicine

## 2017-11-22 ENCOUNTER — Encounter (HOSPITAL_COMMUNITY): Payer: Self-pay | Admitting: Occupational Therapy

## 2017-11-22 ENCOUNTER — Ambulatory Visit (HOSPITAL_COMMUNITY): Payer: 59 | Attending: Sports Medicine | Admitting: Occupational Therapy

## 2017-11-22 DIAGNOSIS — M25612 Stiffness of left shoulder, not elsewhere classified: Secondary | ICD-10-CM | POA: Insufficient documentation

## 2017-11-22 DIAGNOSIS — M25512 Pain in left shoulder: Secondary | ICD-10-CM | POA: Insufficient documentation

## 2017-11-22 DIAGNOSIS — R29898 Other symptoms and signs involving the musculoskeletal system: Secondary | ICD-10-CM | POA: Diagnosis not present

## 2017-11-22 NOTE — Telephone Encounter (Signed)
11/22/17  cx patient realized it was Good Friday and she will be out of town

## 2017-11-22 NOTE — Therapy (Addendum)
[place], [place], [address] Phone: [phone]   Fax:  [phone]  Occupational Therapy Treatment  Patient Details  Name: Mary Davenport MRN: [id number] Date of Birth: Jul 04, 1976 Referring Provider: Dr. Vickki Hearing   Encounter Date: 11/22/2017  OT End of Session - 11/22/17 1523    Visit Number  2    Number of Visits  9    Date for OT Re-Evaluation  12/17/17    Authorization Type  [employer]; $25 copay    Authorization Time Period  40 visit limit    Authorization - Visit Number  2    Authorization - Number of Visits  40    OT Start Time  7793    OT Stop Time  1432    OT Time Calculation (min)  40 min    Activity Tolerance  Patient tolerated treatment well    Behavior During Therapy  [facility] for tasks assessed/performed       Past Medical History:  Diagnosis Date  . Allergy    tx. occ. with OTC meds  . Arthritis    feet, back, hips  . Asthma    has been controlled, not rescue inhaler at present  . Carpal tunnel syndrome   . Complication of anesthesia    coming out of anesthesia with D & C-combative,not breathing, when allowed to come out slower, this was not an issue  . Depression   . Endometriosis of ovary    more resolved since hysterectomy  . GERD (gastroesophageal reflux disease)   . Headache(784.0)    hx. migraines, none in 5 yrs  . Heart murmur   . Hiatal hernia   . Hidradenitis suppurativa    followed by [facility]   . MRSA carrier   . Obesity   . Ovarian cyst    left   . Plantar fasciitis 2009  . PONV (postoperative nausea and vomiting)   . Psoriatic arthritis ([place])    followed by Dr. Berna Bue at [facility]     Past Surgical History:  Procedure Laterality Date  . CHOLECYSTECTOMY     laparoscopic  . DIAGNOSTIC LAPAROSCOPY  8/96   lysis/endometriosis (LKS)  . DILATION AND CURETTAGE OF UTERUS    . LAPAROSCOPIC GASTRIC SLEEVE RESECTION  N/A 07/03/2013   Procedure: LAPAROSCOPIC GASTRIC SLEEVE RESECTION;  Surgeon: Pedro Earls, MD;  [facility];  Service: General;  Laterality: N/A;  . LAPAROSCOPIC TOTAL HYSTERECTOMY  07/2010   robotic assisted  . TONSILLECTOMY    . UPPER GI ENDOSCOPY  07/03/2013   Procedure: UPPER GI ENDOSCOPY;  Surgeon: Pedro Earls, MD;  [facility];  Service: General;;    There were no vitals filed for this visit.  Subjective Assessment - 11/22/17 1353    Subjective   S: It's been very very tight.     Currently in Pain?  No/denies         [facility] OT Assessment - 11/22/17 1353      Assessment   Medical Diagnosis  left adhesive capsulitis      Precautions   Precautions  None               OT Treatments/Exercises (OP) - 11/22/17 1353      Exercises   Exercises  Shoulder      Shoulder Exercises: Supine   Protraction  PROM;5 reps;AAROM;12 reps    Horizontal ABduction  PROM;5 reps;AROM;12 reps  External Rotation  PROM;5 reps;AAROM;12 reps    Internal Rotation  PROM;5 reps;AAROM;12 reps    Flexion  PROM;5 reps;AAROM;12 reps    ABduction  PROM;5 reps;AAROM;12 reps      Shoulder Exercises: Seated   Extension  AROM;10 reps    Row  AROM;10 reps    Other Seated Exercises  depression, 10X A/ROM      Shoulder Exercises: Pulleys   Flexion  1 minute    ABduction  1 minute      Shoulder Exercises: ROM/Strengthening   Prot/Ret//Elev/Dep  1'      Manual Therapy   Manual Therapy  Myofascial release;Muscle Energy Technique    Manual therapy comments  Completed separately from therapeutic exercise    Myofascial Release  myofascial release to left upper arm, trapezius, and scapularis regions to decrease pain and fascial restrictions and increase joint range of motion    Muscle Energy Technique  muscle energy technique to left shoulder region for flexion and er to decrease muscle spasms and improve joint ROM during passive stretching               OT Short Term  Goals - 11/22/17 1419      OT SHORT TERM GOAL #1   Title  Pt will be provided with and educated on HEP to improve mobility required for functional task completion.     Time  4    Period  Weeks    Status  On-going      OT SHORT TERM GOAL #2   Title  Pt will decrease pain in LUE to 3/10 or less to improve ability to sleep.     Time  4    Period  Weeks    Status  On-going      OT SHORT TERM GOAL #3   Title  Pt will decrease LUE fascial restrictions to min amounts or less to improve ability to perform overhead reaching tasks.     Time  4    Period  Weeks    Status  On-going      OT SHORT TERM GOAL #4   Title  Pt will improve LUE A/ROM to WNL to improve ability to donn and doff bra.     Time  4    Period  Weeks    Status  On-going      OT SHORT TERM GOAL #5   Title  Pt will improve LUE strength to 5/5 to increase ability to lift weighted objects or complete sustained overhead tasks.     Time  4    Period  Weeks    Status  On-going               Plan - 11/22/17 1524    Clinical Impression Statement  A: Pt reports tightness more than pain today, has been completing shoulder stretches daily. Pt requiring cuing to relax arm during P/ROM to allow for greater stretch, is able to reach [facility] during AA/ROM and pulley exercises. Verbal cuing throughout session for form and technique.     Plan  P: Update HEP for AA/ROM or A/ROM dependent upon what pt able to tolerate during session. Add standing AA/ROM and/or A/ROM working on improving ROM required for ADL completion.     Consulted and Agree with Plan of Care  Patient       Patient will benefit from skilled therapeutic intervention in order to improve the following deficits and impairments:  Decreased activity tolerance, Decreased strength, Decreased  range of motion, Pain, Increased fascial restrictions, Impaired UE functional use  Visit Diagnosis: Acute pain of left shoulder  Stiffness of left shoulder, not elsewhere  classified  Other symptoms and signs involving the musculoskeletal system    Problem List Patient Active Problem List   Diagnosis Date Noted  . Hidradenitis suppurativa 02/24/2017  . 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 Morbid obesity ([place]) 06/29/2013  . GERD (gastroesophageal reflux disease) 04/25/2013   Guadelupe Sabin, OTR/L  [phone] 11/22/2017, 3:41 PM  [place], [place], [address] Phone: [phone]   Fax:  [phone]  Name: ARIABELLA BRIEN MRN: [id number] Date of Birth: 07-10-1976

## 2017-12-01 ENCOUNTER — Ambulatory Visit (HOSPITAL_COMMUNITY): Payer: 59

## 2017-12-01 ENCOUNTER — Other Ambulatory Visit: Payer: Self-pay

## 2017-12-01 ENCOUNTER — Encounter (HOSPITAL_COMMUNITY): Payer: Self-pay

## 2017-12-01 DIAGNOSIS — R29898 Other symptoms and signs involving the musculoskeletal system: Secondary | ICD-10-CM

## 2017-12-01 DIAGNOSIS — M25512 Pain in left shoulder: Secondary | ICD-10-CM | POA: Diagnosis not present

## 2017-12-01 DIAGNOSIS — M25612 Stiffness of left shoulder, not elsewhere classified: Secondary | ICD-10-CM

## 2017-12-02 NOTE — Therapy (Signed)
[place], [place], [address] Phone: [phone]   Fax:  [phone]  Occupational Therapy Treatment  Patient Details  Name: Mary Davenport MRN: [id number] Date of Birth: July 29, 1976 Referring Provider: Dr. Vickki Hearing   Encounter Date: 12/01/2017  OT End of Session - 12/01/17 1722    Visit Number  3    Number of Visits  9    Date for OT Re-Evaluation  12/17/17    Authorization Type  [employer]; $25 copay    Authorization Time Period  40 visit limit    Authorization - Visit Number  3    Authorization - Number of Visits  40    OT Start Time  6144    OT Stop Time  1730    OT Time Calculation (min)  40 min    Activity Tolerance  Patient tolerated treatment well    Behavior During Therapy  [facility] for tasks assessed/performed       Past Medical History:  Diagnosis Date  . Allergy    tx. occ. with OTC meds  . Arthritis    feet, back, hips  . Asthma    has been controlled, not rescue inhaler at present  . Carpal tunnel syndrome   . Complication of anesthesia    coming out of anesthesia with D & C-combative,not breathing, when allowed to come out slower, this was not an issue  . Depression   . Endometriosis of ovary    more resolved since hysterectomy  . GERD (gastroesophageal reflux disease)   . Headache(784.0)    hx. migraines, none in 5 yrs  . Heart murmur   . Hiatal hernia   . Hidradenitis suppurativa    followed by [facility]   . MRSA carrier   . Obesity   . Ovarian cyst    left   . Plantar fasciitis 2009  . PONV (postoperative nausea and vomiting)   . Psoriatic arthritis ([place])    followed by Dr. Berna Bue at [facility]     Past Surgical History:  Procedure Laterality Date  . CHOLECYSTECTOMY     laparoscopic  . DIAGNOSTIC LAPAROSCOPY  8/96   lysis/endometriosis (LKS)  . DILATION AND CURETTAGE OF UTERUS    . LAPAROSCOPIC GASTRIC SLEEVE RESECTION  N/A 07/03/2013   Procedure: LAPAROSCOPIC GASTRIC SLEEVE RESECTION;  Surgeon: Pedro Earls, MD;  Location: [facility];  Service: General;  Laterality: N/A;  . LAPAROSCOPIC TOTAL HYSTERECTOMY  07/2010   robotic assisted  . TONSILLECTOMY    . UPPER GI ENDOSCOPY  07/03/2013   Procedure: UPPER GI ENDOSCOPY;  Surgeon: Pedro Earls, MD;  Location: [facility];  Service: General;;    There were no vitals filed for this visit.  Subjective Assessment - 12/01/17 1719    Subjective   S: When I used the hot tub the other night it was the most loose it has been.     Currently in Pain?  No/denies                   OT Treatments/Exercises (OP) - 12/01/17 1719      Exercises   Exercises  Shoulder      Shoulder Exercises: Supine   Protraction  PROM;5 reps    Horizontal ABduction  PROM;5 reps;AAROM 2# dowel 12X    External Rotation  PROM;5 reps    Internal Rotation  PROM;5 reps    Flexion  PROM;5 reps;AAROM 2# dowel,  12X    ABduction  PROM;5 reps      Shoulder Exercises: Standing   Flexion  AAROM;12 reps 2# dowel rod    ABduction  AAROM;12 reps 2# dowel rod      Shoulder Exercises: Stretch   Other Shoulder Stretches  Sleeper stretch against wall; 2x15"      Manual Therapy   Manual Therapy  Joint mobilization;Myofascial release;Muscle Energy Technique    Manual therapy comments  Completed separately from therapeutic exercise    Joint Mobilization  Pectoralis minor release completed to left UE to decrease trigger point and muscle tighness and increase joint mobiilty needed for functional tasks.     Myofascial Release  myofascial release to left upper arm, trapezius, and scapularis regions to decrease pain and fascial restrictions and increase joint range of motion    Muscle Energy Technique  muscle energy technique to left shoulder region for flexion and er to decrease muscle spasms and improve joint ROM during passive stretching             OT Education - 12/02/17 0850     Education provided  Yes    Education Details  Standing sleeper stretch    Person(s) Educated  Patient    Methods  Explanation;Demonstration    Comprehension  Verbalized understanding;Returned demonstration       OT Short Term Goals - 11/22/17 1419      OT SHORT TERM GOAL #1   Title  Pt will be provided with and educated on HEP to improve mobility required for functional task completion.     Time  4    Period  Weeks    Status  On-going      OT SHORT TERM GOAL #2   Title  Pt will decrease pain in LUE to 3/10 or less to improve ability to sleep.     Time  4    Period  Weeks    Status  On-going      OT SHORT TERM GOAL #3   Title  Pt will decrease LUE fascial restrictions to min amounts or less to improve ability to perform overhead reaching tasks.     Time  4    Period  Weeks    Status  On-going      OT SHORT TERM GOAL #4   Title  Pt will improve LUE A/ROM to WNL to improve ability to donn and doff bra.     Time  4    Period  Weeks    Status  On-going      OT SHORT TERM GOAL #5   Title  Pt will improve LUE strength to 5/5 to increase ability to lift weighted objects or complete sustained overhead tasks.     Time  4    Period  Weeks    Status  On-going               Plan - 12/02/17 0846    Clinical Impression Statement  A: Pt with fascial restrictions in the right anterior shoulder region and upper trapezius. manual therapy techniques completed to decrease fascial restrictions and increase joint mobility including muscle energy technique, myofascial release, joint mobilization techniques. patient continues to have limitations with fascial restrictions. 2# dowel rod use during stretches to increase stretch needed to complete ROM. VC for form and technique.    Plan  P: Update HEP as needed. HEP was not updated last session due time constraint. Continue to complete manual therapy techniques and passive  stretching within patient's pain tolerance to increase functional ROM  of LUE. Add ball on the wall.     Consulted and Agree with Plan of Care  Patient       Patient will benefit from skilled therapeutic intervention in order to improve the following deficits and impairments:  Decreased activity tolerance, Decreased strength, Decreased range of motion, Pain, Increased fascial restrictions, Impaired UE functional use  Visit Diagnosis: Acute pain of left shoulder  Stiffness of left shoulder, not elsewhere classified  Other symptoms and signs involving the musculoskeletal system    Problem List Patient Active Problem List   Diagnosis Date Noted  . Hidradenitis suppurativa 02/24/2017  . 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 Morbid obesity ([place]) 06/29/2013  . GERD (gastroesophageal reflux disease) 04/25/2013   Ailene Ravel, OTR/L,CBIS  [phone]  12/02/2017, 9:00 AM  [place], [place], [address] Phone: [phone]   Fax:  [phone]  Name: Mary Davenport MRN: [id number] Date of Birth: [birth year]

## 2017-12-07 ENCOUNTER — Ambulatory Visit (HOSPITAL_COMMUNITY): Payer: 59 | Admitting: Occupational Therapy

## 2017-12-07 ENCOUNTER — Encounter (HOSPITAL_COMMUNITY): Payer: Self-pay | Admitting: Occupational Therapy

## 2017-12-07 DIAGNOSIS — M25612 Stiffness of left shoulder, not elsewhere classified: Secondary | ICD-10-CM

## 2017-12-07 DIAGNOSIS — R29898 Other symptoms and signs involving the musculoskeletal system: Secondary | ICD-10-CM

## 2017-12-07 DIAGNOSIS — M25512 Pain in left shoulder: Secondary | ICD-10-CM

## 2017-12-07 NOTE — Therapy (Signed)
[place], [place], [address] Phone: [phone]   Fax:  [phone]  Occupational Therapy Treatment  Patient Details  Name: Mary Davenport MRN: [id number] Date of Birth: 06/28/1976 Referring Provider: Dr. Vickki Hearing   Encounter Date: 12/07/2017  OT End of Session - 12/07/17 1543    Visit Number  4    Number of Visits  9    Date for OT Re-Evaluation  12/17/17    Authorization Type  [employer]; $25 copay    Authorization Time Period  40 visit limit    Authorization - Visit Number  4    Authorization - Number of Visits  40    OT Start Time  1017    OT Stop Time  1516    OT Time Calculation (min)  43 min    Activity Tolerance  Patient tolerated treatment well    Behavior During Therapy  [facility] for tasks assessed/performed       Past Medical History:  Diagnosis Date  . Allergy    tx. occ. with OTC meds  . Arthritis    feet, back, hips  . Asthma    has been controlled, not rescue inhaler at present  . Carpal tunnel syndrome   . Complication of anesthesia    coming out of anesthesia with D & C-combative,not breathing, when allowed to come out slower, this was not an issue  . Depression   . Endometriosis of ovary    more resolved since hysterectomy  . GERD (gastroesophageal reflux disease)   . Headache(784.0)    hx. migraines, none in 5 yrs  . Heart murmur   . Hiatal hernia   . Hidradenitis suppurativa    followed by [facility]   . MRSA carrier   . Obesity   . Ovarian cyst    left   . Plantar fasciitis 2009  . PONV (postoperative nausea and vomiting)   . Psoriatic arthritis ([place])    followed by Dr. Berna Bue at [facility]     Past Surgical History:  Procedure Laterality Date  . CHOLECYSTECTOMY     laparoscopic  . DIAGNOSTIC LAPAROSCOPY  8/96   lysis/endometriosis (LKS)  . DILATION AND CURETTAGE OF UTERUS    . LAPAROSCOPIC GASTRIC SLEEVE RESECTION  N/A 07/03/2013   Procedure: LAPAROSCOPIC GASTRIC SLEEVE RESECTION;  Surgeon: Pedro Earls, MD;  [facility]: [facility];  Service: General;  Laterality: N/A;  . LAPAROSCOPIC TOTAL HYSTERECTOMY  07/2010   robotic assisted  . TONSILLECTOMY    . UPPER GI ENDOSCOPY  07/03/2013   Procedure: UPPER GI ENDOSCOPY;  Surgeon: Pedro Earls, MD;  [facility];  Service: General;;    There were no vitals filed for this visit.  Subjective Assessment - 12/07/17 1434    Subjective   S: Last session was brutal.     Currently in Pain?  Yes    Pain Score  5     Pain Location  Shoulder    Pain Orientation  Left    Pain Descriptors / Indicators  Aching;Sore    Pain Type  Acute pain    Pain Radiating Towards  elbow    Pain Onset  More than a month ago    Pain Frequency  Constant    Aggravating Factors   movement    Pain Relieving Factors  rest, pain medication    Effect of Pain on Daily Activities  mod effect on ADLs  Multiple Pain Sites  No         [facility] OT Assessment - 12/07/17 1436      Assessment   Medical Diagnosis  left adhesive capsulitis      Precautions   Precautions  None               OT Treatments/Exercises (OP) - 12/07/17 1436      Exercises   Exercises  Shoulder      Shoulder Exercises: Supine   Protraction  PROM;5 reps    Horizontal ABduction  PROM;5 reps;AAROM;10 reps 2# dowel rod    External Rotation  PROM;5 reps    Internal Rotation  PROM;5 reps    Flexion  PROM;5 reps;AAROM;10 reps 2# dowel rod    ABduction  PROM;5 reps      Shoulder Exercises: Standing   Protraction  AROM;10 reps    Flexion  AROM;10 reps    ABduction  AAROM;12 reps 2# dowel rod    Extension  Theraband;10 reps    Theraband Level (Shoulder Extension)  Level 2 (Red)    Row  Theraband;10 reps    Theraband Level (Shoulder Row)  Level 2 (Red)    Retraction  Theraband;10 reps    Theraband Level (Shoulder Retraction)  Level 2 (Red)    Other Standing Exercises  depression, A/ROM, 10X       Shoulder Exercises: ROM/Strengthening   Prot/Ret//Elev/Dep  1'      Shoulder Exercises: Stretch   Other Shoulder Stretches  Sleeper stretch against wall; 2x15"      Manual Therapy   Manual Therapy  Myofascial release    Manual therapy comments  Completed separately from therapeutic exercise    Myofascial Release  myofascial release to left upper arm, trapezius, and scapularis regions to decrease pain and fascial restrictions and increase joint range of motion    Muscle Energy Technique  muscle energy technique to left shoulder region for flexion and er to decrease muscle spasms and improve joint ROM during passive stretching               OT Short Term Goals - 11/22/17 1419      OT SHORT TERM GOAL #1   Title  Pt will be provided with and educated on HEP to improve mobility required for functional task completion.     Time  4    Period  Weeks    Status  On-going      OT SHORT TERM GOAL #2   Title  Pt will decrease pain in LUE to 3/10 or less to improve ability to sleep.     Time  4    Period  Weeks    Status  On-going      OT SHORT TERM GOAL #3   Title  Pt will decrease LUE fascial restrictions to min amounts or less to improve ability to perform overhead reaching tasks.     Time  4    Period  Weeks    Status  On-going      OT SHORT TERM GOAL #4   Title  Pt will improve LUE A/ROM to WNL to improve ability to donn and doff bra.     Time  4    Period  Weeks    Status  On-going      OT SHORT TERM GOAL #5   Title  Pt will improve LUE strength to 5/5 to increase ability to lift weighted objects or complete sustained overhead tasks.  Time  4    Period  Weeks    Status  On-going               Plan - 12/07/17 1543    Clinical Impression Statement  A: Pt reporting soreness after previous session, does report slight improvement in flexion after manual therapy from previous session. Continued with manual therapy and muscle energy techniques working on  improving joint mobility. Pt able to tolerate passive stretching to WNL for abduction and WFL with flexion. Added scapular theraband this session, also added A/ROM in standing. Verbal cuing for form and technique during exercises. Did not complete ball on wall due to time constraints.     Plan  P: continue with pectoralis minor release if pt able to tolerate and is appropriate for improved range. Add ball on wall and update HEP for scapular theraband       Patient will benefit from skilled therapeutic intervention in order to improve the following deficits and impairments:  Decreased activity tolerance, Decreased strength, Decreased range of motion, Pain, Increased fascial restrictions, Impaired UE functional use  Visit Diagnosis: Acute pain of left shoulder  Stiffness of left shoulder, not elsewhere classified  Other symptoms and signs involving the musculoskeletal system    Problem List Patient Active Problem List   Diagnosis Date Noted  . Hidradenitis suppurativa 02/24/2017  . 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 Morbid obesity ([place]) 06/29/2013  . GERD (gastroesophageal reflux disease) 04/25/2013   Mary Davenport, OTR/L  [phone] 12/07/2017, 3:47 PM  [place], [place], [address] Phone: [phone]   Fax:  [phone]  Name: Mary Davenport MRN: [id number] Date of Birth: Nov 14, 1975

## 2017-12-09 ENCOUNTER — Encounter (HOSPITAL_COMMUNITY): Payer: 59

## 2017-12-12 ENCOUNTER — Other Ambulatory Visit: Payer: Self-pay

## 2017-12-12 ENCOUNTER — Encounter (HOSPITAL_COMMUNITY): Payer: Self-pay

## 2017-12-12 ENCOUNTER — Ambulatory Visit (HOSPITAL_COMMUNITY): Payer: 59

## 2017-12-12 DIAGNOSIS — M25512 Pain in left shoulder: Secondary | ICD-10-CM

## 2017-12-12 DIAGNOSIS — M25612 Stiffness of left shoulder, not elsewhere classified: Secondary | ICD-10-CM

## 2017-12-12 DIAGNOSIS — R29898 Other symptoms and signs involving the musculoskeletal system: Secondary | ICD-10-CM

## 2017-12-12 NOTE — Therapy (Signed)
[place], [place], [address] Phone: [phone]   Fax:  [phone]  Occupational Therapy Treatment  Patient Details  Name: Mary Davenport MRN: [id number] Date of Birth: [birth year] Referring Provider: Dr. Vickki Hearing   Encounter Date: 12/12/2017  OT End of Session - 12/12/17 1640    Visit Number  5    Number of Visits  9    Date for OT Re-Evaluation  12/17/17    Authorization Type  [employer]; $25 copay    Authorization Time Period  40 visit limit    Authorization - Visit Number  5    Authorization - Number of Visits  40    OT Start Time  2025    OT Stop Time  1645    OT Time Calculation (min)  40 min    Activity Tolerance  Patient tolerated treatment well    Behavior During Therapy  [facility] for tasks assessed/performed       Past Medical History:  Diagnosis Date  . Allergy    tx. occ. with OTC meds  . Arthritis    feet, back, hips  . Asthma    has been controlled, not rescue inhaler at present  . Carpal tunnel syndrome   . Complication of anesthesia    coming out of anesthesia with D & C-combative,not breathing, when allowed to come out slower, this was not an issue  . Depression   . Endometriosis of ovary    more resolved since hysterectomy  . GERD (gastroesophageal reflux disease)   . Headache(784.0)    hx. migraines, none in 5 yrs  . Heart murmur   . Hiatal hernia   . Hidradenitis suppurativa    followed by [facility]   . MRSA carrier   . Obesity   . Ovarian cyst    left   . Plantar fasciitis 2009  . PONV (postoperative nausea and vomiting)   . Psoriatic arthritis ([place])    followed by Dr. Berna Bue at [facility]     Past Surgical History:  Procedure Laterality Date  . CHOLECYSTECTOMY     laparoscopic  . DIAGNOSTIC LAPAROSCOPY  8/96   lysis/endometriosis (LKS)  . DILATION AND CURETTAGE OF UTERUS    . LAPAROSCOPIC GASTRIC SLEEVE RESECTION  N/A 07/03/2013   Procedure: LAPAROSCOPIC GASTRIC SLEEVE RESECTION;  Surgeon: Pedro Earls, MD;  Location: [facility];  Service: General;  Laterality: N/A;  . LAPAROSCOPIC TOTAL HYSTERECTOMY  07/2010   robotic assisted  . TONSILLECTOMY    . UPPER GI ENDOSCOPY  07/03/2013   Procedure: UPPER GI ENDOSCOPY;  Surgeon: Pedro Earls, MD;  [facility]: [facility];  Service: General;;    There were no vitals filed for this visit.  Subjective Assessment - 12/12/17 1623    Subjective   S: It was hurting a few minutes ago.     Currently in Pain?  No/denies         [facility] OT Assessment - 12/12/17 1624      Assessment   Medical Diagnosis  left adhesive capsulitis      Precautions   Precautions  None               OT Treatments/Exercises (OP) - 12/12/17 1624      Exercises   Exercises  Shoulder      Shoulder Exercises: Supine   Protraction  PROM;5 reps    Horizontal ABduction  PROM;5 reps;AAROM;12 reps 2# dowel  External Rotation  PROM;5 reps    Internal Rotation  PROM;5 reps    Flexion  PROM;5 reps;AAROM;12 reps 2# dowel    ABduction  PROM;5 reps      Shoulder Exercises: Standing   Horizontal ABduction  AAROM;12 reps 23 dowel    Flexion  AAROM;12 reps 23 dowel    ABduction  AAROM;12 reps 2# dowel    Extension  Theraband;10 reps    Theraband Level (Shoulder Extension)  Level 2 (Red)    Row  Theraband;10 reps    Theraband Level (Shoulder Row)  Level 2 (Red)    Retraction  Theraband;10 reps    Theraband Level (Shoulder Retraction)  Level 2 (Red)      Shoulder Exercises: ROM/Strengthening   UBE (Upper Arm Bike)  level 2 2' forward 2' reverse pace: 3.5-4.0    Ball on Wall  1' flexion 1' abduction green ball      Shoulder Exercises: Stretch   Star Gazer Stretch  1 rep;30 seconds      Manual Therapy   Manual Therapy  Myofascial release    Manual therapy comments  Completed separately from therapeutic exercise    Myofascial Release  myofascial release to left upper arm,  trapezius, and scapularis regions to decrease pain and fascial restrictions and increase joint range of motion    Muscle Energy Technique  muscle energy technique to left shoulder region for flexion and er to decrease muscle spasms and improve joint ROM during passive stretching             OT Education - 12/12/17 1635    Education provided  Yes    Education Details  red theraband scapular strengthening.     Person(s) Educated  Patient    Methods  Explanation;Demonstration;Verbal cues;Handout    Comprehension  Returned demonstration;Verbalized understanding       OT Short Term Goals - 11/22/17 1419      OT SHORT TERM GOAL #1   Title  Pt will be provided with and educated on HEP to improve mobility required for functional task completion.     Time  4    Period  Weeks    Status  On-going      OT SHORT TERM GOAL #2   Title  Pt will decrease pain in LUE to 3/10 or less to improve ability to sleep.     Time  4    Period  Weeks    Status  On-going      OT SHORT TERM GOAL #3   Title  Pt will decrease LUE fascial restrictions to min amounts or less to improve ability to perform overhead reaching tasks.     Time  4    Period  Weeks    Status  On-going      OT SHORT TERM GOAL #4   Title  Pt will improve LUE A/ROM to WNL to improve ability to donn and doff bra.     Time  4    Period  Weeks    Status  On-going      OT SHORT TERM GOAL #5   Title  Pt will improve LUE strength to 5/5 to increase ability to lift weighted objects or complete sustained overhead tasks.     Time  4    Period  Weeks    Status  On-going               Plan - 12/12/17 1641    Clinical Impression Statement  A: Patient's fascial restrictions have decreased since previous session although are still prominant. Manual techniques completed to address. Added UBE bike and ball on the wall for scapular stability and strength. VC for form and technique.    Plan  P: Continue to increase ROM as patient is  able to tolerate. Follow up on HEP.     Consulted and Agree with Plan of Care  Patient       Patient will benefit from skilled therapeutic intervention in order to improve the following deficits and impairments:  Decreased activity tolerance, Decreased strength, Decreased range of motion, Pain, Increased fascial restrictions, Impaired UE functional use  Visit Diagnosis: Acute pain of left shoulder  Stiffness of left shoulder, not elsewhere classified  Other symptoms and signs involving the musculoskeletal system    Problem List Patient Active Problem List   Diagnosis Date Noted  . Hidradenitis suppurativa 02/24/2017  . 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 Morbid obesity ([place]) 06/29/2013  . GERD (gastroesophageal reflux disease) 04/25/2013   Ailene Ravel, OTR/L,CBIS  [phone]  12/12/2017, 4:47 PM  [place] [address] [place], [place], [address] Phone: [phone]   Fax:  [phone]  Name: Mary Davenport MRN: [id number] Date of Birth: [birth year]

## 2017-12-12 NOTE — Patient Instructions (Signed)

## 2017-12-14 ENCOUNTER — Encounter (HOSPITAL_COMMUNITY): Payer: Self-pay | Admitting: Occupational Therapy

## 2017-12-14 ENCOUNTER — Ambulatory Visit (HOSPITAL_COMMUNITY): Payer: 59 | Admitting: Occupational Therapy

## 2017-12-14 DIAGNOSIS — M25512 Pain in left shoulder: Secondary | ICD-10-CM

## 2017-12-14 DIAGNOSIS — R29898 Other symptoms and signs involving the musculoskeletal system: Secondary | ICD-10-CM

## 2017-12-14 DIAGNOSIS — M25612 Stiffness of left shoulder, not elsewhere classified: Secondary | ICD-10-CM

## 2017-12-14 NOTE — Therapy (Addendum)
[place], [place], [address] Phone: [phone]   Fax:  [phone]  Occupational Therapy Treatment  Patient Details  Name: Mary Davenport MRN: [id number] Date of Birth: March 22, 1976 Referring Provider: Dr. Vickki Hearing   Encounter Date: 12/14/2017  OT End of Session - 12/14/17 1444    Visit Number  6    Number of Visits  9    Date for OT Re-Evaluation  12/17/17    Authorization Type  [employer]; $25 copay    Authorization Time Period  40 visit limit    Authorization - Visit Number  6    Authorization - Number of Visits  40    OT Start Time  1694    OT Stop Time  1428    OT Time Calculation (min)  40 min    Activity Tolerance  Patient tolerated treatment well    Behavior During Therapy  [facility] for tasks assessed/performed       Past Medical History:  Diagnosis Date  . Allergy    tx. occ. with OTC meds  . Arthritis    feet, back, hips  . Asthma    has been controlled, not rescue inhaler at present  . Carpal tunnel syndrome   . Complication of anesthesia    coming out of anesthesia with D & C-combative,not breathing, when allowed to come out slower, this was not an issue  . Depression   . Endometriosis of ovary    more resolved since hysterectomy  . GERD (gastroesophageal reflux disease)   . Headache(784.0)    hx. migraines, none in 5 yrs  . Heart murmur   . Hiatal hernia   . Hidradenitis suppurativa    followed by [facility]   . MRSA carrier   . Obesity   . Ovarian cyst    left   . Plantar fasciitis 2009  . PONV (postoperative nausea and vomiting)   . Psoriatic arthritis ([place])    followed by Dr. Berna Bue at [facility]     Past Surgical History:  Procedure Laterality Date  . CHOLECYSTECTOMY     laparoscopic  . DIAGNOSTIC LAPAROSCOPY  8/96   lysis/endometriosis (LKS)  . DILATION AND CURETTAGE OF UTERUS    . LAPAROSCOPIC GASTRIC SLEEVE RESECTION  N/A 07/03/2013   Procedure: LAPAROSCOPIC GASTRIC SLEEVE RESECTION;  Surgeon: Pedro Earls, MD;  Location: [facility];  Service: General;  Laterality: N/A;  . LAPAROSCOPIC TOTAL HYSTERECTOMY  07/2010   robotic assisted  . TONSILLECTOMY    . UPPER GI ENDOSCOPY  07/03/2013   Procedure: UPPER GI ENDOSCOPY;  Surgeon: Pedro Earls, MD;  Location: [facility];  Service: General;;    There were no vitals filed for this visit.  Subjective Assessment - 12/14/17 1348    Subjective   S: I used the bands this morning.     Currently in Pain?  Yes    Pain Score  5     Pain Location  Shoulder    Pain Orientation  Left    Pain Descriptors / Indicators  Aching;Sore    Pain Type  Acute pain    Pain Radiating Towards  elbow    Pain Onset  More than a month ago    Pain Frequency  Constant    Aggravating Factors   movement    Pain Relieving Factors  rest, pain medication    Effect of Pain on Daily Activities  mod effect on  ADLs    Multiple Pain Sites  No         [facility] OT Assessment - 12/14/17 1346      Assessment   Medical Diagnosis  left adhesive capsulitis      Precautions   Precautions  None               OT Treatments/Exercises (OP) - 12/14/17 1348      Exercises   Exercises  Shoulder      Shoulder Exercises: Supine   Protraction  PROM;5 reps    Horizontal ABduction  PROM;5 reps;AAROM;12 reps 2# dowel rod    External Rotation  PROM;5 reps    Internal Rotation  PROM;5 reps    Flexion  PROM;5 reps;AAROM;12 reps 2# dowel rod    ABduction  PROM;5 reps      Shoulder Exercises: Sidelying   External Rotation  AROM;10 reps    Internal Rotation  AROM;10 reps    Flexion  AROM;10 reps    ABduction  AROM;10 reps    Other Sidelying Exercises  protraction, A/ROM, 10X    Other Sidelying Exercises  horizontal abduction, A/ROM, 10X      Shoulder Exercises: Standing   Horizontal ABduction  AROM;10 reps    Flexion  AROM;10 reps    ABduction  AROM;10 reps    Extension  Theraband;10  reps    Theraband Level (Shoulder Extension)  Level 2 (Red)    Row  Theraband;10 reps    Theraband Level (Shoulder Row)  Level 2 (Red)    Retraction  Theraband;10 reps    Theraband Level (Shoulder Retraction)  Level 2 (Red)      Shoulder Exercises: ROM/Strengthening   X to V Arms  10X    Ball on Wall  1' flexion 1' abduction green ball      Manual Therapy   Manual Therapy  Myofascial release    Manual therapy comments  Completed separately from therapeutic exercise    Myofascial Release  myofascial release to left upper arm, trapezius, and scapularis regions to decrease pain and fascial restrictions and increase joint range of motion    Muscle Energy Technique  muscle energy technique to left shoulder region for flexion and er to decrease muscle spasms and improve joint ROM during passive stretching               OT Short Term Goals - 11/22/17 1419      OT SHORT TERM GOAL #1   Title  Pt will be provided with and educated on HEP to improve mobility required for functional task completion.     Time  4    Period  Weeks    Status  On-going      OT SHORT TERM GOAL #2   Title  Pt will decrease pain in LUE to 3/10 or less to improve ability to sleep.     Time  4    Period  Weeks    Status  On-going      OT SHORT TERM GOAL #3   Title  Pt will decrease LUE fascial restrictions to min amounts or less to improve ability to perform overhead reaching tasks.     Time  4    Period  Weeks    Status  On-going      OT SHORT TERM GOAL #4   Title  Pt will improve LUE A/ROM to WNL to improve ability to donn and doff bra.     Time  4  Period  Weeks    Status  On-going      OT SHORT TERM GOAL #5   Title  Pt will improve LUE strength to 5/5 to increase ability to lift weighted objects or complete sustained overhead tasks.     Time  4    Period  Weeks    Status  On-going               Plan - 12/14/17 1445    Clinical Impression Statement  A: Continued with manual  therapy to address fascial restrictions along anterior deltoid, trapezius, and scapularis regions to improve ROM and pain. Added x to v arms and completed standing exercises with A/ROM, added sidelying exercises-pt able to complete without compensating with trapezius. Verbal cuing for form and technique. Pt reports HEP is going well.     Plan  P: Reassess/recert, complete FOTOContinue with A/ROM in sidelying and provide for HEP       Patient will benefit from skilled therapeutic intervention in order to improve the following deficits and impairments:  Decreased activity tolerance, Decreased strength, Decreased range of motion, Pain, Increased fascial restrictions, Impaired UE functional use  Visit Diagnosis: Acute pain of left shoulder  Stiffness of left shoulder, not elsewhere classified  Other symptoms and signs involving the musculoskeletal system    Problem List Patient Active Problem List   Diagnosis Date Noted  . Hidradenitis suppurativa 02/24/2017  . 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 Morbid obesity ([place]) 06/29/2013  . GERD (gastroesophageal reflux disease) 04/25/2013   Guadelupe Sabin, OTR/L  [phone] 12/14/2017, 2:48 PM  [place], [place], [address] Phone: [phone]   Fax:  [phone]  Name: Mary Davenport MRN: [id number] Date of Birth: 1976/03/16

## 2017-12-19 ENCOUNTER — Ambulatory Visit (HOSPITAL_COMMUNITY): Payer: 59

## 2017-12-19 DIAGNOSIS — M25612 Stiffness of left shoulder, not elsewhere classified: Secondary | ICD-10-CM

## 2017-12-19 DIAGNOSIS — R29898 Other symptoms and signs involving the musculoskeletal system: Secondary | ICD-10-CM

## 2017-12-19 DIAGNOSIS — M25512 Pain in left shoulder: Secondary | ICD-10-CM | POA: Diagnosis not present

## 2017-12-19 NOTE — Therapy (Signed)
[place], [place], [address] Phone: [phone]   Fax:  [phone]  Occupational Therapy Treatment And reassessment Patient Details  Name: Mary Davenport MRN: [id number] Date of Birth: October 26, 1975 Referring Provider: Dr. Vickki Hearing   Encounter Date: 12/19/2017  OT End of Session - 12/19/17 1650    Visit Number  7    Number of Visits  9    Date for OT Re-Evaluation  01/18/18    Authorization Type  [employer]; $25 copay    Authorization Time Period  40 visit limit    Authorization - Visit Number  7    Authorization - Number of Visits  40    OT Start Time  0076    OT Stop Time  1645    OT Time Calculation (min)  38 min    Activity Tolerance  Patient tolerated treatment well    Behavior During Therapy  [facility] for tasks assessed/performed       Past Medical History:  Diagnosis Date  . Allergy    tx. occ. with OTC meds  . Arthritis    feet, back, hips  . Asthma    has been controlled, not rescue inhaler at present  . Carpal tunnel syndrome   . Complication of anesthesia    coming out of anesthesia with D & C-combative,not breathing, when allowed to come out slower, this was not an issue  . Depression   . Endometriosis of ovary    more resolved since hysterectomy  . GERD (gastroesophageal reflux disease)   . Headache(784.0)    hx. migraines, none in 5 yrs  . Heart murmur   . Hiatal hernia   . Hidradenitis suppurativa    followed by [facility]   . MRSA carrier   . Obesity   . Ovarian cyst    left   . Plantar fasciitis 2009  . PONV (postoperative nausea and vomiting)   . Psoriatic arthritis ([place])    followed by Dr. Berna Bue at [facility]     Past Surgical History:  Procedure Laterality Date  . CHOLECYSTECTOMY     laparoscopic  . DIAGNOSTIC LAPAROSCOPY  8/96   lysis/endometriosis (LKS)  . DILATION AND CURETTAGE OF UTERUS    . LAPAROSCOPIC GASTRIC  SLEEVE RESECTION N/A 07/03/2013   Procedure: LAPAROSCOPIC GASTRIC SLEEVE RESECTION;  Surgeon: Pedro Earls, MD;  [facility];  Service: General;  Laterality: N/A;  . LAPAROSCOPIC TOTAL HYSTERECTOMY  07/2010   robotic assisted  . TONSILLECTOMY    . UPPER GI ENDOSCOPY  07/03/2013   Procedure: UPPER GI ENDOSCOPY;  Surgeon: Pedro Earls, MD;  [facility];  Service: General;;    There were no vitals filed for this visit.      [facility] OT Assessment - 12/19/17 1611      Assessment   Medical Diagnosis  left adhesive capsulitis    Onset Date/Surgical Date  09/06/17      Precautions   Precautions  None      Prior Function   Level of Independence  Independent      Observation/Other Assessments   Focus on Therapeutic Outcomes (FOTO)   58/100      AROM   Overall AROM Comments  Assessed seated, er/IR abducted    AROM Assessment Site  Shoulder    Right/Left Shoulder  Left    Left Shoulder Flexion  125 Degrees previous: 101    Left Shoulder ABduction  115 Degrees previous: 91    Left Shoulder Internal Rotation  45 Degrees prevous: 60    Left Shoulder External Rotation  57 Degrees previous; 27      PROM   Overall PROM Comments  Assessed supine, er/IR adducted    PROM Assessment Site  Shoulder    Right/Left Shoulder  Left    Left Shoulder Flexion  148 Degrees previous; 126    Left Shoulder ABduction  180 Degrees previous: 101    Left Shoulder Internal Rotation  90 Degrees previous: same    Left Shoulder External Rotation  55 Degrees previous: 38      Strength   Overall Strength Comments  Assessed seated, er/IR adducted    Strength Assessment Site  Shoulder    Right/Left Shoulder  Left    Left Shoulder Flexion  4+/5 previous: 4+/5    Left Shoulder ABduction  4/5 previous: same    Left Shoulder Internal Rotation  4/5 previous: 4/5    Left Shoulder External Rotation  4/5 previous: 4-/5               OT Treatments/Exercises (OP) - 12/19/17 1624       Exercises   Exercises  Shoulder      Shoulder Exercises: Supine   Protraction  PROM;5 reps    Horizontal ABduction  PROM;5 reps;AAROM;12 reps 2# dowel rod    External Rotation  PROM;5 reps    Internal Rotation  PROM;5 reps    Flexion  PROM;5 reps;AAROM;12 reps 2# dowel rod    ABduction  PROM;5 reps      Shoulder Exercises: Sidelying   External Rotation  AROM;12 reps    Internal Rotation  AROM;12 reps    Flexion  AROM;12 reps    ABduction  AROM;12 reps    Other Sidelying Exercises  protraction, A/ROM, 12X    Other Sidelying Exercises  horizontal abduction, A/ROM, 12X      Shoulder Exercises: ROM/Strengthening   X to V Arms  12X      Manual Therapy   Manual Therapy  Myofascial release    Manual therapy comments  Completed separately from therapeutic exercise    Myofascial Release  myofascial release to left upper arm, trapezius, and scapularis regions to decrease pain and fascial restrictions and increase joint range of motion             OT Education - 12/19/17 1644    Education provided  Yes    Education Details  Verbally instructed patient to complete sidelying A/ROM.     Person(s) Educated  Patient    Methods  Explanation;Demonstration;Handout    Comprehension  Verbalized understanding;Returned demonstration       OT Short Term Goals - 12/19/17 1634      OT SHORT TERM GOAL #1   Title  Pt will be provided with and educated on HEP to improve mobility required for functional task completion.     Time  4    Period  Weeks    Status  Achieved      OT SHORT TERM GOAL #2   Title  Pt will decrease pain in LUE to 3/10 or less to improve ability to sleep.     Baseline  4/29: Patient has been averaging a 5/10.    Time  4    Period  Weeks    Status  On-going      OT SHORT TERM GOAL #3   Title  Pt will decrease LUE fascial restrictions  to min amounts or less to improve ability to perform overhead reaching tasks.     Time  4    Period  Weeks    Status  Achieved       OT SHORT TERM GOAL #4   Title  Pt will improve LUE A/ROM to WNL to improve ability to donn and doff bra.     Time  4    Period  Weeks    Status  On-going      OT SHORT TERM GOAL #5   Title  Pt will improve LUE strength to 5/5 to increase ability to lift weighted objects or complete sustained overhead tasks.     Time  4    Period  Weeks    Status  On-going               Plan - 12/19/17 1650    Clinical Impression Statement  A: Reassessment completed this date. patient has met 1 therapy goal at this point. She reports that she is able to notice an improvement with shoulder ROM although she continues to have difficulty with managing her bra and reaching above shoulder level. Added sidelying shoulder A/ROM to HEP. Patient continues to have pain with any passive stretching which is a limiting factor for her progress towards gaining full range.     Plan  P: Continue therapy services 2x a week for 4 more weeks focusing on strength, ROM, fascial restrictions, and pain management of left UE. Complete shoulder stretching next session. Add overhead lacing if able to complete.        Patient will benefit from skilled therapeutic intervention in order to improve the following deficits and impairments:  Decreased activity tolerance, Decreased strength, Decreased range of motion, Pain, Increased fascial restrictions, Impaired UE functional use  Visit Diagnosis: Acute pain of left shoulder - Plan: Ot plan of care cert/re-cert  Stiffness of left shoulder, not elsewhere classified - Plan: Ot plan of care cert/re-cert  Other symptoms and signs involving the musculoskeletal system - Plan: Ot plan of care cert/re-cert    Problem List Patient Active Problem List   Diagnosis Date Noted  . Hidradenitis suppurativa 02/24/2017  . 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 Morbid obesity ([place]) 06/29/2013  . GERD (gastroesophageal reflux disease) 04/25/2013    Ailene Ravel, OTR/L,CBIS  [phone]  12/19/2017, 5:05 PM  [place] [address] [place], [place], [address] Phone: [phone]   Fax:  [phone]  Name: Mary Davenport MRN: [id number] Date of Birth: August 31, 1975

## 2017-12-21 ENCOUNTER — Ambulatory Visit (HOSPITAL_COMMUNITY): Payer: 59 | Attending: Sports Medicine | Admitting: Occupational Therapy

## 2017-12-21 ENCOUNTER — Encounter (HOSPITAL_COMMUNITY): Payer: Self-pay | Admitting: Occupational Therapy

## 2017-12-21 DIAGNOSIS — M25512 Pain in left shoulder: Secondary | ICD-10-CM

## 2017-12-21 DIAGNOSIS — M25612 Stiffness of left shoulder, not elsewhere classified: Secondary | ICD-10-CM | POA: Diagnosis not present

## 2017-12-21 DIAGNOSIS — R29898 Other symptoms and signs involving the musculoskeletal system: Secondary | ICD-10-CM | POA: Insufficient documentation

## 2017-12-21 NOTE — Therapy (Signed)
[place], [place], [address] Phone: [phone]   Fax:  [phone]  Occupational Therapy Treatment  Patient Details  Name: Mary Davenport MRN: [id number] Date of Birth: [birth year] Referring Provider: Dr. Vickki Hearing   Encounter Date: 12/21/2017  OT End of Session - 12/21/17 1704    Visit Number  8    Number of Visits  15    Date for OT Re-Evaluation  01/18/18    Authorization Type  [employer]; $25 copay    Authorization Time Period  40 visit limit    Authorization - Visit Number  8    Authorization - Number of Visits  40    OT Start Time  5597    OT Stop Time  1731    OT Time Calculation (min)  50 min    Activity Tolerance  Patient tolerated treatment well    Behavior During Therapy  [facility] for tasks assessed/performed       Past Medical History:  Diagnosis Date  . Allergy    tx. occ. with OTC meds  . Arthritis    feet, back, hips  . Asthma    has been controlled, not rescue inhaler at present  . Carpal tunnel syndrome   . Complication of anesthesia    coming out of anesthesia with D & C-combative,not breathing, when allowed to come out slower, this was not an issue  . Depression   . Endometriosis of ovary    more resolved since hysterectomy  . GERD (gastroesophageal reflux disease)   . Headache(784.0)    hx. migraines, none in 5 yrs  . Heart murmur   . Hiatal hernia   . Hidradenitis suppurativa    followed by [facility]   . MRSA carrier   . Obesity   . Ovarian cyst    left   . Plantar fasciitis 2009  . PONV (postoperative nausea and vomiting)   . Psoriatic arthritis ([place])    followed by Dr. Berna Bue at [facility]     Past Surgical History:  Procedure Laterality Date  . CHOLECYSTECTOMY     laparoscopic  . DIAGNOSTIC LAPAROSCOPY  8/96   lysis/endometriosis (LKS)  . DILATION AND CURETTAGE OF UTERUS    . LAPAROSCOPIC GASTRIC SLEEVE RESECTION  N/A 07/03/2013   Procedure: LAPAROSCOPIC GASTRIC SLEEVE RESECTION;  Surgeon: Pedro Earls, MD;  [facility];  Service: General;  Laterality: N/A;  . LAPAROSCOPIC TOTAL HYSTERECTOMY  07/2010   robotic assisted  . TONSILLECTOMY    . UPPER GI ENDOSCOPY  07/03/2013   Procedure: UPPER GI ENDOSCOPY;  Surgeon: Pedro Earls, MD;  [facility];  Service: General;;    There were no vitals filed for this visit.  Subjective Assessment - 12/21/17 1641    Subjective   S: I'm going to the doctor tomorrow.     Currently in Pain?  No/denies         [facility] OT Assessment - 12/21/17 1641      Assessment   Medical Diagnosis  left adhesive capsulitis      Precautions   Precautions  None               OT Treatments/Exercises (OP) - 12/21/17 1644      Exercises   Exercises  Shoulder      Shoulder Exercises: Supine   Protraction  PROM;5 reps    Horizontal ABduction  PROM;5 reps;AAROM;12 reps 2# dowel rod  External Rotation  PROM;5 reps    Internal Rotation  PROM;5 reps    Flexion  PROM;5 reps;AAROM;12 reps 2# dowel rod    ABduction  PROM;5 reps      Shoulder Exercises: Sidelying   External Rotation  AROM;12 reps    Internal Rotation  AROM;12 reps    Flexion  AROM;12 reps    ABduction  AROM;12 reps    Other Sidelying Exercises  protraction, A/ROM, 12X    Other Sidelying Exercises  horizontal abduction, A/ROM, 12X      Shoulder Exercises: ROM/Strengthening   Over Head Lace  1'    Other ROM/Strengthening Exercises  Passing small ball around back working on IR, 5X each direction; passing small ball around head working on er, 5X each direction      Shoulder Exercises: IT sales professional  2 reps;30 seconds    Cross Chest Stretch  2 reps;30 seconds    Internal Rotation Stretch  2 reps 30" horizontal towel    External Rotation Stretch  2 reps;30 seconds    Wall Stretch - Flexion  2 reps;30 seconds    Other Shoulder Stretches  Sleeper stretch against wall;  2x20"      Manual Therapy   Manual Therapy  Myofascial release    Manual therapy comments  Completed separately from therapeutic exercise    Myofascial Release  myofascial release to left upper arm, trapezius, and scapularis regions to decrease pain and fascial restrictions and increase joint range of motion               OT Short Term Goals - 12/19/17 1634      OT SHORT TERM GOAL #1   Title  Pt will be provided with and educated on HEP to improve mobility required for functional task completion.     Time  4    Period  Weeks    Status  Achieved      OT SHORT TERM GOAL #2   Title  Pt will decrease pain in LUE to 3/10 or less to improve ability to sleep.     Baseline  4/29: Patient has been averaging a 5/10.    Time  4    Period  Weeks    Status  On-going      OT SHORT TERM GOAL #3   Title  Pt will decrease LUE fascial restrictions to min amounts or less to improve ability to perform overhead reaching tasks.     Time  4    Period  Weeks    Status  Achieved      OT SHORT TERM GOAL #4   Title  Pt will improve LUE A/ROM to WNL to improve ability to donn and doff bra.     Time  4    Period  Weeks    Status  On-going      OT SHORT TERM GOAL #5   Title  Pt will improve LUE strength to 5/5 to increase ability to lift weighted objects or complete sustained overhead tasks.     Time  4    Period  Weeks    Status  On-going               Plan - 12/21/17 1705    Clinical Impression Statement  A: Pt reports she goes to MD tomorrow for cortisone shot. Session focusing on shoulder stretching and improving ROM, increased all stretches to 30" holds, cuing for form. Added overhead lacing and ball  passes around back and head working on IR and er. Also continued with manual therapy to address fascial restrictions limiting ROM. Verbal cuing for form with exercises.     Plan  P: Follow up on MD appt; continue with shoulder stretching, attempt child's pose working on improving  shoulder flexion       Patient will benefit from skilled therapeutic intervention in order to improve the following deficits and impairments:  Decreased activity tolerance, Decreased strength, Decreased range of motion, Pain, Increased fascial restrictions, Impaired UE functional use  Visit Diagnosis: Acute pain of left shoulder  Stiffness of left shoulder, not elsewhere classified  Other symptoms and signs involving the musculoskeletal system    Problem List Patient Active Problem List   Diagnosis Date Noted  . Hidradenitis suppurativa 02/24/2017  . 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 Morbid obesity ([place]) 06/29/2013  . GERD (gastroesophageal reflux disease) 04/25/2013   Mary Davenport, OTR/L  [phone] 12/21/2017, 5:41 PM  [place], [place], [address] Phone: [phone]   Fax:  [phone]  Name: Mary Davenport MRN: [id number] Date of Birth: 1975-12-13

## 2017-12-22 DIAGNOSIS — M7502 Adhesive capsulitis of left shoulder: Secondary | ICD-10-CM | POA: Diagnosis not present

## 2017-12-22 DIAGNOSIS — M25512 Pain in left shoulder: Secondary | ICD-10-CM | POA: Diagnosis not present

## 2017-12-27 ENCOUNTER — Encounter (HOSPITAL_COMMUNITY): Payer: Self-pay | Admitting: Occupational Therapy

## 2017-12-27 ENCOUNTER — Ambulatory Visit (HOSPITAL_COMMUNITY): Payer: 59 | Admitting: Occupational Therapy

## 2017-12-27 DIAGNOSIS — M25512 Pain in left shoulder: Secondary | ICD-10-CM | POA: Diagnosis not present

## 2017-12-27 DIAGNOSIS — M25612 Stiffness of left shoulder, not elsewhere classified: Secondary | ICD-10-CM

## 2017-12-27 DIAGNOSIS — R29898 Other symptoms and signs involving the musculoskeletal system: Secondary | ICD-10-CM

## 2017-12-27 NOTE — Therapy (Signed)
[place], [place], [address] Phone: [phone]   Fax:  [phone]  Occupational Therapy Treatment  Patient Details  Name: Mary Davenport MRN: [id number] Date of Birth: August 01, 1976 Referring Provider: Dr. Vickki Hearing   Encounter Date: 12/27/2017  OT End of Session - 12/27/17 1740    Visit Number  9    Number of Visits  15    Date for OT Re-Evaluation  01/18/18    Authorization Type  [employer]; $25 copay    Authorization Time Period  40 visit limit    Authorization - Visit Number  9    Authorization - Number of Visits  40    OT Start Time  8280    OT Stop Time  1731    OT Time Calculation (min)  42 min    Activity Tolerance  Patient tolerated treatment well    Behavior During Therapy  [facility] for tasks assessed/performed       Past Medical History:  Diagnosis Date  . Allergy    tx. occ. with OTC meds  . Arthritis    feet, back, hips  . Asthma    has been controlled, not rescue inhaler at present  . Carpal tunnel syndrome   . Complication of anesthesia    coming out of anesthesia with D & C-combative,not breathing, when allowed to come out slower, this was not an issue  . Depression   . Endometriosis of ovary    more resolved since hysterectomy  . GERD (gastroesophageal reflux disease)   . Headache(784.0)    hx. migraines, none in 5 yrs  . Heart murmur   . Hiatal hernia   . Hidradenitis suppurativa    followed by [facility]   . MRSA carrier   . Obesity   . Ovarian cyst    left   . Plantar fasciitis 2009  . PONV (postoperative nausea and vomiting)   . Psoriatic arthritis ([place])    followed by Dr. Berna Bue at [facility]     Past Surgical History:  Procedure Laterality Date  . CHOLECYSTECTOMY     laparoscopic  . DIAGNOSTIC LAPAROSCOPY  8/96   lysis/endometriosis (LKS)  . DILATION AND CURETTAGE OF UTERUS    . LAPAROSCOPIC GASTRIC SLEEVE RESECTION  N/A 07/03/2013   Procedure: LAPAROSCOPIC GASTRIC SLEEVE RESECTION;  Surgeon: Pedro Earls, MD;  Location: [facility];  Service: General;  Laterality: N/A;  . LAPAROSCOPIC TOTAL HYSTERECTOMY  07/2010   robotic assisted  . TONSILLECTOMY    . UPPER GI ENDOSCOPY  07/03/2013   Procedure: UPPER GI ENDOSCOPY;  Surgeon: Pedro Earls, MD;  Location: [facility];  Service: General;;    There were no vitals filed for this visit.  Subjective Assessment - 12/27/17 1646    Subjective   S: The doctor gave me a shot and filled me with saline.     Currently in Pain?  No/denies         [facility] OT Assessment - 12/27/17 1646      Assessment   Medical Diagnosis  left adhesive capsulitis      Precautions   Precautions  None               OT Treatments/Exercises (OP) - 12/27/17 1651      Exercises   Exercises  Shoulder      Shoulder Exercises: Supine   Protraction  PROM;5 reps    Horizontal ABduction  PROM;5  reps    External Rotation  PROM;5 reps    Internal Rotation  PROM;5 reps    Flexion  PROM;5 reps    ABduction  PROM;5 reps      Shoulder Exercises: Sidelying   External Rotation  AROM;12 reps    Internal Rotation  AROM;12 reps    Flexion  AROM;12 reps    ABduction  AROM;12 reps    Other Sidelying Exercises  protraction, A/ROM, 12X    Other Sidelying Exercises  horizontal abduction, A/ROM, 12X      Shoulder Exercises: Standing   Extension  Theraband;12 reps    Theraband Level (Shoulder Extension)  Level 2 (Red)    Row  Theraband;12 reps    Theraband Level (Shoulder Row)  Level 2 (Red)    Retraction  Theraband;12 reps    Theraband Level (Shoulder Retraction)  Level 2 (Red)      Shoulder Exercises: ROM/Strengthening   X to V Arms  12X    Ball on Wall  1' flexion 1' abduction green ball    Prot/Ret//Elev/Dep  1'    Other ROM/Strengthening Exercises  Passing small ball around back working on IR, 10X each direction; passing small ball around head working on er, 10X each  direction    Other ROM/Strengthening Exercises  Child's pose, 3 reps, 20" each      Shoulder Exercises: Stretch   Internal Rotation Stretch  3 reps 20"    External Rotation Stretch  3 reps;20 seconds      Manual Therapy   Manual Therapy  Myofascial release    Manual therapy comments  Completed separately from therapeutic exercise    Myofascial Release  myofascial release to left upper arm, trapezius, and scapularis regions to decrease pain and fascial restrictions and increase joint range of motion               OT Short Term Goals - 12/19/17 1634      OT SHORT TERM GOAL #1   Title  Pt will be provided with and educated on HEP to improve mobility required for functional task completion.     Time  4    Period  Weeks    Status  Achieved      OT SHORT TERM GOAL #2   Title  Pt will decrease pain in LUE to 3/10 or less to improve ability to sleep.     Baseline  4/29: Patient has been averaging a 5/10.    Time  4    Period  Weeks    Status  On-going      OT SHORT TERM GOAL #3   Title  Pt will decrease LUE fascial restrictions to min amounts or less to improve ability to perform overhead reaching tasks.     Time  4    Period  Weeks    Status  Achieved      OT SHORT TERM GOAL #4   Title  Pt will improve LUE A/ROM to WNL to improve ability to donn and doff bra.     Time  4    Period  Weeks    Status  On-going      OT SHORT TERM GOAL #5   Title  Pt will improve LUE strength to 5/5 to increase ability to lift weighted objects or complete sustained overhead tasks.     Time  4    Period  Weeks    Status  On-going  Plan - 12/27/17 1740    Clinical Impression Statement  A: Pt reports MD gave her a cortisone shot and filled joint cavity with saline to attempt to break up the adhesions. Pt does have improved flexion and IR, and tolerance to passive stretching, er continues to be extremely limited. Added child's pose this session, also resumed scapular  theraband to improve scapular mobility and open shoulders. Verbal cuing for form and technique during exercises.     Plan  P: Continue with child's pose and working on improving flexion and er       Patient will benefit from skilled therapeutic intervention in order to improve the following deficits and impairments:  Decreased activity tolerance, Decreased strength, Decreased range of motion, Pain, Increased fascial restrictions, Impaired UE functional use  Visit Diagnosis: Acute pain of left shoulder  Stiffness of left shoulder, not elsewhere classified  Other symptoms and signs involving the musculoskeletal system    Problem List Patient Active Problem List   Diagnosis Date Noted  . Hidradenitis suppurativa 02/24/2017  . 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 Morbid obesity ([place]) 06/29/2013  . GERD (gastroesophageal reflux disease) 04/25/2013   Guadelupe Sabin, OTR/L  [phone] 12/27/2017, 5:47 PM  [place], [place], [address] Phone: [phone]   Fax:  [phone]  Name: Mary Davenport MRN: [id number] Date of Birth: 04/23/1976

## 2017-12-29 ENCOUNTER — Ambulatory Visit (HOSPITAL_COMMUNITY): Payer: 59 | Admitting: Occupational Therapy

## 2017-12-29 ENCOUNTER — Encounter (HOSPITAL_COMMUNITY): Payer: Self-pay | Admitting: Occupational Therapy

## 2017-12-29 DIAGNOSIS — M25512 Pain in left shoulder: Secondary | ICD-10-CM | POA: Diagnosis not present

## 2017-12-29 DIAGNOSIS — M25612 Stiffness of left shoulder, not elsewhere classified: Secondary | ICD-10-CM

## 2017-12-29 DIAGNOSIS — R29898 Other symptoms and signs involving the musculoskeletal system: Secondary | ICD-10-CM

## 2017-12-29 NOTE — Therapy (Signed)
[name], [place], [address] Phone: [phone]   Fax:  [phone]  Occupational Therapy Treatment  Patient Details  Name: Mary Davenport MRN: [id number] Date of Birth: [birth year] Referring Provider: Dr. Vickki Hearing   Encounter Date: 12/29/2017  OT End of Session - 12/29/17 1732    Visit Number  10    Number of Visits  15    Date for OT Re-Evaluation  01/18/18    Authorization Type  [employer]; $25 copay    Authorization Time Period  40 visit limit    Authorization - Visit Number  10    Authorization - Number of Visits  40    OT Start Time  5809    OT Stop Time  1645    OT Time Calculation (min)  41 min    Activity Tolerance  Patient tolerated treatment well    Behavior During Therapy  [facility] for tasks assessed/performed       Past Medical History:  Diagnosis Date  . Allergy    tx. occ. with OTC meds  . Arthritis    feet, back, hips  . Asthma    has been controlled, not rescue inhaler at present  . Carpal tunnel syndrome   . Complication of anesthesia    coming out of anesthesia with D & C-combative,not breathing, when allowed to come out slower, this was not an issue  . Depression   . Endometriosis of ovary    more resolved since hysterectomy  . GERD (gastroesophageal reflux disease)   . Headache(784.0)    hx. migraines, none in 5 yrs  . Heart murmur   . Hiatal hernia   . Hidradenitis suppurativa    followed by [facility]   . MRSA carrier   . Obesity   . Ovarian cyst    left   . Plantar fasciitis 2009  . PONV (postoperative nausea and vomiting)   . Psoriatic arthritis ([place])    followed by Dr. Berna Bue at [facility]     Past Surgical History:  Procedure Laterality Date  . CHOLECYSTECTOMY     laparoscopic  . DIAGNOSTIC LAPAROSCOPY  8/96   lysis/endometriosis (LKS)  . DILATION AND CURETTAGE OF UTERUS    . LAPAROSCOPIC GASTRIC SLEEVE  RESECTION N/A 07/03/2013   Procedure: LAPAROSCOPIC GASTRIC SLEEVE RESECTION;  Surgeon: Pedro Earls, MD;  [facility];  Service: General;  Laterality: N/A;  . LAPAROSCOPIC TOTAL HYSTERECTOMY  07/2010   robotic assisted  . TONSILLECTOMY    . UPPER GI ENDOSCOPY  07/03/2013   Procedure: UPPER GI ENDOSCOPY;  Surgeon: Pedro Earls, MD;  [facility]: [facility];  Service: General;;    There were no vitals filed for this visit.  Subjective Assessment - 12/29/17 1605    Subjective   S: It's still stuck.     Currently in Pain?  No/denies         [facility] OT Assessment - 12/29/17 1605      Assessment   Medical Diagnosis  left adhesive capsulitis      Precautions   Precautions  None               OT Treatments/Exercises (OP) - 12/29/17 1605      Exercises   Exercises  Shoulder      Shoulder Exercises: Supine   Protraction  PROM;5 reps    Horizontal ABduction  PROM;5 reps    External Rotation  PROM;5  reps    Internal Rotation  PROM;5 reps    Flexion  PROM;5 reps    ABduction  PROM;5 reps      Shoulder Exercises: Sidelying   External Rotation  AROM;15 reps    Internal Rotation  AROM;15 reps    Flexion  AROM;15 reps    ABduction  AROM;15 reps    Other Sidelying Exercises  protraction, A/ROM, 15X    Other Sidelying Exercises  horizontal abduction, A/ROM, 15X      Shoulder Exercises: Standing   Protraction  AROM;12 reps    Horizontal ABduction  AROM;12 reps    External Rotation  AROM;12 reps    Flexion  AROM;12 reps    ABduction  AROM;12 reps      Shoulder Exercises: ROM/Strengthening   X to V Arms  12X    Other ROM/Strengthening Exercises  Modified plank plus-10X    Other ROM/Strengthening Exercises  Child's pose, 3 reps, 25" each      Shoulder Exercises: Stretch   External Rotation Stretch  3 reps;20 seconds at table      Manual Therapy   Manual Therapy  Myofascial release    Manual therapy comments  Completed separately from therapeutic exercise     Myofascial Release  myofascial release to left upper arm, trapezius, and scapularis regions to decrease pain and fascial restrictions and increase joint range of motion               OT Short Term Goals - 12/19/17 1634      OT SHORT TERM GOAL #1   Title  Pt will be provided with and educated on HEP to improve mobility required for functional task completion.     Time  4    Period  Weeks    Status  Achieved      OT SHORT TERM GOAL #2   Title  Pt will decrease pain in LUE to 3/10 or less to improve ability to sleep.     Baseline  4/29: Patient has been averaging a 5/10.    Time  4    Period  Weeks    Status  On-going      OT SHORT TERM GOAL #3   Title  Pt will decrease LUE fascial restrictions to min amounts or less to improve ability to perform overhead reaching tasks.     Time  4    Period  Weeks    Status  Achieved      OT SHORT TERM GOAL #4   Title  Pt will improve LUE A/ROM to WNL to improve ability to donn and doff bra.     Time  4    Period  Weeks    Status  On-going      OT SHORT TERM GOAL #5   Title  Pt will improve LUE strength to 5/5 to increase ability to lift weighted objects or complete sustained overhead tasks.     Time  4    Period  Weeks    Status  On-going               Plan - 12/29/17 1732    Clinical Impression Statement  A: Continued with manual therapy to address fascial restrictions and adhesions limiting ROM. Continued with sidelying and standing A/ROM, flexion and er stretching. Pt with slight improvement in er today, also added modified plank plus for scapular mobility. Verbal cuing for form and technique.     Plan  P: Continue working to  improve ROM required for functional reaching tasks       Patient will benefit from skilled therapeutic intervention in order to improve the following deficits and impairments:  Decreased activity tolerance, Decreased strength, Decreased range of motion, Pain, Increased fascial restrictions,  Impaired UE functional use  Visit Diagnosis: Acute pain of left shoulder  Stiffness of left shoulder, not elsewhere classified  Other symptoms and signs involving the musculoskeletal system    Problem List Patient Active Problem List   Diagnosis Date Noted  . Hidradenitis suppurativa 02/24/2017  . 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 Morbid obesity ([place]) 06/29/2013  . GERD (gastroesophageal reflux disease) 04/25/2013   Guadelupe Sabin, OTR/L  [phone] 12/29/2017, 5:34 PM  [place], [place], [address] Phone: [phone]   Fax:  [phone]  Name: Mary Davenport MRN: [id number] Date of Birth: Nov 14, 1975

## 2018-01-02 DIAGNOSIS — H8101 Meniere's disease, right ear: Secondary | ICD-10-CM | POA: Diagnosis not present

## 2018-01-02 DIAGNOSIS — H9041 Sensorineural hearing loss, unilateral, right ear, with unrestricted hearing on the contralateral side: Secondary | ICD-10-CM | POA: Diagnosis not present

## 2018-01-03 ENCOUNTER — Ambulatory Visit (HOSPITAL_COMMUNITY): Payer: 59 | Admitting: Occupational Therapy

## 2018-01-03 ENCOUNTER — Encounter (HOSPITAL_COMMUNITY): Payer: Self-pay | Admitting: Occupational Therapy

## 2018-01-03 DIAGNOSIS — M25512 Pain in left shoulder: Secondary | ICD-10-CM | POA: Diagnosis not present

## 2018-01-03 DIAGNOSIS — R29898 Other symptoms and signs involving the musculoskeletal system: Secondary | ICD-10-CM

## 2018-01-03 DIAGNOSIS — M25612 Stiffness of left shoulder, not elsewhere classified: Secondary | ICD-10-CM

## 2018-01-03 NOTE — Therapy (Signed)
[place], [place], [address] Phone: [phone]   Fax:  [phone]  Occupational Therapy Treatment  Patient Details  Name: Mary Davenport MRN: [id number] Date of Birth: 1976-04-28 Referring Provider: Dr. Vickki Hearing   Encounter Date: 01/03/2018  OT End of Session - 01/03/18 1729    Visit Number  11    Number of Visits  15    Date for OT Re-Evaluation  01/18/18    Authorization Type  [employer]; $25 copay    Authorization Time Period  40 visit limit    Authorization - Visit Number  11    Authorization - Number of Visits  40    OT Start Time  9381    OT Stop Time  1728    OT Time Calculation (min)  46 min    Activity Tolerance  Patient tolerated treatment well    Behavior During Therapy  [facility] for tasks assessed/performed       Past Medical History:  Diagnosis Date  . Allergy    tx. occ. with OTC meds  . Arthritis    feet, back, hips  . Asthma    has been controlled, not rescue inhaler at present  . Carpal tunnel syndrome   . Complication of anesthesia    coming out of anesthesia with D & C-combative,not breathing, when allowed to come out slower, this was not an issue  . Depression   . Endometriosis of ovary    more resolved since hysterectomy  . GERD (gastroesophageal reflux disease)   . Headache(784.0)    hx. migraines, none in 5 yrs  . Heart murmur   . Hiatal hernia   . Hidradenitis suppurativa    followed by [facility]   . MRSA carrier   . Obesity   . Ovarian cyst    left   . Plantar fasciitis 2009  . PONV (postoperative nausea and vomiting)   . Psoriatic arthritis ([place])    followed by Dr. Berna Bue at [facility]     Past Surgical History:  Procedure Laterality Date  . CHOLECYSTECTOMY     laparoscopic  . DIAGNOSTIC LAPAROSCOPY  8/96   lysis/endometriosis ([facility]KS)  . DILATION AND CURETTAGE OF UTERUS    . LAPAROSCOPIC GASTRIC SLEEVE  RESECTION N/A 07/03/2013   Procedure: LAPAROSCOPIC GASTRIC SLEEVE RESECTION;  Surgeon: Pedro Earls, MD;  Location: [facility];  Service: General;  Laterality: N/A;  . LAPAROSCOPIC TOTAL HYSTERECTOMY  07/2010   robotic assisted  . TONSILLECTOMY    . UPPER GI ENDOSCOPY  07/03/2013   Procedure: UPPER GI ENDOSCOPY;  Surgeon: Pedro Earls, MD;  [facility];  Service: General;;    There were no vitals filed for this visit.  Subjective Assessment - 01/03/18 1643    Subjective   S: I did some yoga last night.     Currently in Pain?  No/denies         [facility] OT Assessment - 01/03/18 1643      Assessment   Medical Diagnosis  left adhesive capsulitis      Precautions   Precautions  None               OT Treatments/Exercises (OP) - 01/03/18 1643      Exercises   Exercises  Shoulder      Shoulder Exercises: Supine   Protraction  PROM;5 reps    Horizontal ABduction  PROM;5 reps    External  Rotation  PROM;5 reps    Internal Rotation  PROM;5 reps    Flexion  PROM;5 reps    ABduction  PROM;5 reps      Shoulder Exercises: Sidelying   External Rotation  Strengthening;15 reps    External Rotation Weight (lbs)  1    Internal Rotation  Strengthening;15 reps    Internal Rotation Weight (lbs)  1    Flexion  Strengthening;15 reps    Flexion Weight (lbs)  1    ABduction  Strengthening;15 reps    ABduction Weight (lbs)  1    Other Sidelying Exercises  protraction, 15X, 1#    Other Sidelying Exercises  horizontal abduction, 15X, 1#      Shoulder Exercises: Standing   Protraction  AROM;12 reps    Horizontal ABduction  AROM;12 reps    External Rotation  AROM;12 reps    Flexion  AROM;12 reps    ABduction  AROM;12 reps      Shoulder Exercises: ROM/Strengthening   Over Head Lace  2'    X to V Arms  12X    Ball on Wall  1' flexion 1' abduction green ball    Other ROM/Strengthening Exercises  Modified plank plus at countertop-10X    Other ROM/Strengthening Exercises   Child's pose, 3 reps, 25" each      Shoulder Exercises: Stretch   External Rotation Stretch  3 reps;20 seconds      Manual Therapy   Manual Therapy  Myofascial release    Manual therapy comments  Completed separately from therapeutic exercise    Myofascial Release  myofascial release to left upper arm, trapezius, and scapularis regions to decrease pain and fascial restrictions and increase joint range of motion               OT Short Term Goals - 12/19/17 1634      OT SHORT TERM GOAL #1   Title  Pt will be provided with and educated on HEP to improve mobility required for functional task completion.     Time  4    Period  Weeks    Status  Achieved      OT SHORT TERM GOAL #2   Title  Pt will decrease pain in LUE to 3/10 or less to improve ability to sleep.     Baseline  4/29: Patient has been averaging a 5/10.    Time  4    Period  Weeks    Status  On-going      OT SHORT TERM GOAL #3   Title  Pt will decrease LUE fascial restrictions to min amounts or less to improve ability to perform overhead reaching tasks.     Time  4    Period  Weeks    Status  Achieved      OT SHORT TERM GOAL #4   Title  Pt will improve LUE A/ROM to WNL to improve ability to donn and doff bra.     Time  4    Period  Weeks    Status  On-going      OT SHORT TERM GOAL #5   Title  Pt will improve LUE strength to 5/5 to increase ability to lift weighted objects or complete sustained overhead tasks.     Time  4    Period  Weeks    Status  On-going               Plan - 01/03/18 1729  Clinical Impression Statement  A: Continued with manual therapy to address fascial restrictions along deltoid, trapezius, and scapularis regions restricting ROM. Added 1# weight to sidelying exercises and continued with ROM exercsies and functional reaching, progressed to countertop for modified plank plus. Verbal cuing for form and technique.     Plan  P: Continue working to improve ROM for functional  reaching-complete functional reaching task with cones or clothespins       Patient will benefit from skilled therapeutic intervention in order to improve the following deficits and impairments:  Decreased activity tolerance, Decreased strength, Decreased range of motion, Pain, Increased fascial restrictions, Impaired UE functional use  Visit Diagnosis: Acute pain of left shoulder  Stiffness of left shoulder, not elsewhere classified  Other symptoms and signs involving the musculoskeletal system    Problem List Patient Active Problem List   Diagnosis Date Noted  . Hidradenitis suppurativa 02/24/2017  . 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 Morbid obesity ([place]) 06/29/2013  . GERD (gastroesophageal reflux disease) 04/25/2013   Guadelupe Sabin, OTR/L  [phone] 01/03/2018, 5:32 PM  [place], [place], [address] Phone: [phone]   Fax:  [phone]  Name: ARICA BEVILACQUA MRN: [id number] Date of Birth: Sep 12, 1975

## 2018-01-04 ENCOUNTER — Telehealth (HOSPITAL_COMMUNITY): Payer: Self-pay | Admitting: Family Medicine

## 2018-01-04 NOTE — Telephone Encounter (Signed)
01/04/18  5:38pm I left her a message asking if she could come in 5/16 at 2:30 with Mary Davenport or 4:00 with Mary Davenport.  Mary Davenport has to leave at 4:30 for an appt and we need to change this patient's appt. Time.

## 2018-01-05 ENCOUNTER — Encounter (HOSPITAL_COMMUNITY): Payer: Self-pay | Admitting: Occupational Therapy

## 2018-01-05 ENCOUNTER — Ambulatory Visit (HOSPITAL_COMMUNITY): Payer: 59 | Admitting: Occupational Therapy

## 2018-01-05 DIAGNOSIS — M25512 Pain in left shoulder: Secondary | ICD-10-CM | POA: Diagnosis not present

## 2018-01-05 DIAGNOSIS — R29898 Other symptoms and signs involving the musculoskeletal system: Secondary | ICD-10-CM

## 2018-01-05 DIAGNOSIS — M25612 Stiffness of left shoulder, not elsewhere classified: Secondary | ICD-10-CM

## 2018-01-05 NOTE — Therapy (Signed)
[place], [place], [address] Phone: [phone]   Fax:  [phone]  Occupational Therapy Treatment  Patient Details  Name: Mary Davenport MRN: [id number] Date of Birth: 18-Jun-1976 Referring Provider: Dr. Vickki Hearing   Encounter Date: 01/05/2018  OT End of Session - 01/05/18 1656    Visit Number  12    Number of Visits  15    Date for OT Re-Evaluation  01/18/18    Authorization Type  [employer]; $25 copay    Authorization Time Period  40 visit limit    Authorization - Visit Number  12    Authorization - Number of Visits  40    OT Start Time  1602    OT Stop Time  1644    OT Time Calculation (min)  42 min    Activity Tolerance  Patient tolerated treatment well    Behavior During Therapy  [facility] for tasks assessed/performed       Past Medical History:  Diagnosis Date  . Allergy    tx. occ. with OTC meds  . Arthritis    feet, back, hips  . Asthma    has been controlled, not rescue inhaler at present  . Carpal tunnel syndrome   . Complication of anesthesia    coming out of anesthesia with D & C-combative,not breathing, when allowed to come out slower, this was not an issue  . Depression   . Endometriosis of ovary    more resolved since hysterectomy  . GERD (gastroesophageal reflux disease)   . Headache(784.0)    hx. migraines, none in 5 yrs  . Heart murmur   . Hiatal hernia   . Hidradenitis suppurativa    followed by [facility]   . MRSA carrier   . Obesity   . Ovarian cyst    left   . Plantar fasciitis 2009  . PONV (postoperative nausea and vomiting)   . Psoriatic arthritis ([place])    followed by Dr. Berna Bue at [facility]     Past Surgical History:  Procedure Laterality Date  . CHOLECYSTECTOMY     laparoscopic  . DIAGNOSTIC LAPAROSCOPY  8/96   lysis/endometriosis (LKS)  . DILATION AND CURETTAGE OF UTERUS    . LAPAROSCOPIC GASTRIC SLEEVE  RESECTION N/A 07/03/2013   Procedure: LAPAROSCOPIC GASTRIC SLEEVE RESECTION;  Surgeon: Pedro Earls, MD;  Location: [facility];  Service: General;  Laterality: N/A;  . LAPAROSCOPIC TOTAL HYSTERECTOMY  07/2010   robotic assisted  . TONSILLECTOMY    . UPPER GI ENDOSCOPY  07/03/2013   Procedure: UPPER GI ENDOSCOPY;  Surgeon: Pedro Earls, MD;  Location: [facility];  Service: General;;    There were no vitals filed for this visit.  Subjective Assessment - 01/05/18 1600    Subjective   S: It hasn't been too bad the past couple of days.     Currently in Pain?  No/denies         [facility] OT Assessment - 01/05/18 1600      Assessment   Medical Diagnosis  left adhesive capsulitis      Precautions   Precautions  None               OT Treatments/Exercises (OP) - 01/05/18 1603      Exercises   Exercises  Shoulder      Shoulder Exercises: Supine   Protraction  PROM;5 reps    Horizontal ABduction  PROM;5 reps  External Rotation  PROM;5 reps    Internal Rotation  PROM;5 reps    Flexion  PROM;5 reps    ABduction  PROM;5 reps      Shoulder Exercises: Standing   Protraction  Strengthening;10 reps    Protraction Weight (lbs)  1    Horizontal ABduction  Strengthening;10 reps    Horizontal ABduction Weight (lbs)  1    External Rotation  AROM;12 reps    Flexion  Strengthening;10 reps    Shoulder Flexion Weight (lbs)  1    ABduction  Strengthening;10 reps    Shoulder ABduction Weight (lbs)  1    Extension  Theraband;12 reps    Theraband Level (Shoulder Extension)  Level 2 (Red)    Row  Theraband;12 reps    Theraband Level (Shoulder Row)  Level 2 (Red)    Retraction  Theraband;12 reps    Theraband Level (Shoulder Retraction)  Level 2 (Red)      Shoulder Exercises: ROM/Strengthening   Over Head Lace  2'    Other ROM/Strengthening Exercises  Modified plank plus at countertop-10X    Other ROM/Strengthening Exercises  Child's pose, 3 reps, 25" each      Functional Reaching  Activities   Mid Level  Pt completing functional reaching task out to the side at countertop level working on er.       Manual Therapy   Manual Therapy  Myofascial release    Manual therapy comments  Completed separately from therapeutic exercise    Myofascial Release  myofascial release to left upper arm, trapezius, and scapularis regions to decrease pain and fascial restrictions and increase joint range of motion               OT Short Term Goals - 12/19/17 1634      OT SHORT TERM GOAL #1   Title  Pt will be provided with and educated on HEP to improve mobility required for functional task completion.     Time  4    Period  Weeks    Status  Achieved      OT SHORT TERM GOAL #2   Title  Pt will decrease pain in LUE to 3/10 or less to improve ability to sleep.     Baseline  4/29: Patient has been averaging a 5/10.    Time  4    Period  Weeks    Status  On-going      OT SHORT TERM GOAL #3   Title  Pt will decrease LUE fascial restrictions to min amounts or less to improve ability to perform overhead reaching tasks.     Time  4    Period  Weeks    Status  Achieved      OT SHORT TERM GOAL #4   Title  Pt will improve LUE A/ROM to WNL to improve ability to donn and doff bra.     Time  4    Period  Weeks    Status  On-going      OT SHORT TERM GOAL #5   Title  Pt will improve LUE strength to 5/5 to increase ability to lift weighted objects or complete sustained overhead tasks.     Time  4    Period  Weeks    Status  On-going               Plan - 01/05/18 1656    Clinical Impression Statement  A: Continued with manual therapy to address fascial  restrictions limiting ROM. Pt with slight improvement in er ROM during functional tasks today. Continued with strengthening and stretching exercises to improve ROM and strength in LUE. Verbal cuing for form and technique, pt conscious of depressing trapezius for majority of session.     Plan  P: Add functional reaching  task in abduction       Patient will benefit from skilled therapeutic intervention in order to improve the following deficits and impairments:  Decreased activity tolerance, Decreased strength, Decreased range of motion, Pain, Increased fascial restrictions, Impaired UE functional use  Visit Diagnosis: Acute pain of left shoulder  Stiffness of left shoulder, not elsewhere classified  Other symptoms and signs involving the musculoskeletal system    Problem List Patient Active Problem List   Diagnosis Date Noted  . Hidradenitis suppurativa 02/24/2017  . 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 Morbid obesity ([place]) 06/29/2013  . GERD (gastroesophageal reflux disease) 04/25/2013   Guadelupe Sabin, OTR/L  [phone] 01/05/2018, 4:59 PM  [place], [place], [address] Phone: [phone]   Fax:  [phone]  Name: Mary Davenport MRN: [id number] Date of Birth: Sep 09, 1975

## 2018-01-10 ENCOUNTER — Encounter (HOSPITAL_COMMUNITY): Payer: 59 | Admitting: Occupational Therapy

## 2018-01-11 ENCOUNTER — Ambulatory Visit (HOSPITAL_COMMUNITY): Payer: 59 | Admitting: Occupational Therapy

## 2018-01-11 ENCOUNTER — Encounter (HOSPITAL_COMMUNITY): Payer: Self-pay | Admitting: Occupational Therapy

## 2018-01-11 DIAGNOSIS — M25512 Pain in left shoulder: Secondary | ICD-10-CM

## 2018-01-11 DIAGNOSIS — M25612 Stiffness of left shoulder, not elsewhere classified: Secondary | ICD-10-CM

## 2018-01-11 DIAGNOSIS — R29898 Other symptoms and signs involving the musculoskeletal system: Secondary | ICD-10-CM

## 2018-01-11 NOTE — Therapy (Signed)
[place], [place], [address] Phone: [phone]   Fax:  [phone]  Occupational Therapy Treatment  Patient Details  Name: Mary Davenport MRN: [id number] Date of Birth: January 24, 1976 Referring Provider: Dr. Vickki Hearing   Encounter Date: 01/11/2018  OT End of Session - 01/11/18 1656    Visit Number  13    Number of Visits  15    Date for OT Re-Evaluation  01/18/18    Authorization Type  [employer]; $25 copay    Authorization Time Period  40 visit limit    Authorization - Visit Number  13    Authorization - Number of Visits  40    OT Start Time  5631    OT Stop Time  1515    OT Time Calculation (min)  42 min    Activity Tolerance  Patient tolerated treatment well    Behavior During Therapy  [facility] for tasks assessed/performed       Past Medical History:  Diagnosis Date  . Allergy    tx. occ. with OTC meds  . Arthritis    feet, back, hips  . Asthma    has been controlled, not rescue inhaler at present  . Carpal tunnel syndrome   . Complication of anesthesia    coming out of anesthesia with D & C-combative,not breathing, when allowed to come out slower, this was not an issue  . Depression   . Endometriosis of ovary    more resolved since hysterectomy  . GERD (gastroesophageal reflux disease)   . Headache(784.0)    hx. migraines, none in 5 yrs  . Heart murmur   . Hiatal hernia   . Hidradenitis suppurativa    followed by [facility]   . MRSA carrier   . Obesity   . Ovarian cyst    left   . Plantar fasciitis 2009  . PONV (postoperative nausea and vomiting)   . Psoriatic arthritis ([place])    followed by Dr. Berna Bue at [facility]     Past Surgical History:  Procedure Laterality Date  . CHOLECYSTECTOMY     laparoscopic  . DIAGNOSTIC LAPAROSCOPY  8/96   lysis/endometriosis (LKS)  . DILATION AND CURETTAGE OF UTERUS    . LAPAROSCOPIC GASTRIC SLEEVE  RESECTION N/A 07/03/2013   Procedure: LAPAROSCOPIC GASTRIC SLEEVE RESECTION;  Surgeon: Pedro Earls, MD;  Location: [facility];  Service: General;  Laterality: N/A;  . LAPAROSCOPIC TOTAL HYSTERECTOMY  07/2010   robotic assisted  . TONSILLECTOMY    . UPPER GI ENDOSCOPY  07/03/2013   Procedure: UPPER GI ENDOSCOPY;  Surgeon: Pedro Earls, MD;  Location: [facility];  Service: General;;    There were no vitals filed for this visit.  Subjective Assessment - 01/11/18 1437    Subjective   S: My shoulder is a little tight from driving a lot.     Currently in Pain?  No/denies         [facility] OT Assessment - 01/11/18 1437      Assessment   Medical Diagnosis  left adhesive capsulitis      Precautions   Precautions  None               OT Treatments/Exercises (OP) - 01/11/18 1438      Exercises   Exercises  Shoulder      Shoulder Exercises: Standing   Horizontal ABduction  AROM;10 reps    External Rotation  AROM;10  reps    Flexion  AROM;10 reps    ABduction  AROM;10 reps      Shoulder Exercises: Stretch   Internal Rotation Stretch  2 reps 30"    External Rotation Stretch  2 reps;30 seconds    Wall Stretch - Flexion  2 reps;30 seconds    Wall Stretch - ABduction  2 reps;30 seconds      Functional Reaching Activities   Mid Level  Pt completing functional reaching task placing and removing 10 cones from middle shelf of cabinet in abduction      Manual Therapy   Manual Therapy  Myofascial release    Manual therapy comments  Completed separately from therapeutic exercise    Joint Mobilization  Pectoralis minor and teres major release completed to left UE to decrease trigger point and muscle tighness and increase joint mobiilty needed for functional tasks.     Myofascial Release  myofascial release to left upper arm, trapezius, and scapularis regions to decrease pain and fascial restrictions and increase joint range of motion               OT Short Term Goals -  12/19/17 1634      OT SHORT TERM GOAL #1   Title  Pt will be provided with and educated on HEP to improve mobility required for functional task completion.     Time  4    Period  Weeks    Status  Achieved      OT SHORT TERM GOAL #2   Title  Pt will decrease pain in LUE to 3/10 or less to improve ability to sleep.     Baseline  4/29: Patient has been averaging a 5/10.    Time  4    Period  Weeks    Status  On-going      OT SHORT TERM GOAL #3   Title  Pt will decrease LUE fascial restrictions to min amounts or less to improve ability to perform overhead reaching tasks.     Time  4    Period  Weeks    Status  Achieved      OT SHORT TERM GOAL #4   Title  Pt will improve LUE A/ROM to WNL to improve ability to donn and doff bra.     Time  4    Period  Weeks    Status  On-going      OT SHORT TERM GOAL #5   Title  Pt will improve LUE strength to 5/5 to increase ability to lift weighted objects or complete sustained overhead tasks.     Time  4    Period  Weeks    Status  On-going               Plan - 01/11/18 1656    Clinical Impression Statement  A: Soft tissue mobilization focusing on pectoralis major and teres minor release techniques to improve ROM and decrease muscle tightness, trace fascial restrictions palpated at upper arm and deltoid regions. Also focused on trigger point at levator scapulae. Pt with improved ROM after manual therapy. Pt improving in depressing trapezius during exercises, verbal cuing intermittently for form.     Plan  P: Continue with trigger point release and pec/teres manual work to improve ROM and decrease muscle tightness. Resume passive stretching       Patient will benefit from skilled therapeutic intervention in order to improve the following deficits and impairments:  Decreased activity tolerance, Decreased strength,  Decreased range of motion, Pain, Increased fascial restrictions, Impaired UE functional use  Visit Diagnosis: Acute pain of  left shoulder  Stiffness of left shoulder, not elsewhere classified  Other symptoms and signs involving the musculoskeletal system    Problem List Patient Active Problem List   Diagnosis Date Noted  . Hidradenitis suppurativa 02/24/2017  . 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 Morbid obesity ([place]) 06/29/2013  . GERD (gastroesophageal reflux disease) 04/25/2013   Guadelupe Sabin, OTR/L  [phone] 01/11/2018, 5:00 PM  [place], [place], [address] Phone: [phone]   Fax:  [phone]  Name: Mary Davenport MRN: [id number] Date of Birth: 1975/10/07

## 2018-01-12 ENCOUNTER — Encounter (HOSPITAL_COMMUNITY): Payer: Self-pay

## 2018-01-12 ENCOUNTER — Ambulatory Visit (HOSPITAL_COMMUNITY): Payer: 59

## 2018-01-12 ENCOUNTER — Other Ambulatory Visit: Payer: Self-pay

## 2018-01-12 DIAGNOSIS — R29898 Other symptoms and signs involving the musculoskeletal system: Secondary | ICD-10-CM

## 2018-01-12 DIAGNOSIS — M25512 Pain in left shoulder: Secondary | ICD-10-CM

## 2018-01-12 DIAGNOSIS — M25612 Stiffness of left shoulder, not elsewhere classified: Secondary | ICD-10-CM

## 2018-01-12 NOTE — Therapy (Addendum)
[place], [place], [address] Phone: [phone]   Fax:  [phone]  Occupational Therapy Treatment  Patient Details  Name: Mary Davenport MRN: [id number] Date of Birth: 08-05-1976 Referring Provider: Dr. Vickki Hearing   Encounter Date: 01/12/2018  OT End of Session - 01/12/18 1700    Visit Number  14    Number of Visits  15    Date for OT Re-Evaluation  01/18/18    Authorization Type  [employer]; $25 copay    Authorization Time Period  40 visit limit    Authorization - Visit Number  14    Authorization - Number of Visits  40    OT Start Time  4656    OT Stop Time  1731    OT Time Calculation (min)  42 min    Activity Tolerance  Patient tolerated treatment well    Behavior During Therapy  [facility] for tasks assessed/performed       Past Medical History:  Diagnosis Date  . Allergy    tx. occ. with OTC meds  . Arthritis    feet, back, hips  . Asthma    has been controlled, not rescue inhaler at present  . Carpal tunnel syndrome   . Complication of anesthesia    coming out of anesthesia with D & C-combative,not breathing, when allowed to come out slower, this was not an issue  . Depression   . Endometriosis of ovary    more resolved since hysterectomy  . GERD (gastroesophageal reflux disease)   . Headache(784.0)    hx. migraines, none in 5 yrs  . Heart murmur   . Hiatal hernia   . Hidradenitis suppurativa    followed by [facility]   . MRSA carrier   . Obesity   . Ovarian cyst    left   . Plantar fasciitis 2009  . PONV (postoperative nausea and vomiting)   . Psoriatic arthritis ([place])    followed by Dr. Berna Bue at [facility]     Past Surgical History:  Procedure Laterality Date  . CHOLECYSTECTOMY     laparoscopic  . DIAGNOSTIC LAPAROSCOPY  8/96   lysis/endometriosis (LKS)  . DILATION AND CURETTAGE OF UTERUS    . LAPAROSCOPIC GASTRIC SLEEVE  RESECTION N/A 07/03/2013   Procedure: LAPAROSCOPIC GASTRIC SLEEVE RESECTION;  Surgeon: Pedro Earls, MD;  Location: [facility];  Service: General;  Laterality: N/A;  . LAPAROSCOPIC TOTAL HYSTERECTOMY  07/2010   robotic assisted  . TONSILLECTOMY    . UPPER GI ENDOSCOPY  07/03/2013   Procedure: UPPER GI ENDOSCOPY;  Surgeon: Pedro Earls, MD;  [facility]: [facility];  Service: General;;    There were no vitals filed for this visit.  Subjective Assessment - 01/12/18 1654    Subjective   S: I am slowly getting a little more range of motion since the injection.    Currently in Pain?  No/denies         [facility] OT Assessment - 01/12/18 1655      Assessment   Medical Diagnosis  left adhesive capsulitis      Precautions   Precautions  None               OT Treatments/Exercises (OP) - 01/12/18 1655      Exercises   Exercises  Shoulder      Shoulder Exercises: Standing   Horizontal ABduction  Strengthening;Theraband;15 reps    Theraband Level (  Shoulder Horizontal ABduction)  Level 3 (Green) palms up    Diagonals  Strengthening;Theraband;15 reps    Theraband Level (Shoulder Diagonals)  Level 3 (Green)      Shoulder Exercises: ROM/Strengthening   Other ROM/Strengthening Exercises  wall slides with red theraband; 10x each side, diagonal up, diagonal down      Shoulder Exercises: Stretch   Other Shoulder Stretches  sleeper stretch for internal rotation agains the wall; 2x20"    Other Shoulder Stretches  Y arms lift off stretch standing; 2x15"      Manual Therapy   Manual Therapy  Myofascial release;Joint mobilization    Manual therapy comments  Completed separately from therapeutic exercise. Manual therapy completed by occupational therapist. Audible pop noted during prone joint mobilization techniques although patient did not voice any increased pain during session.    Joint Mobilization  Joint mobilization technique completed to left shoulder while in prone to increase ROM and  joint mobility in order to increase functional performance during daily tasks.    Myofascial Release  myofascial release to left upper arm, trapezius, and scapularis regions to decrease pain and fascial restrictions and increase joint range of motion             OT Education - 01/13/18 0807    Education provided  Yes    Education Details  Verbally instructed patient to complete horizontal abduction and diagonals with green band at home.     Person(s) Educated  Patient    Methods  Explanation;Demonstration    Comprehension  Verbalized understanding;Returned demonstration       OT Short Term Goals - 12/19/17 1634      OT SHORT TERM GOAL #1   Title  Pt will be provided with and educated on HEP to improve mobility required for functional task completion.     Time  4    Period  Weeks    Status  Achieved      OT SHORT TERM GOAL #2   Title  Pt will decrease pain in LUE to 3/10 or less to improve ability to sleep.     Baseline  4/29: Patient has been averaging a 5/10.    Time  4    Period  Weeks    Status  On-going      OT SHORT TERM GOAL #3   Title  Pt will decrease LUE fascial restrictions to min amounts or less to improve ability to perform overhead reaching tasks.     Time  4    Period  Weeks    Status  Achieved      OT SHORT TERM GOAL #4   Title  Pt will improve LUE A/ROM to WNL to improve ability to donn and doff bra.     Time  4    Period  Weeks    Status  On-going      OT SHORT TERM GOAL #5   Title  Pt will improve LUE strength to 5/5 to increase ability to lift weighted objects or complete sustained overhead tasks.     Time  4    Period  Weeks    Status  On-going               Plan - 01/12/18 1701    Clinical Impression Statement  A: Soft tissue mobilization focusing on pectoralis major and teres minor release techniques to improve ROM and decrease muscle tightness. Pt with improved ROM after manual therapy. Pt able to progress to green  theraband for  horizontal abduction and diagonals shoulder strengthening exercises. Pt given green theraband for HEP exercises.    Plan  P: Pt due for reassessment on May 29th but is scheduled on the 30th. Continue with trigger point release and pec/teres manual work to improve ROM and decrease muscle tightness. Resume passive stretching. Continue with green theraband strengthening exercises.    Consulted and Agree with Plan of Care  Patient       Patient will benefit from skilled therapeutic intervention in order to improve the following deficits and impairments:  Decreased activity tolerance, Decreased strength, Decreased range of motion, Pain, Increased fascial restrictions, Impaired UE functional use  Visit Diagnosis: Acute pain of left shoulder  Stiffness of left shoulder, not elsewhere classified  Other symptoms and signs involving the musculoskeletal system    Problem List Patient Active Problem List   Diagnosis Date Noted  . Hidradenitis suppurativa 02/24/2017  . 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 Morbid obesity ([place]) 06/29/2013  . GERD (gastroesophageal reflux disease) 04/25/2013    Roderic Palau, OT student 01/13/2018, 8:17 AM  [place], [place], [address] Phone: [phone]   Fax:  [phone]  Name: Mary Davenport MRN: [id number] Date of Birth: 25-Jul-1976

## 2018-01-13 DIAGNOSIS — S29012A Strain of muscle and tendon of back wall of thorax, initial encounter: Secondary | ICD-10-CM | POA: Diagnosis not present

## 2018-01-17 ENCOUNTER — Encounter (HOSPITAL_COMMUNITY): Payer: Self-pay | Admitting: Occupational Therapy

## 2018-01-17 ENCOUNTER — Ambulatory Visit (HOSPITAL_COMMUNITY): Payer: 59 | Admitting: Occupational Therapy

## 2018-01-17 DIAGNOSIS — R29898 Other symptoms and signs involving the musculoskeletal system: Secondary | ICD-10-CM

## 2018-01-17 DIAGNOSIS — M25512 Pain in left shoulder: Secondary | ICD-10-CM | POA: Diagnosis not present

## 2018-01-17 DIAGNOSIS — M25612 Stiffness of left shoulder, not elsewhere classified: Secondary | ICD-10-CM

## 2018-01-17 NOTE — Therapy (Signed)
[place], [place], [address] Phone: [phone]   Fax:  [phone]  Occupational Therapy Reassessment and Treatment  Patient Details  Name: TEYLA SKIDGEL MRN: [id number] Date of Birth: 12-29-1975 Referring Provider: Dr. Vickki Hearing   Encounter Date: 01/17/2018  OT End of Session - 01/17/18 1739    Visit Number  15    Number of Visits  15    Date for OT Re-Evaluation  01/18/18    Authorization Type  [employer]; $25 copay    Authorization Time Period  40 visit limit    Authorization - Visit Number  15    Authorization - Number of Visits  40    OT Start Time  8016    OT Stop Time  1735    OT Time Calculation (min)  45 min    Activity Tolerance  Patient tolerated treatment well    Behavior During Therapy  [facility] for tasks assessed/performed       Past Medical History:  Diagnosis Date  . Allergy    tx. occ. with OTC meds  . Arthritis    feet, back, hips  . Asthma    has been controlled, not rescue inhaler at present  . Carpal tunnel syndrome   . Complication of anesthesia    coming out of anesthesia with D & C-combative,not breathing, when allowed to come out slower, this was not an issue  . Depression   . Endometriosis of ovary    more resolved since hysterectomy  . GERD (gastroesophageal reflux disease)   . Headache(784.0)    hx. migraines, none in 5 yrs  . Heart murmur   . Hiatal hernia   . Hidradenitis suppurativa    followed by [facility]   . MRSA carrier   . Obesity   . Ovarian cyst    left   . Plantar fasciitis 2009  . PONV (postoperative nausea and vomiting)   . Psoriatic arthritis ([place])    followed by Dr. Berna Bue at [facility]     Past Surgical History:  Procedure Laterality Date  . CHOLECYSTECTOMY     laparoscopic  . DIAGNOSTIC LAPAROSCOPY  8/96   lysis/endometriosis (LKS)  . DILATION AND CURETTAGE OF UTERUS    . LAPAROSCOPIC  GASTRIC SLEEVE RESECTION N/A 07/03/2013   Procedure: LAPAROSCOPIC GASTRIC SLEEVE RESECTION;  Surgeon: Pedro Earls, MD;  Location: [facility];  Service: General;  Laterality: N/A;  . LAPAROSCOPIC TOTAL HYSTERECTOMY  07/2010   robotic assisted  . TONSILLECTOMY    . UPPER GI ENDOSCOPY  07/03/2013   Procedure: UPPER GI ENDOSCOPY;  Surgeon: Pedro Earls, MD;  Location: [facility];  Service: General;;    There were no vitals filed for this visit.  Subjective Assessment - 01/17/18 1651    Subjective   S: I went for the deep tissue massage.     Currently in Pain?  No/denies         [facility] OT Assessment - 01/17/18 1650      Assessment   Medical Diagnosis  left adhesive capsulitis      Precautions   Precautions  None      Observation/Other Assessments   Focus on Therapeutic Outcomes (FOTO)   61/100      AROM   Overall AROM Comments  Assessed seated, er/IR abducted    Right/Left Shoulder  Left    Left Shoulder Flexion  142 Degrees 125 previous  Left Shoulder ABduction  180 Degrees 115 previous    Left Shoulder Internal Rotation  50 Degrees 45 previous    Left Shoulder External Rotation  70 Degrees 57 previous      PROM   Overall PROM Comments  Assessed supine, er/IR adducted    PROM Assessment Site  Shoulder    Right/Left Shoulder  Left    Left Shoulder Flexion  152 Degrees 148 previous    Left Shoulder ABduction  180 Degrees same as previous    Left Shoulder Internal Rotation  90 Degrees same as previous    Left Shoulder External Rotation  45 Degrees 55 previous      Strength   Overall Strength Comments  Assessed seated, er/IR adducted    Strength Assessment Site  Shoulder    Right/Left Shoulder  Left    Left Shoulder Flexion  4+/5 same as previous    Left Shoulder ABduction  4/5 same as previous    Left Shoulder Internal Rotation  4+/5 4/5 previous    Left Shoulder External Rotation  4/5 same as previous               OT Treatments/Exercises (OP) - 01/17/18  1658      Exercises   Exercises  Shoulder      Shoulder Exercises: Standing   Horizontal ABduction  Strengthening;Theraband;15 reps    Theraband Level (Shoulder Horizontal ABduction)  Level 3 (Green)    Extension  [employer] reps    Theraband Level (Shoulder Extension)  Level 2 (Red)    Row  Theraband;12 reps    Theraband Level (Shoulder Row)  Level 2 (Red)    Retraction  Theraband;12 reps    Theraband Level (Shoulder Retraction)  Level 2 (Red)    Diagonals  Strengthening;Theraband;15 reps    Theraband Level (Shoulder Diagonals)  Level 3 (Green)      Shoulder Exercises: Stretch   Internal Rotation Stretch  2 reps 30" each    External Rotation Stretch  2 reps;30 seconds    Wall Stretch - Flexion  2 reps;30 seconds    Other Shoulder Stretches  sleeper stretch for internal rotation agains the wall; 2x20"               OT Short Term Goals - 01/17/18 1740      OT SHORT TERM GOAL #1   Title  Pt will be provided with and educated on HEP to improve mobility required for functional task completion.     Time  4    Period  Weeks    Status  Achieved      OT SHORT TERM GOAL #2   Title  Pt will decrease pain in LUE to 3/10 or less to improve ability to sleep.     Baseline  4/29: Patient has been averaging a 5/10.    Time  4    Period  Weeks    Status  Achieved      OT SHORT TERM GOAL #3   Title  Pt will decrease LUE fascial restrictions to min amounts or less to improve ability to perform overhead reaching tasks.     Time  4    Period  Weeks    Status  Achieved      OT SHORT TERM GOAL #4   Title  Pt will improve LUE A/ROM to WNL to improve ability to donn and doff bra.     Time  4    Period  Weeks  Status  Partially Met      OT SHORT TERM GOAL #5   Title  Pt will improve LUE strength to 5/5 to increase ability to lift weighted objects or complete sustained overhead tasks.     Time  4    Period  Weeks    Status  Partially Met               Plan -  01/17/18 1739    Clinical Impression Statement  A: Reassessment completed this session, pt has met 3/5 STGs and has partially met remaining 2 goals. Pt is lacking ROM required for functional task completion, abduction is WNL however flexion, IR, and er continue to be limited. During passive stretching end range feel is firm and ligamentous, with OT unable to push LUE into functional range. Pt did have deep tissue massage yesterday and OT notes improvement in posture and scapular mobility. Discussed progress with pt and instructed pt to discuss possible manipulation with MD at upcoming appt. Reviewed HEP exercises this session, verbal cuing for form intermittently. Pt is agreeable to hold therapy until MD appt and will call to let rehab know next steps.     Plan  P: Hold therapy until after MD appt.        Patient will benefit from skilled therapeutic intervention in order to improve the following deficits and impairments:  Decreased activity tolerance, Decreased strength, Decreased range of motion, Pain, Increased fascial restrictions, Impaired UE functional use  Visit Diagnosis: Acute pain of left shoulder  Stiffness of left shoulder, not elsewhere classified  Other symptoms and signs involving the musculoskeletal system    Problem List Patient Active Problem List   Diagnosis Date Noted  . Hidradenitis suppurativa 02/24/2017  . 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 Morbid obesity ([place]) 06/29/2013  . GERD (gastroesophageal reflux disease) 04/25/2013   Guadelupe Sabin, OTR/L  [phone] 01/17/2018, 5:46 PM  [place], [place], [address] Phone: [phone]   Fax:  [phone]  Name: MUSKAAN SMET MRN: [id number] Date of Birth: 02/08/1976

## 2018-01-19 ENCOUNTER — Emergency Department (HOSPITAL_COMMUNITY)
Admission: EM | Admit: 2018-01-19 | Discharge: 2018-01-19 | Disposition: A | Discharge status: Home / Self Care | Payer: 59 | Attending: Emergency Medicine | Admitting: Emergency Medicine

## 2018-01-19 ENCOUNTER — Encounter (HOSPITAL_COMMUNITY): Payer: 59

## 2018-01-19 ENCOUNTER — Encounter (HOSPITAL_COMMUNITY): Payer: Self-pay

## 2018-01-19 ENCOUNTER — Emergency Department (HOSPITAL_COMMUNITY): Payer: 59

## 2018-01-19 DIAGNOSIS — Y999 Unspecified external cause status: Secondary | ICD-10-CM | POA: Diagnosis not present

## 2018-01-19 DIAGNOSIS — Y929 Unspecified place or not applicable: Secondary | ICD-10-CM | POA: Diagnosis not present

## 2018-01-19 DIAGNOSIS — J45909 Unspecified asthma, uncomplicated: Secondary | ICD-10-CM | POA: Insufficient documentation

## 2018-01-19 DIAGNOSIS — X58XXXA Exposure to other specified factors, initial encounter: Secondary | ICD-10-CM | POA: Insufficient documentation

## 2018-01-19 DIAGNOSIS — Y9389 Activity, other specified: Secondary | ICD-10-CM | POA: Diagnosis not present

## 2018-01-19 DIAGNOSIS — S2232XA Fracture of one rib, left side, initial encounter for closed fracture: Secondary | ICD-10-CM | POA: Diagnosis not present

## 2018-01-19 DIAGNOSIS — Z79899 Other long term (current) drug therapy: Secondary | ICD-10-CM | POA: Diagnosis not present

## 2018-01-19 DIAGNOSIS — S299XXA Unspecified injury of thorax, initial encounter: Secondary | ICD-10-CM | POA: Diagnosis present

## 2018-01-19 MED ORDER — HYDROCODONE-ACETAMINOPHEN 5-325 MG PO TABS: 1.0000 | ORAL_TABLET | Freq: Four times a day (QID) | ORAL | 0 refills | Status: DC | PRN

## 2018-01-19 NOTE — ED Notes (Signed)
Respiratory called and informed of order for incentive spirometry.

## 2018-01-19 NOTE — ED Triage Notes (Signed)
I was in physical therapy for my left frozen shoulder and there was a pop.  I finished therapy, and now a week later I am worse, having difficulty taking a deep breath.  I have pain in my left ribs and in my sternum.  Now I am becoming hoarse per pt.

## 2018-01-19 NOTE — ED Provider Notes (Signed)
[facility] EMERGENCY DEPARTMENT Provider Note   CSN: [id number] Arrival date & time: 01/19/18  1547     History   Chief Complaint Chief Complaint  Patient presents with  . Rib Injury    HPI Mary Davenport is a 42 y.o. female.  Patient presents to ED with complaint of left sided lateral chest wall and low sternal pain onset one week ago after a physical therapy session. She has been trying cold and heat therapy without improvement. She states she is unable to take a deep breath due to the pain.  The history is provided by the patient. No language interpreter was used.  Chest Pain   This is a new problem. The current episode started more than 1 week ago. The problem occurs constantly. The problem has not changed since onset.The pain is associated with exertion, movement, coughing and breathing. The pain is present in the substernal region and lateral region. The pain is moderate. The quality of the pain is described as sharp and stabbing. The pain does not radiate. The symptoms are aggravated by deep breathing. She has tried rest for the symptoms.    Past Medical History:  Diagnosis Date  . Allergy    tx. occ. with OTC meds  . Arthritis    feet, back, hips  . Asthma    has been controlled, not rescue inhaler at present  . Carpal tunnel syndrome   . Complication of anesthesia    coming out of anesthesia with D & C-combative,not breathing, when allowed to come out slower, this was not an issue  . Depression   . Endometriosis of ovary    more resolved since hysterectomy  . GERD (gastroesophageal reflux disease)   . Headache(784.0)    hx. migraines, none in 5 yrs  . Heart murmur   . Hiatal hernia   . Hidradenitis suppurativa    followed by [facility]   . MRSA carrier   . Obesity   . Ovarian cyst    left   . Plantar fasciitis 2009  . PONV (postoperative nausea and vomiting)   . Psoriatic arthritis ([place])    followed by Dr. Berna Bue at [facility]     Patient Active Problem List   Diagnosis Date Noted  . Hidradenitis suppurativa 02/24/2017  . 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 Morbid obesity ([place]) 06/29/2013  . GERD (gastroesophageal reflux disease) 04/25/2013    Past Surgical History:  Procedure Laterality Date  . CHOLECYSTECTOMY     laparoscopic  . DIAGNOSTIC LAPAROSCOPY  8/96   lysis/endometriosis (LKS)  . DILATION AND CURETTAGE OF UTERUS    . LAPAROSCOPIC GASTRIC SLEEVE RESECTION N/A 07/03/2013   Procedure: LAPAROSCOPIC GASTRIC SLEEVE RESECTION;  Surgeon: Pedro Earls, MD;  [facility];  Service: General;  Laterality: N/A;  . LAPAROSCOPIC TOTAL HYSTERECTOMY  07/2010   robotic assisted  . TONSILLECTOMY    . UPPER GI ENDOSCOPY  07/03/2013   Procedure: UPPER GI ENDOSCOPY;  Surgeon: Pedro Earls, MD;  [facility];  Service: General;;     OB History    Gravida  3   Para  2   Term      Preterm      AB      Living  2     SAB      TAB      Ectopic      Multiple  Live Births               Home Medications    Prior to Admission medications   Medication Sig Start Date End Date Taking? Authorizing Provider  cetirizine (ZYRTEC) 10 MG tablet Take 10 mg by mouth daily.    [provider]  HUMIRA PEN 40 MG/0.8ML PNKT  02/11/17   [provider]  Pediatric Multivit-Minerals-C (FLINTSTONES COMPLETE PO) Take 1 tablet by mouth at bedtime.    [provider]  Vitamin D, Ergocalciferol, (DRISDOL) 50000 units CAPS capsule Take 1 capsule (50,000 Units total) by mouth every 7 (seven) days. 10/25/16   Megan Salon, MD    Family History Family History  Problem Relation Age of Onset  . Heart disease Mother   . Hyperlipidemia Mother   . Hypertension Mother   . Diabetes Maternal Grandmother   . Hypertension Maternal Grandmother   . Heart disease Maternal Grandmother   . Breast cancer  Other        maternal great aunt  . Heart disease Maternal Grandfather     Social History Social History   Tobacco Use  . Smoking status: Never Smoker  . Smokeless tobacco: Never Used  Substance Use Topics  . Alcohol use: No    Alcohol/week: 0.0 oz  . Drug use: No     Allergies   Iodinated diagnostic agents; Lactose intolerance (gi); Shellfish allergy; Sulfa antibiotics; Whey protein [protein]; Erythromycin; and Sudafed [pseudoephedrine]   Review of Systems Review of Systems  Cardiovascular: Positive for chest pain.  All other systems reviewed and are negative.    Physical Exam Updated Vital Signs BP (!) 129/93 (BP Location: Right Arm)   Pulse 95   Temp 98 F (36.7 C) (Oral)   Resp 16   Ht 5' 5"  (1.651 m)   Wt 86.2 kg (190 lb)   SpO2 97%   BMI 31.62 kg/m   Physical Exam  Constitutional: She is oriented to person, place, and time. She appears well-developed and well-nourished.  HENT:  Head: Normocephalic.  Eyes: Conjunctivae are normal.  Neck: Normal range of motion.  Cardiovascular: Normal rate and regular rhythm.  Pulmonary/Chest: Effort normal and breath sounds normal. She exhibits tenderness.  Abdominal: Soft.  Musculoskeletal: She exhibits no edema.  Neurological: She is alert and oriented to person, place, and time.  Skin: Skin is warm and dry.  Psychiatric: She has a normal mood and affect.  Nursing note and vitals reviewed.    ED Treatments / Results  Labs (all labs ordered are listed, but only abnormal results are displayed) Labs Reviewed - No data to display  EKG None  Radiology Dg Ribs Unilateral W/chest Left  Result Date: 01/19/2018 CLINICAL DATA:  Chest pain following physical therapy, initial encounter EXAM: LEFT RIBS AND CHEST - 3+ VIEW COMPARISON:  None. FINDINGS: Cardiac shadow is within normal limits. The lungs are well aerated bilaterally. No acute infiltrate or sizable effusion is seen. The rib cage shows an undisplaced fracture  of the left tenth rib anteriorly. This would correspond with the patient's given clinical symptomatology. Postsurgical changes in the left upper abdomen are noted. No pneumothorax is seen. IMPRESSION: Left tenth rib fracture without complicating factors. Electronically Signed   By: Inez Catalina M.D.   On: 01/19/2018 16:28    Procedures Procedures (including critical care time)  Medications Ordered in ED Medications - No data to display   Initial Impression / Assessment and Plan / ED Course  I have reviewed  the triage vital signs and the nursing notes.  Pertinent labs & imaging results that were available during my care of the patient were reviewed by me and considered in my medical decision making (see chart for details).    [place] Narcotic database consulted when formulating treatment plan.     Patient with non-displaced fracture of the left tenth rib. No evidence of infiltrate.  Will treat with pain medication, incentive spirometry, deep breathing exercise. Return precautions discussed. Patient appears safe for discharge at this time.  Final Clinical Impressions(s) / ED Diagnoses   Final diagnoses:  Closed fracture of one rib of left side, initial encounter    ED Discharge Orders        Ordered    HYDROcodone-acetaminophen (NORCO/VICODIN) 5-325 MG tablet  Every 6 hours PRN     01/19/18 1653       Etta Quill, NP 64/68/03 2122    Delora Fuel, MD 48/25/00 [phone]

## 2018-01-24 DIAGNOSIS — M25512 Pain in left shoulder: Secondary | ICD-10-CM | POA: Diagnosis not present

## 2018-01-24 DIAGNOSIS — M7502 Adhesive capsulitis of left shoulder: Secondary | ICD-10-CM | POA: Diagnosis not present

## 2018-01-25 ENCOUNTER — Encounter (HOSPITAL_COMMUNITY): Payer: Self-pay | Admitting: Occupational Therapy

## 2018-01-25 NOTE — Therapy (Signed)
[place], [place], [address] Phone: [phone]   Fax:  [phone]  Patient Details  Name: Mary Davenport MRN: [id number] Date of Birth: 24-Aug-1975 Referring Provider:  No ref. provider found  Encounter Date: 01/25/2018   OCCUPATIONAL THERAPY DISCHARGE SUMMARY  Visits from Start of Care: 15  Current functional level related to goals / functional outcomes: Follow up from pt being placed on hold after reassessment: Spoke with pt regarding MD appt on 6/4. Pt reports she will be having MRI on 6/10 to further confirm frozen shoulder, if confirmed she will then be referred to orthopedic surgeon to discuss a potential manipulation. In the meantime she has been instructed to continue gentle home exercises to maintain current ROM of LUE.  Pt is aware of need for referral and immediate therapy if she does have a manipulation.    From reassessment on 5/28:  Pt has met 3/5 STGs and has partially met remaining 2 goals. Pt is lacking ROM  required for functional task completion, abduction is WNL however flexion, IR, and er continue to be limited. During  passive stretching end range feel is firm and ligamentous, with OT unable to manually stretch LUE into functional  range.    Remaining deficits: Pain, fascial restrictions, ROM and strength deficits.    Education / Equipment: HEP for shoulder ROM and functional use Plan: Patient agrees to discharge.  Patient goals were partially met. Patient is being discharged due to lack of progress.  ?????         Guadelupe Sabin, OTR/L  [phone] 01/25/2018, 5:04 PM  [place] [address] [place], [place], [address] Phone: [phone]   Fax:  [phone]

## 2018-01-30 DIAGNOSIS — M7502 Adhesive capsulitis of left shoulder: Secondary | ICD-10-CM | POA: Diagnosis not present

## 2018-02-03 DIAGNOSIS — M25512 Pain in left shoulder: Secondary | ICD-10-CM | POA: Diagnosis not present

## 2018-02-03 DIAGNOSIS — M7502 Adhesive capsulitis of left shoulder: Secondary | ICD-10-CM | POA: Diagnosis not present

## 2018-02-14 ENCOUNTER — Encounter (HOSPITAL_COMMUNITY): Payer: Self-pay | Admitting: Occupational Therapy

## 2018-02-14 DIAGNOSIS — H8101 Meniere's disease, right ear: Secondary | ICD-10-CM | POA: Diagnosis not present

## 2018-02-14 DIAGNOSIS — H9041 Sensorineural hearing loss, unilateral, right ear, with unrestricted hearing on the contralateral side: Secondary | ICD-10-CM | POA: Diagnosis not present

## 2018-02-27 DIAGNOSIS — M25512 Pain in left shoulder: Secondary | ICD-10-CM | POA: Diagnosis not present

## 2018-02-27 DIAGNOSIS — M7502 Adhesive capsulitis of left shoulder: Secondary | ICD-10-CM | POA: Diagnosis not present

## 2018-03-06 DIAGNOSIS — M7502 Adhesive capsulitis of left shoulder: Secondary | ICD-10-CM | POA: Diagnosis not present

## 2018-03-06 DIAGNOSIS — M25512 Pain in left shoulder: Secondary | ICD-10-CM | POA: Diagnosis not present

## 2018-03-23 DIAGNOSIS — L409 Psoriasis, unspecified: Secondary | ICD-10-CM | POA: Diagnosis not present

## 2018-03-23 DIAGNOSIS — L732 Hidradenitis suppurativa: Secondary | ICD-10-CM | POA: Diagnosis not present

## 2018-03-23 DIAGNOSIS — L4050 Arthropathic psoriasis, unspecified: Secondary | ICD-10-CM | POA: Diagnosis not present

## 2018-04-04 DIAGNOSIS — G8918 Other acute postprocedural pain: Secondary | ICD-10-CM | POA: Diagnosis not present

## 2018-04-04 DIAGNOSIS — S43432A Superior glenoid labrum lesion of left shoulder, initial encounter: Secondary | ICD-10-CM | POA: Diagnosis not present

## 2018-04-04 DIAGNOSIS — M25512 Pain in left shoulder: Secondary | ICD-10-CM | POA: Diagnosis not present

## 2018-04-04 DIAGNOSIS — M19012 Primary osteoarthritis, left shoulder: Secondary | ICD-10-CM | POA: Diagnosis not present

## 2018-04-04 DIAGNOSIS — R600 Localized edema: Secondary | ICD-10-CM | POA: Diagnosis not present

## 2018-04-04 DIAGNOSIS — M7542 Impingement syndrome of left shoulder: Secondary | ICD-10-CM | POA: Diagnosis not present

## 2018-04-04 DIAGNOSIS — M7502 Adhesive capsulitis of left shoulder: Secondary | ICD-10-CM | POA: Diagnosis not present

## 2018-04-04 HISTORY — PX: SHOULDER SURGERY: SHX246

## 2018-04-05 DIAGNOSIS — M7502 Adhesive capsulitis of left shoulder: Secondary | ICD-10-CM | POA: Diagnosis not present

## 2018-04-06 DIAGNOSIS — M7502 Adhesive capsulitis of left shoulder: Secondary | ICD-10-CM | POA: Diagnosis not present

## 2018-04-06 DIAGNOSIS — M25512 Pain in left shoulder: Secondary | ICD-10-CM | POA: Diagnosis not present

## 2018-04-07 DIAGNOSIS — M25512 Pain in left shoulder: Secondary | ICD-10-CM | POA: Diagnosis not present

## 2018-04-10 DIAGNOSIS — M7502 Adhesive capsulitis of left shoulder: Secondary | ICD-10-CM | POA: Diagnosis not present

## 2018-04-11 DIAGNOSIS — M25512 Pain in left shoulder: Secondary | ICD-10-CM | POA: Diagnosis not present

## 2018-04-12 DIAGNOSIS — M7502 Adhesive capsulitis of left shoulder: Secondary | ICD-10-CM | POA: Diagnosis not present

## 2018-04-12 DIAGNOSIS — M25512 Pain in left shoulder: Secondary | ICD-10-CM | POA: Diagnosis not present

## 2018-04-13 DIAGNOSIS — M25512 Pain in left shoulder: Secondary | ICD-10-CM | POA: Diagnosis not present

## 2018-04-14 DIAGNOSIS — M25512 Pain in left shoulder: Secondary | ICD-10-CM | POA: Diagnosis not present

## 2018-04-17 DIAGNOSIS — M25512 Pain in left shoulder: Secondary | ICD-10-CM | POA: Diagnosis not present

## 2018-04-19 DIAGNOSIS — M25512 Pain in left shoulder: Secondary | ICD-10-CM | POA: Diagnosis not present

## 2018-04-20 DIAGNOSIS — M25512 Pain in left shoulder: Secondary | ICD-10-CM | POA: Diagnosis not present

## 2018-04-25 DIAGNOSIS — M25512 Pain in left shoulder: Secondary | ICD-10-CM | POA: Diagnosis not present

## 2018-04-27 DIAGNOSIS — M25512 Pain in left shoulder: Secondary | ICD-10-CM | POA: Diagnosis not present

## 2018-05-01 DIAGNOSIS — M7502 Adhesive capsulitis of left shoulder: Secondary | ICD-10-CM | POA: Diagnosis not present

## 2018-05-04 DIAGNOSIS — M25512 Pain in left shoulder: Secondary | ICD-10-CM | POA: Diagnosis not present

## 2018-05-08 DIAGNOSIS — M7502 Adhesive capsulitis of left shoulder: Secondary | ICD-10-CM | POA: Diagnosis not present

## 2018-05-11 DIAGNOSIS — M7502 Adhesive capsulitis of left shoulder: Secondary | ICD-10-CM | POA: Diagnosis not present

## 2018-05-12 ENCOUNTER — Encounter

## 2018-05-12 ENCOUNTER — Other Ambulatory Visit: Payer: Self-pay

## 2018-05-12 ENCOUNTER — Encounter: Payer: Self-pay | Admitting: Obstetrics & Gynecology

## 2018-05-12 ENCOUNTER — Ambulatory Visit (INDEPENDENT_AMBULATORY_CARE_PROVIDER_SITE_OTHER): Payer: 59 | Admitting: Obstetrics & Gynecology

## 2018-05-12 VITALS — BP 106/70 | HR 72 | Resp 16 | Ht 64.0 in | Wt 191.6 lb

## 2018-05-12 DIAGNOSIS — E559 Vitamin D deficiency, unspecified: Secondary | ICD-10-CM | POA: Diagnosis not present

## 2018-05-12 DIAGNOSIS — Z01419 Encounter for gynecological examination (general) (routine) without abnormal findings: Secondary | ICD-10-CM

## 2018-05-12 NOTE — Progress Notes (Signed)
42 y.o. G3P2 Married White or Caucasian female here for annual exam.  Had frozen shoulder issues since the winter.  Started physical therapy and while doing PT, the therapy fractured her left 10th rib.  Pt has pain for several days.  Reports she went to a doc in the box and had a chest Xray.  This was negative.  Ended up a week later at the ER at [facility].  Had to stop physical therapy and this worsened the frozen shoulder.  Ended up having to have surgery for the frozen shoulder.    Rheumatologist:  Gavin Pound.  [facility].  No LMP recorded. Patient has had a hysterectomy.          Sexually active: Yes.    The current method of family planning is status post hysterectomy.    Exercising: Yes.    Yoga, pure [place], walk Smoker:  no  Health Maintenance: Pap:  02/24/17 Neg  History of abnormal Pap:  no MMG:  01/30/14 BIRADS1:neg  TDaP:  2014 Screening Labs: Vit D   reports that she has never smoked. She has never used smokeless tobacco. She reports that she does not drink alcohol or use drugs.  Past Medical History:  Diagnosis Date  . Allergy    tx. occ. with OTC meds  . Arthritis    feet, back, hips  . Asthma    has been controlled, not rescue inhaler at present  . Carpal tunnel syndrome   . Complication of anesthesia    coming out of anesthesia with D & C-combative,not breathing, when allowed to come out slower, this was not an issue  . Depression   . Endometriosis of ovary    more resolved since hysterectomy  . GERD (gastroesophageal reflux disease)   . Headache(784.0)    hx. migraines, none in 5 yrs  . Heart murmur   . Hiatal hernia   . Hidradenitis suppurativa    followed by [facility]   . MRSA carrier   . Obesity   . Ovarian cyst    left   . Plantar fasciitis 2009  . PONV (postoperative nausea and vomiting)   . Psoriatic arthritis ([place])    followed by Dr. Berna Bue at [facility]     Past Surgical  History:  Procedure Laterality Date  . CHOLECYSTECTOMY     laparoscopic  . DIAGNOSTIC LAPAROSCOPY  8/96   lysis/endometriosis (LKS)  . DILATION AND CURETTAGE OF UTERUS    . LAPAROSCOPIC GASTRIC SLEEVE RESECTION N/A 07/03/2013   Procedure: LAPAROSCOPIC GASTRIC SLEEVE RESECTION;  Surgeon: Pedro Earls, MD;  [facility];  Service: General;  Laterality: N/A;  . LAPAROSCOPIC TOTAL HYSTERECTOMY  07/2010   robotic assisted  . SHOULDER SURGERY Left 04/04/2018   scar tissue   . TONSILLECTOMY    . UPPER GI ENDOSCOPY  07/03/2013   Procedure: UPPER GI ENDOSCOPY;  Surgeon: Pedro Earls, MD;  [facility];  Service: General;;    Current Outpatient Medications  Medication Sig Dispense Refill  . CALCIUM-MAGNESIUM-ZINC PO Take by mouth daily.    . cetirizine (ZYRTEC) 10 MG tablet Take 10 mg by mouth daily.    . chlorthalidone (HYGROTON) 50 MG tablet Take 1 tablet by mouth daily.    Marland Kitchen HUMIRA PEN 40 MG/0.8ML PNKT     . Pediatric Multivit-Minerals-C (FLINTSTONES COMPLETE PO) Take 1 tablet by mouth at bedtime.     No current facility-administered medications for this visit.  Family History  Problem Relation Age of Onset  . Heart disease Mother   . Hyperlipidemia Mother   . Hypertension Mother   . Diabetes Maternal Grandmother   . Hypertension Maternal Grandmother   . Heart disease Maternal Grandmother   . Breast cancer Other        maternal great aunt  . Heart disease Maternal Grandfather     Review of Systems  Musculoskeletal: Positive for myalgias.  All other systems reviewed and are negative.   Exam:   BP 106/70 (BP Location: Right Arm, Patient Position: Sitting, Cuff Size: Large)   Pulse 72   Resp 16   Ht 5' 4"  (1.626 m)   Wt 191 lb 9.6 oz (86.9 kg)   BMI 32.89 kg/m    Height: 5' 4"  (162.6 cm)  Ht Readings from Last 3 Encounters:  05/12/18 5' 4"  (1.626 m)  01/19/18 5' 5"  (1.651 m)  02/24/17 5' 3.5" (1.613 m)    General appearance: alert, cooperative and  appears stated age Head: Normocephalic, without obvious abnormality, atraumatic Neck: no adenopathy, supple, symmetrical, trachea midline and thyroid normal to inspection and palpation Lungs: clear to auscultation bilaterally Breasts: normal appearance, no masses or tenderness Heart: regular rate and rhythm Abdomen: soft, non-tender; bowel sounds normal; no masses,  no organomegaly Extremities: extremities normal, atraumatic, no cyanosis or edema Skin: Skin color, texture, turgor normal. No rashes or lesions Lymph nodes: Cervical, supraclavicular, and axillary nodes normal. No abnormal inguinal nodes palpated Neurologic: Grossly normal   Pelvic: External genitalia:  no lesions              Urethra:  normal appearing urethra with no masses, tenderness or lesions              Bartholins and Skenes: normal                 Vagina: normal appearing vagina with normal color and discharge, no lesions              Cervix: absent              Pap taken: No. Bimanual Exam:  Uterus:  uterus absent              Adnexa: normal adnexa and no mass, fullness, tenderness               Rectovaginal: Confirms               Anus:  normal sphincter tone, no lesions  Chaperone was present for exam.  A:  Well Woman with normal exam H/O robotic [facility] 12/11 Frozen shoulder surgery 04/04/18 Psoriatic arthritis, followed by Dr. Trudie Reed H/O hidradenitis   P:   Mammogram guidelines reviewed pap smear not indicated.  Neg 2018. Vit D will be obtained today. return annually or prn

## 2018-05-13 LAB — VITAMIN D 25 HYDROXY (VIT D DEFICIENCY, FRACTURES): Vit D, 25-Hydroxy: 23 ng/mL — ABNORMAL LOW (ref 30.0–100.0)

## 2018-05-15 DIAGNOSIS — M25512 Pain in left shoulder: Secondary | ICD-10-CM | POA: Diagnosis not present

## 2018-05-18 DIAGNOSIS — M25512 Pain in left shoulder: Secondary | ICD-10-CM | POA: Diagnosis not present

## 2018-05-22 DIAGNOSIS — M25512 Pain in left shoulder: Secondary | ICD-10-CM | POA: Diagnosis not present

## 2018-05-25 DIAGNOSIS — M25512 Pain in left shoulder: Secondary | ICD-10-CM | POA: Diagnosis not present

## 2018-05-30 DIAGNOSIS — M25512 Pain in left shoulder: Secondary | ICD-10-CM | POA: Diagnosis not present

## 2018-06-01 DIAGNOSIS — M25512 Pain in left shoulder: Secondary | ICD-10-CM | POA: Diagnosis not present

## 2018-06-09 DIAGNOSIS — Z23 Encounter for immunization: Secondary | ICD-10-CM | POA: Diagnosis not present

## 2018-06-09 DIAGNOSIS — M7502 Adhesive capsulitis of left shoulder: Secondary | ICD-10-CM | POA: Diagnosis not present

## 2018-06-13 ENCOUNTER — Other Ambulatory Visit: Payer: Self-pay | Admitting: Obstetrics & Gynecology

## 2018-06-13 DIAGNOSIS — Z1231 Encounter for screening mammogram for malignant neoplasm of breast: Secondary | ICD-10-CM

## 2018-06-15 DIAGNOSIS — M25512 Pain in left shoulder: Secondary | ICD-10-CM | POA: Diagnosis not present

## 2018-06-22 DIAGNOSIS — M25512 Pain in left shoulder: Secondary | ICD-10-CM | POA: Diagnosis not present

## 2018-07-04 DIAGNOSIS — M7502 Adhesive capsulitis of left shoulder: Secondary | ICD-10-CM | POA: Diagnosis not present

## 2018-07-04 DIAGNOSIS — M25512 Pain in left shoulder: Secondary | ICD-10-CM | POA: Diagnosis not present

## 2018-07-28 ENCOUNTER — Ambulatory Visit
Admission: RE | Admit: 2018-07-28 | Discharge: 2018-07-28 | Disposition: A | Discharge status: Home / Self Care | Payer: 59 | Source: Ambulatory Visit | Attending: Obstetrics & Gynecology | Admitting: Obstetrics & Gynecology

## 2018-07-28 DIAGNOSIS — Z1231 Encounter for screening mammogram for malignant neoplasm of breast: Secondary | ICD-10-CM

## 2018-09-28 DIAGNOSIS — M2550 Pain in unspecified joint: Secondary | ICD-10-CM | POA: Diagnosis not present

## 2018-09-28 DIAGNOSIS — L409 Psoriasis, unspecified: Secondary | ICD-10-CM | POA: Diagnosis not present

## 2018-09-28 DIAGNOSIS — L4050 Arthropathic psoriasis, unspecified: Secondary | ICD-10-CM | POA: Diagnosis not present

## 2018-10-26 DIAGNOSIS — H9041 Sensorineural hearing loss, unilateral, right ear, with unrestricted hearing on the contralateral side: Secondary | ICD-10-CM | POA: Diagnosis not present

## 2018-10-26 DIAGNOSIS — H8101 Meniere's disease, right ear: Secondary | ICD-10-CM | POA: Diagnosis not present

## 2019-03-12 IMAGING — MR MR SHOULDER*R* W/O CM
4 of 5 series · 29 of 40 positions shown · non-contrast
Comparison: None.

CLINICAL DATA: Right shoulder pain with right arm weakness
chronically.

EXAM:
MRI OF THE RIGHT SHOULDER WITHOUT CONTRAST
TECHNIQUE: Multiplanar, multisequence MR imaging of the shoulder was performed.
No intravenous contrast was administered.

[Series 3: T2 fat-sat · axial · 4.0mm · 0.25mm/px · z∈[-33,+62]mm · 8 of 22 slices shown (1 of 3)]
[im 1/22]
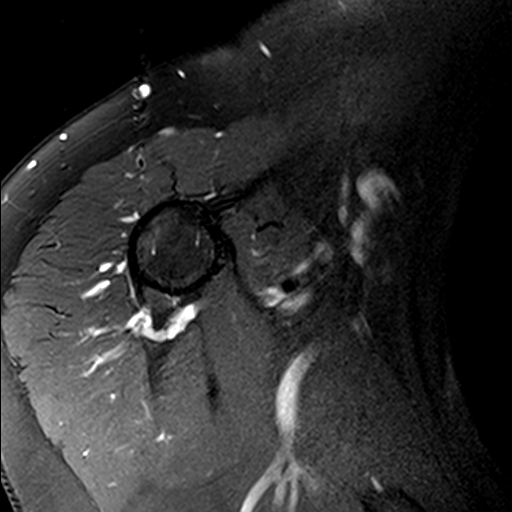
[im 3/22]
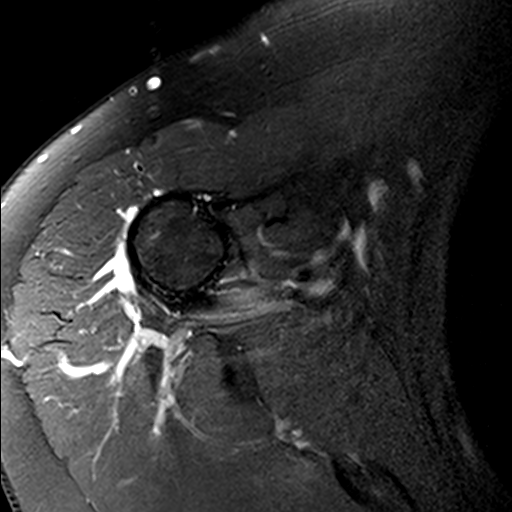
[im 8/22]
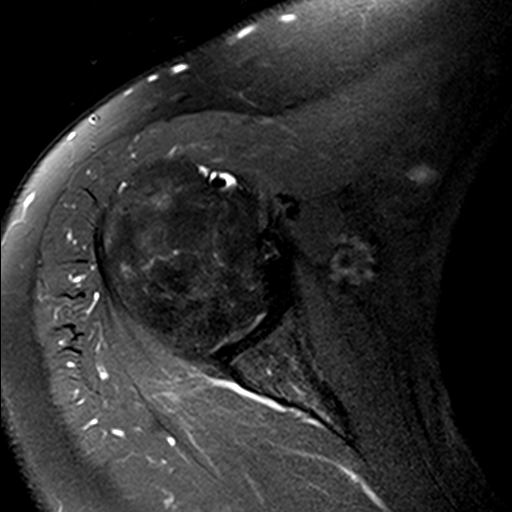
[im 10/22]
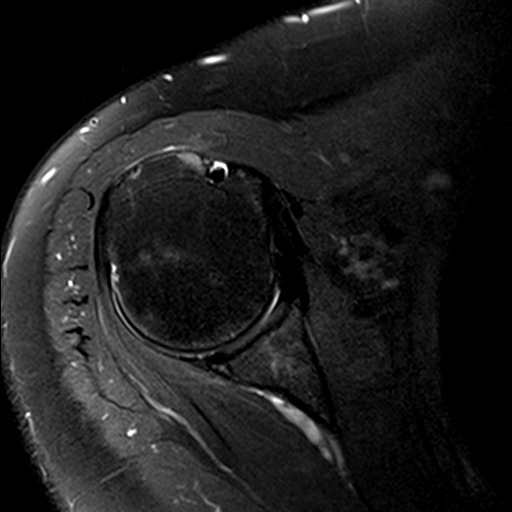
[im 12/22]
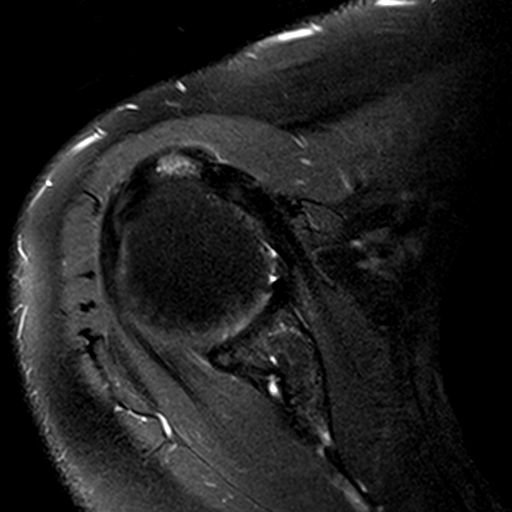
[im 15/22]
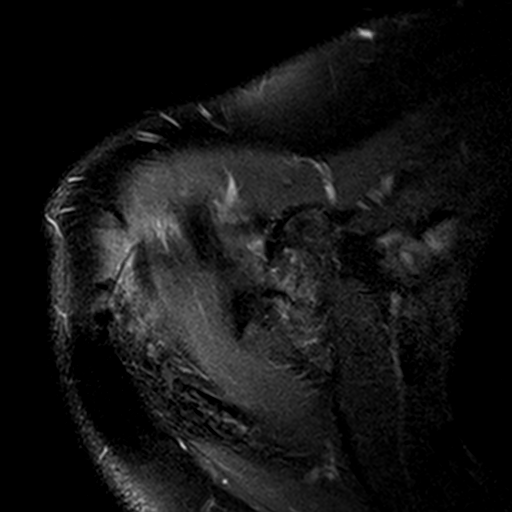
[im 19/22]
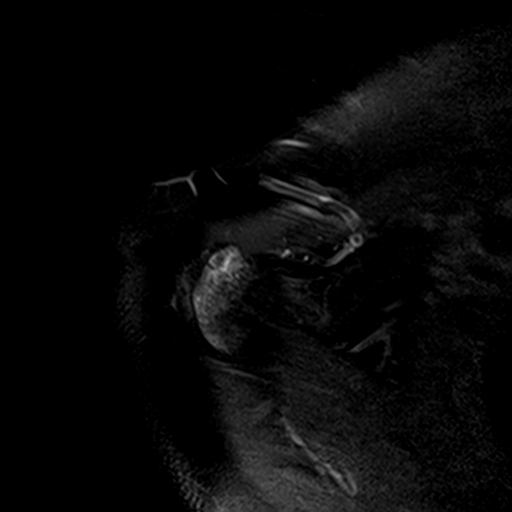
[im 22/22]
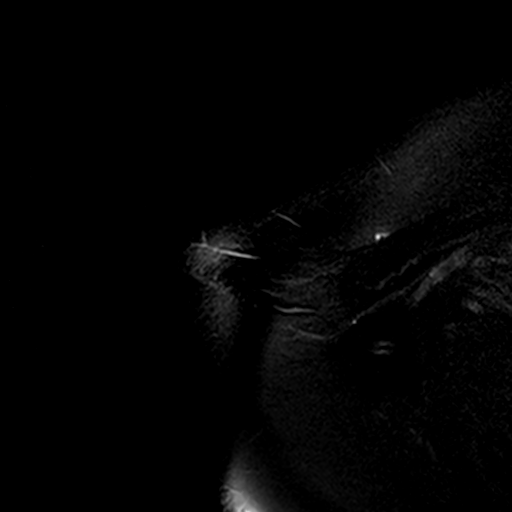

[Series 4: T2 fat-sat · oblique · 4.0mm · 0.55mm/px · 8 of 19 slices shown (2 of 3)]
[im 1/19]
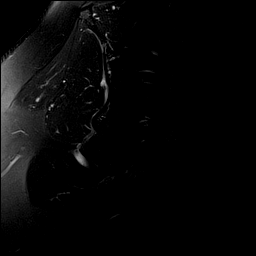
[im 3/19]
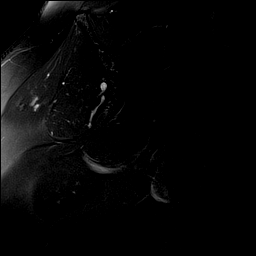
[im 6/19]
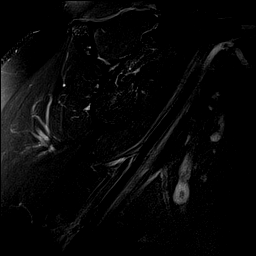
[im 8/19]
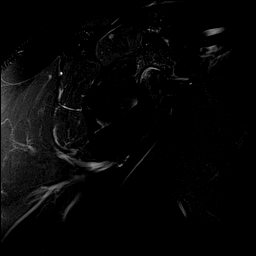
[im 11/19]
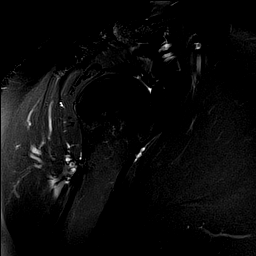
[im 13/19]
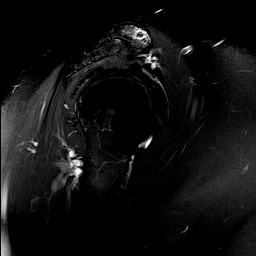
[im 16/19]
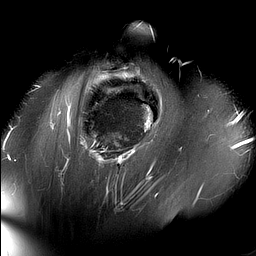
[im 19/19]
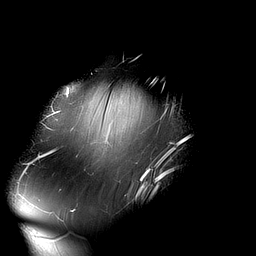

[Series 6: T2 fat-sat · oblique · 4.0mm · 0.55mm/px · 6 of 17 slices shown (3 of 3)]
[im 1/17]
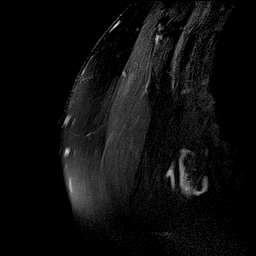
[im 3/17]
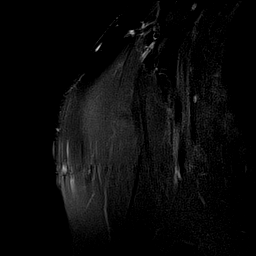
[im 6/17]
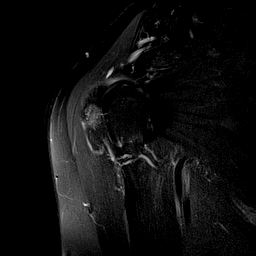
[im 9/17]
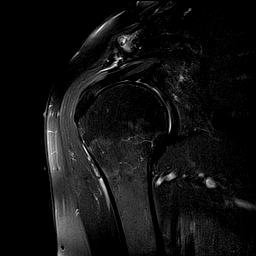
[im 11/17]
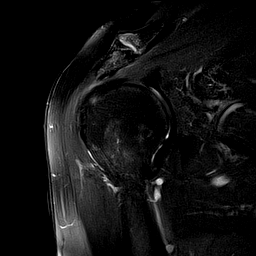
[im 14/17]
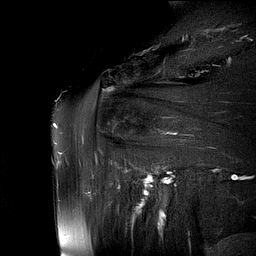

[Series 7: PD · oblique · 4.0mm · 0.27mm/px · 7 of 17 slices shown]
[im 1/17]
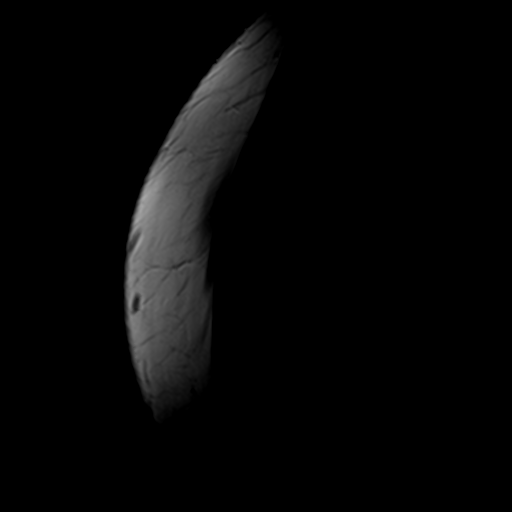
[im 3/17]
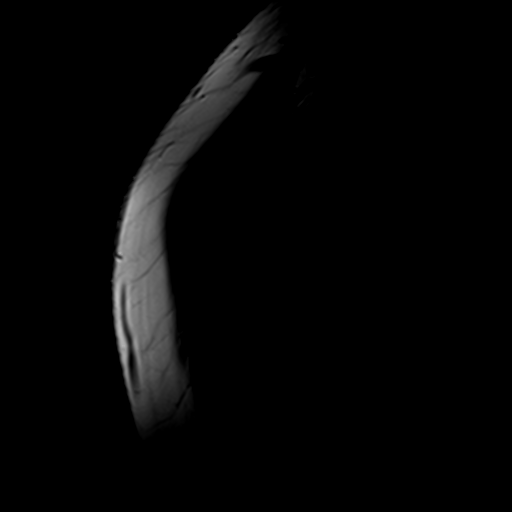
[im 6/17]
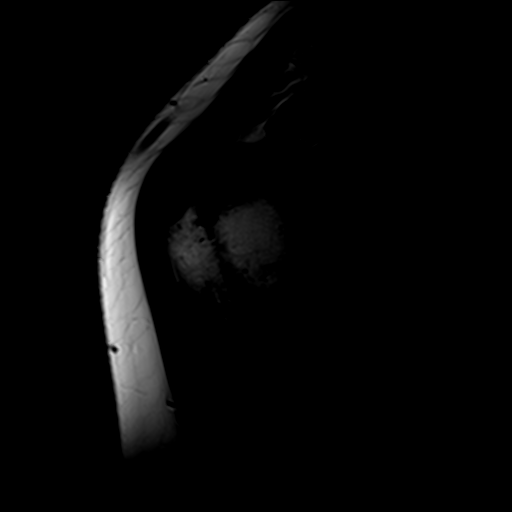
[im 9/17]
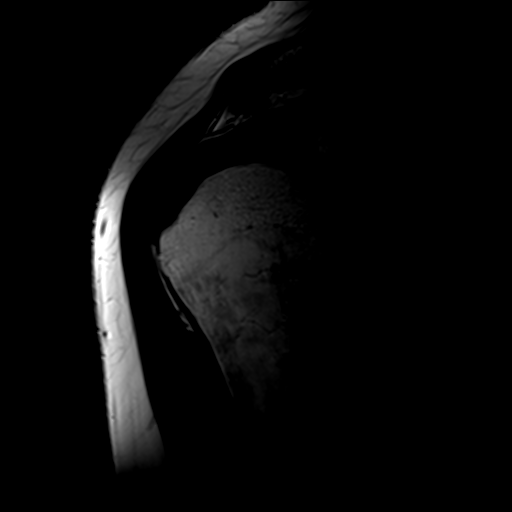
[im 11/17]
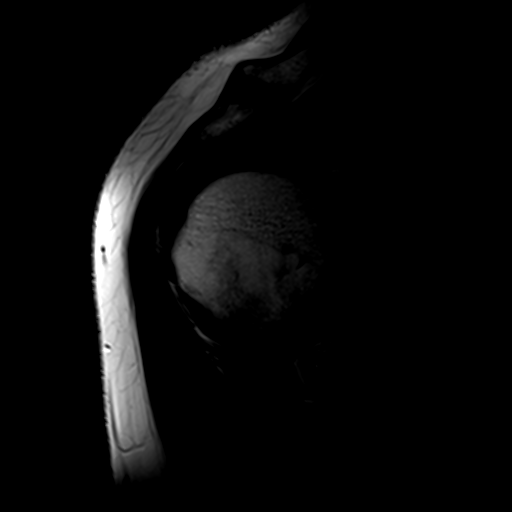
[im 14/17]
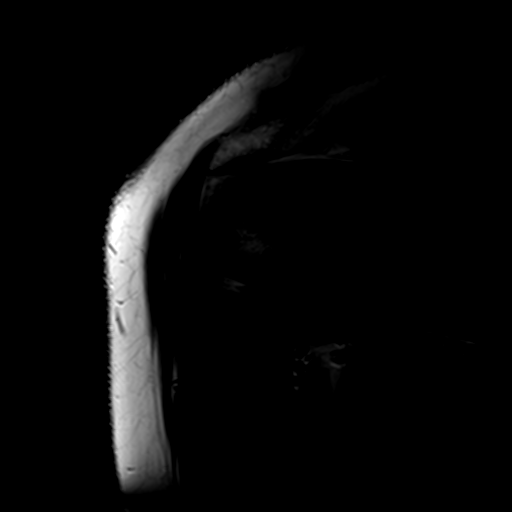
[im 17/17]
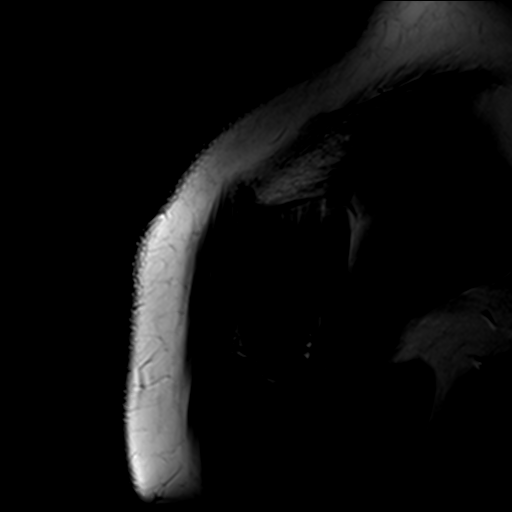

[29 of 40 positions shown; findings below may reference images not displayed]

FINDINGS: Despite efforts by the technologist and patient, motion artifact is
present on today's exam and could not be eliminated. This reduces
exam sensitivity and specificity.

Rotator cuff:  Mild supraspinatus and infraspinatus tendinopathy.

Muscles:  Unremarkable

Biceps long head:  Unremarkable

Acromioclavicular Joint: Mild spurring and mild subcortical marrow
edema. Type III acromion. Trace subacromial subdeltoid bursitis.
Laterally downsloping acromion predisposes to impingement.

Glenohumeral Joint: Mild to moderate degenerative chondral thinning.

Labrum:  Grossly unremarkable

Bones: Low-level edema anteriorly in the greater tuberosity, likely
reactive.

Other: No supplemental non-categorized findings.
IMPRESSION: 1. Mild supraspinatus and infraspinatus tendinopathy.
2. Unfavorable subacromial morphology.
3. Trace subacromial subdeltoid bursitis.
4. Mild to moderate degenerative glenohumeral arthropathy and mild
degenerative AC joint arthropathy.

## 2019-08-28 ENCOUNTER — Other Ambulatory Visit: Payer: Self-pay | Admitting: Obstetrics & Gynecology

## 2019-08-28 DIAGNOSIS — Z1231 Encounter for screening mammogram for malignant neoplasm of breast: Secondary | ICD-10-CM

## 2019-08-29 ENCOUNTER — Ambulatory Visit
Admission: RE | Admit: 2019-08-29 | Discharge: 2019-08-29 | Disposition: A | Discharge status: Home / Self Care | Payer: 59 | Source: Ambulatory Visit

## 2019-08-29 ENCOUNTER — Other Ambulatory Visit: Payer: Self-pay

## 2019-08-29 DIAGNOSIS — Z1231 Encounter for screening mammogram for malignant neoplasm of breast: Secondary | ICD-10-CM

## 2019-08-31 ENCOUNTER — Other Ambulatory Visit: Payer: Self-pay | Admitting: Obstetrics & Gynecology

## 2019-08-31 DIAGNOSIS — R928 Other abnormal and inconclusive findings on diagnostic imaging of breast: Secondary | ICD-10-CM

## 2019-09-05 ENCOUNTER — Ambulatory Visit
Admission: RE | Admit: 2019-09-05 | Discharge: 2019-09-05 | Disposition: A | Discharge status: Home / Self Care | Payer: 59 | Source: Ambulatory Visit | Attending: Obstetrics & Gynecology | Admitting: Obstetrics & Gynecology

## 2019-09-05 ENCOUNTER — Other Ambulatory Visit: Payer: Self-pay

## 2019-09-05 DIAGNOSIS — R928 Other abnormal and inconclusive findings on diagnostic imaging of breast: Secondary | ICD-10-CM

## 2019-09-12 ENCOUNTER — Other Ambulatory Visit: Payer: Self-pay

## 2019-09-14 ENCOUNTER — Other Ambulatory Visit: Payer: Self-pay

## 2019-09-14 ENCOUNTER — Encounter: Payer: Self-pay | Admitting: Obstetrics & Gynecology

## 2019-09-14 ENCOUNTER — Ambulatory Visit (INDEPENDENT_AMBULATORY_CARE_PROVIDER_SITE_OTHER): Payer: 59 | Admitting: Obstetrics & Gynecology

## 2019-09-14 VITALS — BP 108/70 | HR 68 | Temp 97.7°F | Resp 12 | Ht 63.5 in | Wt 202.0 lb

## 2019-09-14 DIAGNOSIS — Z0000 Encounter for general adult medical examination without abnormal findings: Secondary | ICD-10-CM | POA: Diagnosis not present

## 2019-09-14 DIAGNOSIS — E559 Vitamin D deficiency, unspecified: Secondary | ICD-10-CM | POA: Diagnosis not present

## 2019-09-14 DIAGNOSIS — Z01419 Encounter for gynecological examination (general) (routine) without abnormal findings: Secondary | ICD-10-CM | POA: Diagnosis not present

## 2019-09-14 NOTE — Progress Notes (Signed)
44 y.o. G3P2 Married White or Caucasian female here for annual exam.  Doing well.  Got called back from screening MMG that showed a simple cyst.  Routine follow up recommended.    Denies vaginal bleeding.    Shoulders are finally better.  Had frozen shoulder and then rib fracture which slowed her recovery.  Did have to have surgery on the right shoulder.    Rheumatologist:  Dr. Trudie Reed  No LMP recorded. Patient has had a hysterectomy.          Sexually active: Yes.    The current method of family planning is status post hysterectomy.    Exercising: Yes.    3-4 x a week Smoker:  no  Health Maintenance: Pap:  02/24/17 Neg  History of abnormal Pap:  no MMG:  09/05/19 BIRADS 2 benign/density b TDaP:  2014 Screening Labs: discuss if needed   reports that she has never smoked. She has never used smokeless tobacco. She reports that she does not drink alcohol or use drugs.  Past Medical History:  Diagnosis Date  . Allergy    tx. occ. with OTC meds  . Arthritis    feet, back, hips  . Asthma    has been controlled, not rescue inhaler at present  . Carpal tunnel syndrome   . Complication of anesthesia    coming out of anesthesia with D & C-combative,not breathing, when allowed to come out slower, this was not an issue  . Depression   . Endometriosis of ovary    more resolved since hysterectomy  . GERD (gastroesophageal reflux disease)   . Headache(784.0)    hx. migraines, none in 5 yrs  . Heart murmur   . Hiatal hernia   . Hidradenitis suppurativa    followed by [facility]   . MRSA carrier   . Obesity   . Ovarian cyst    left   . Plantar fasciitis 2009  . PONV (postoperative nausea and vomiting)   . Psoriatic arthritis ([place])    followed by Dr. Berna Bue at [facility]     Past Surgical History:  Procedure Laterality Date  . CHOLECYSTECTOMY     laparoscopic  . DIAGNOSTIC LAPAROSCOPY  8/96   lysis/endometriosis (LKS)  .  DILATION AND CURETTAGE OF UTERUS    . LAPAROSCOPIC GASTRIC SLEEVE RESECTION N/A 07/03/2013   Procedure: LAPAROSCOPIC GASTRIC SLEEVE RESECTION;  Surgeon: Pedro Earls, MD;  Location: [facility];  Service: General;  Laterality: N/A;  . LAPAROSCOPIC TOTAL HYSTERECTOMY  07/2010   robotic assisted  . SHOULDER SURGERY Left 04/04/2018   scar tissue   . TONSILLECTOMY    . UPPER GI ENDOSCOPY  07/03/2013   Procedure: UPPER GI ENDOSCOPY;  Surgeon: Pedro Earls, MD;  Location: [facility];  Service: General;;    Current Outpatient Medications  Medication Sig Dispense Refill  . cetirizine (ZYRTEC) 10 MG tablet Take 10 mg by mouth daily.    Marland Kitchen HUMIRA PEN 40 MG/0.8ML PNKT     . Pediatric Multivit-Minerals-C (FLINTSTONES COMPLETE PO) Take 1 tablet by mouth at bedtime.     No current facility-administered medications for this visit.    Family History  Problem Relation Age of Onset  . Heart disease Mother   . Hyperlipidemia Mother   . Hypertension Mother   . Diabetes Maternal Grandmother   . Hypertension Maternal Grandmother   . Heart disease Maternal Grandmother   . Breast cancer Other  maternal great aunt  . Heart disease Maternal Grandfather     Review of Systems  All other systems reviewed and are negative.   Exam:   BP 108/70 (BP Location: Right Arm, Patient Position: Sitting, Cuff Size: Large)   Pulse 68   Temp 97.7 F (36.5 C) (Temporal)   Resp 12   Ht 5' 3.5" (1.613 m)   Wt 202 lb (91.6 kg)   BMI 35.22 kg/m    Height: 5' 3.5" (161.3 cm)  Ht Readings from Last 3 Encounters:  09/14/19 5' 3.5" (1.613 m)  05/12/18 5' 4"  (1.626 m)  01/19/18 5' 5"  (1.651 m)    General appearance: alert, cooperative and appears stated age Head: Normocephalic, without obvious abnormality, atraumatic Neck: no adenopathy, supple, symmetrical, trachea midline and thyroid normal to inspection and palpation Lungs: clear to auscultation bilaterally Breasts: normal appearance, no masses or  tenderness Heart: regular rate and rhythm Abdomen: soft, non-tender; bowel sounds normal; no masses,  no organomegaly Extremities: extremities normal, atraumatic, no cyanosis or edema Skin: Skin color, texture, turgor normal. No rashes or lesions Lymph nodes: Cervical, supraclavicular, and axillary nodes normal. No abnormal inguinal nodes palpated Neurologic: Grossly normal   Pelvic: External genitalia:  no lesions              Urethra:  normal appearing urethra with no masses, tenderness or lesions              Bartholins and Skenes: normal                 Vagina: normal appearing vagina with normal color and discharge, no lesions              Cervix: absent              Pap taken: No. Bimanual Exam:  Uterus:  uterus absent              Adnexa: no mass, fullness, tenderness               Rectovaginal: Confirms               Anus:  normal sphincter tone, no lesions  Chaperone, Terence Lux, CMA, was present for exam.  A:  Well Woman with normal exam H/o robotic [facility] 12/11 Frozen shoulder surgery 04/04/18 Psoriatic arthritis, followed by Dr. Trudie Reed H/o hidradenitis  P:   Mammogram guidelines reviewed pap smear not indicated.  Negative 2018. Vaccines reviewed Colon cancer screening obtained today Lipids, TSH and Vit D obtained today Return annually or prn

## 2019-09-15 LAB — LIPID PANEL
Chol/HDL Ratio: 3.1 ratio (ref 0.0–4.4)
Cholesterol, Total: 171 mg/dL (ref 100–199)
HDL: 55 mg/dL (ref >39)
LDL Chol Calc (NIH): 104 mg/dL — ABNORMAL HIGH (ref 0–99)
Triglycerides: 59 mg/dL (ref 0–149)
VLDL Cholesterol Cal: 12 mg/dL (ref 5–40)

## 2019-09-15 LAB — VITAMIN D 25 HYDROXY (VIT D DEFICIENCY, FRACTURES): Vit D, 25-Hydroxy: 14.7 ng/mL — ABNORMAL LOW (ref 30.0–100.0)

## 2019-09-15 LAB — TSH: TSH: 0.662 u[IU]/mL (ref 0.450–4.500)

## 2019-09-17 NOTE — Addendum Note (Signed)
Addended by: Megan Salon on: 09/17/2019 06:08 PM   Modules accepted: Orders

## 2019-09-18 ENCOUNTER — Telehealth: Payer: Self-pay

## 2019-09-18 ENCOUNTER — Encounter: Payer: Self-pay | Admitting: Obstetrics & Gynecology

## 2019-09-18 ENCOUNTER — Other Ambulatory Visit: Payer: Self-pay | Admitting: Obstetrics & Gynecology

## 2019-09-18 MED ORDER — VITAMIN D (ERGOCALCIFEROL) 1.25 MG (50000 UNIT) PO CAPS: 50000.0000 [IU] | ORAL_CAPSULE | ORAL | 0 refills | Status: DC

## 2019-09-18 NOTE — Telephone Encounter (Signed)
-----   Message from Megan Salon, MD sent at 09/17/2019  6:08 PM EST ----- Please let pt know her lipids are fine at 171 and LDLs 104.  Her VIt D is 14.  Recommend prescription Vit D 50K weekly and recheck 3 months.  TSH was normal as well.  Future lab order placed.

## 2019-09-18 NOTE — Telephone Encounter (Signed)
Tried calling patient regarding message as seen below by provider. Unable to reach patient, however, I advised patient via VM that our phones are down, so I will give her a call back later or tomorrow.

## 2019-09-20 LAB — SPECIMEN STATUS REPORT

## 2019-09-20 LAB — MAGNESIUM: Magnesium: 2 mg/dL (ref 1.6–2.3)

## 2019-10-05 IMAGING — DX DG RIBS W/ CHEST 3+V*L*
3 series · 3 of 3 positions shown · non-contrast
Comparison: None.

CLINICAL DATA: Chest pain following physical therapy, initial
encounter

EXAM:
LEFT RIBS AND CHEST - 3+ VIEW

[chest pa]
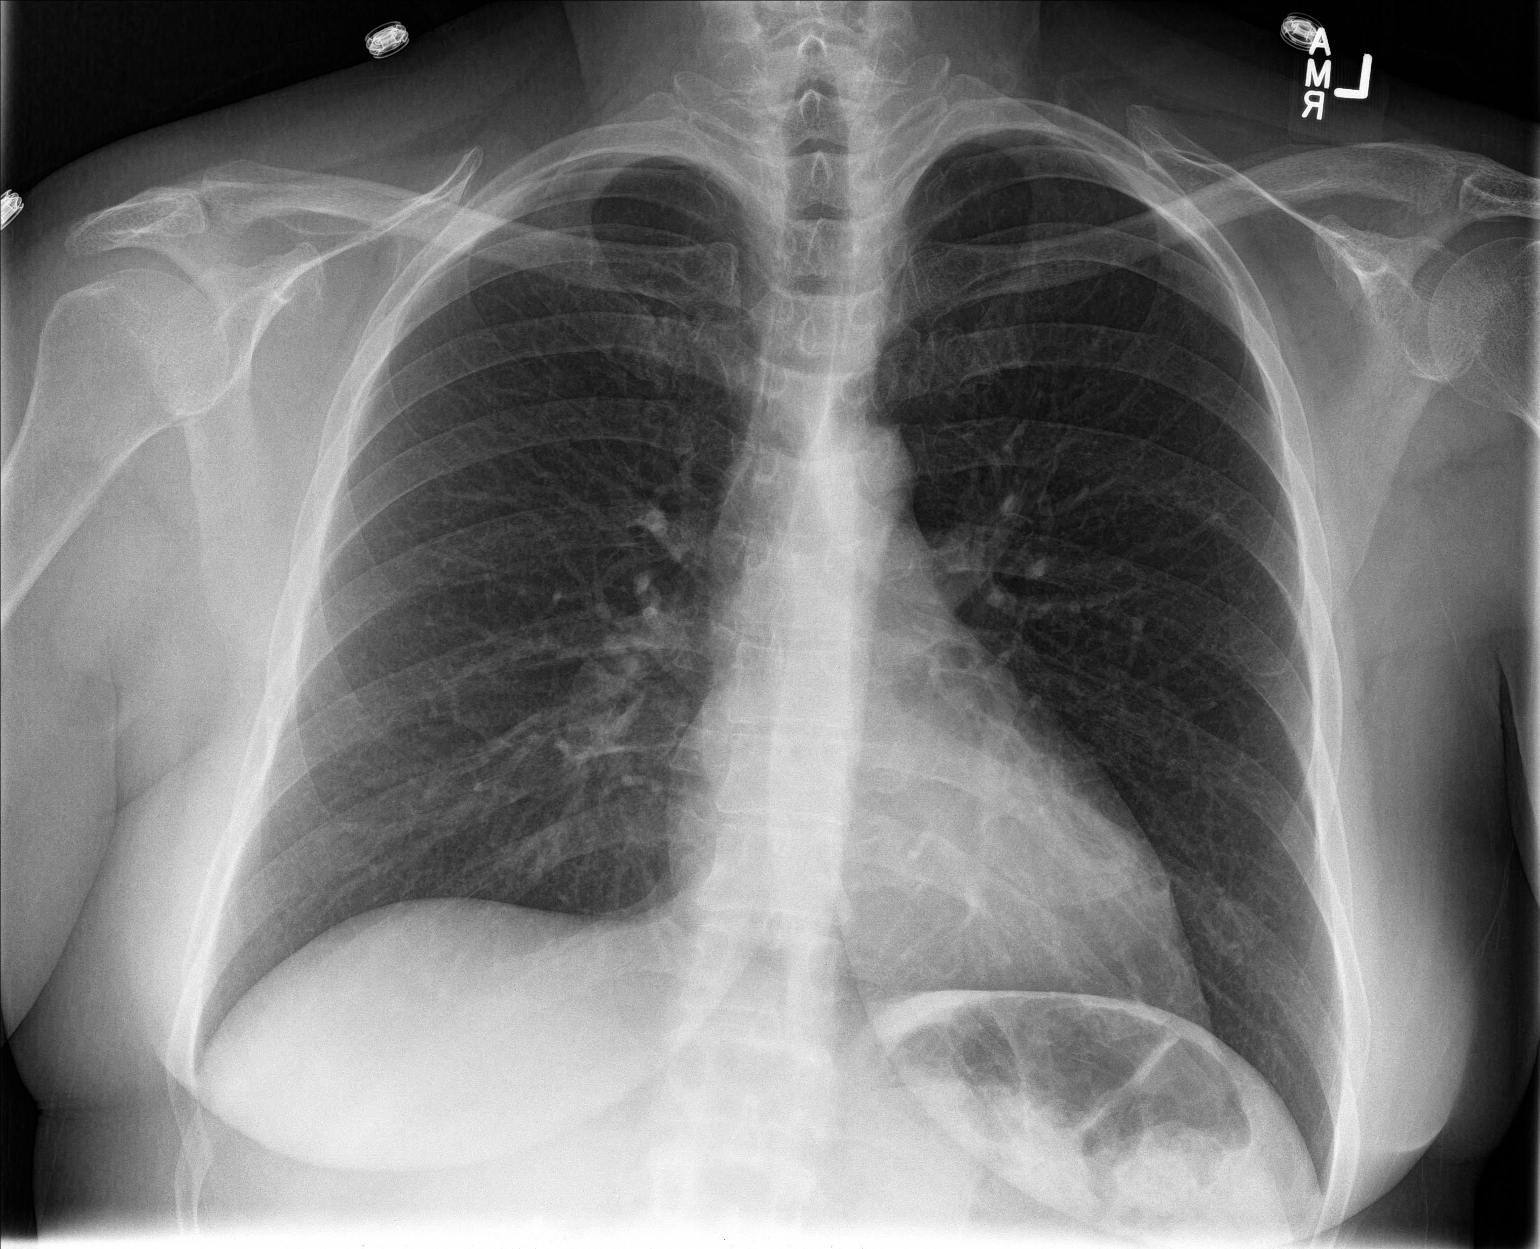

[rib obl (1 of 2)]
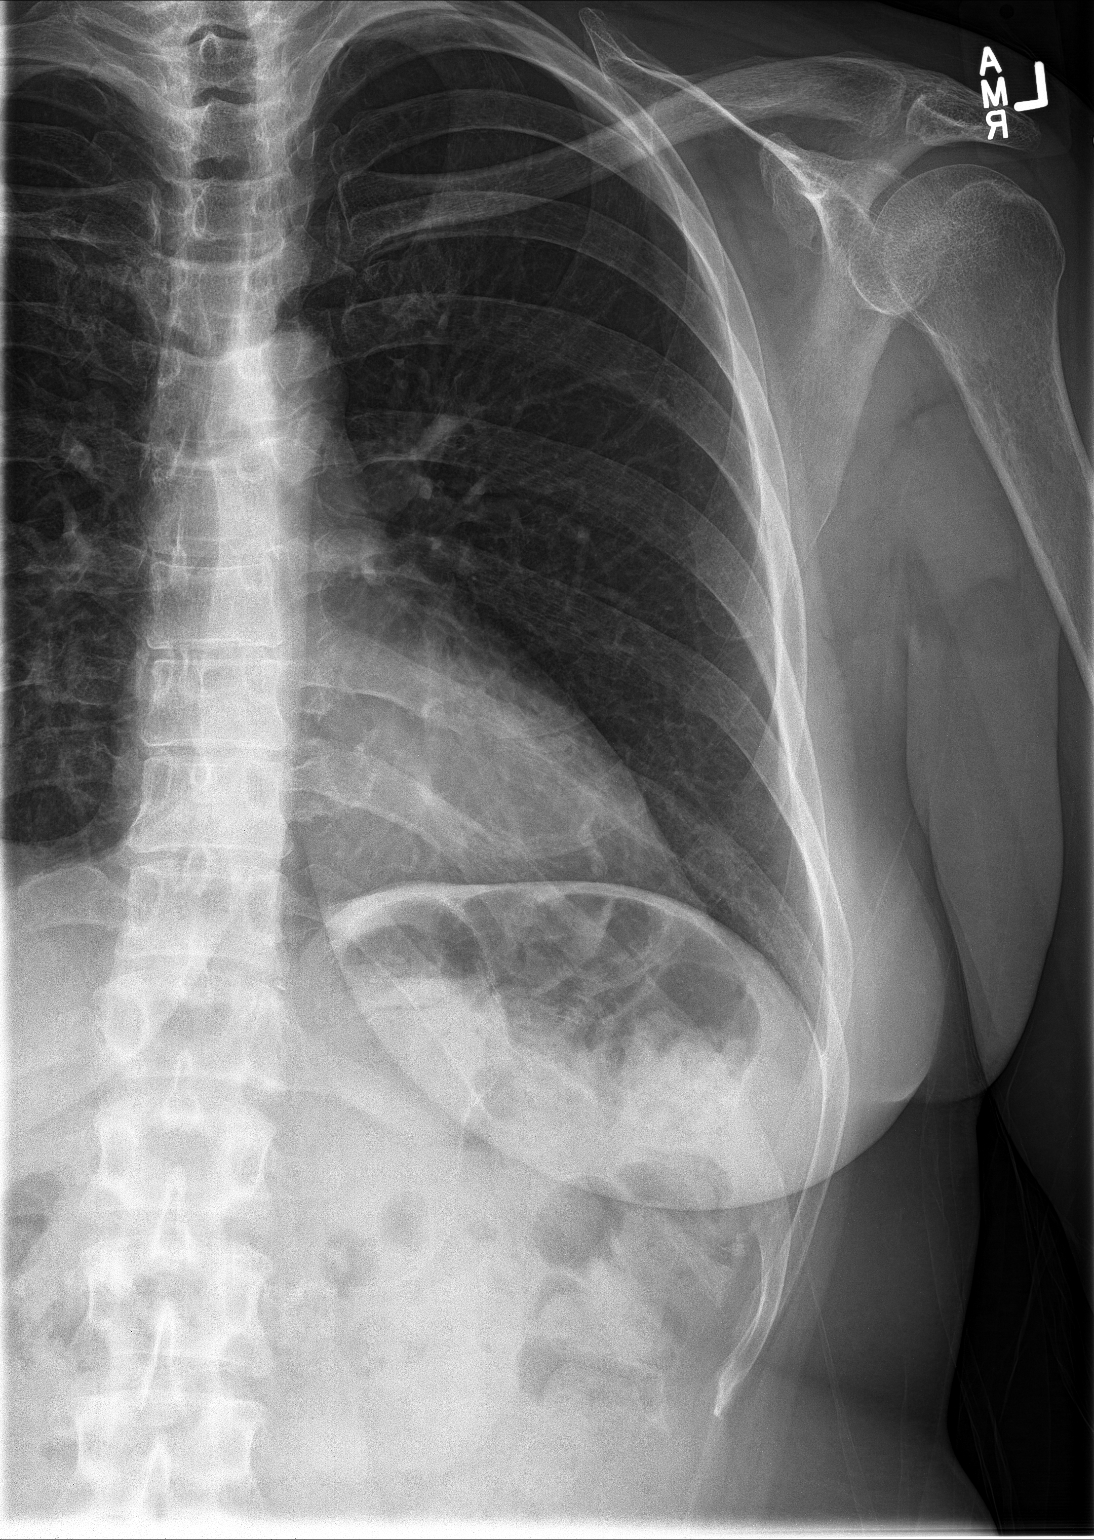

[rib obl (2 of 2)]
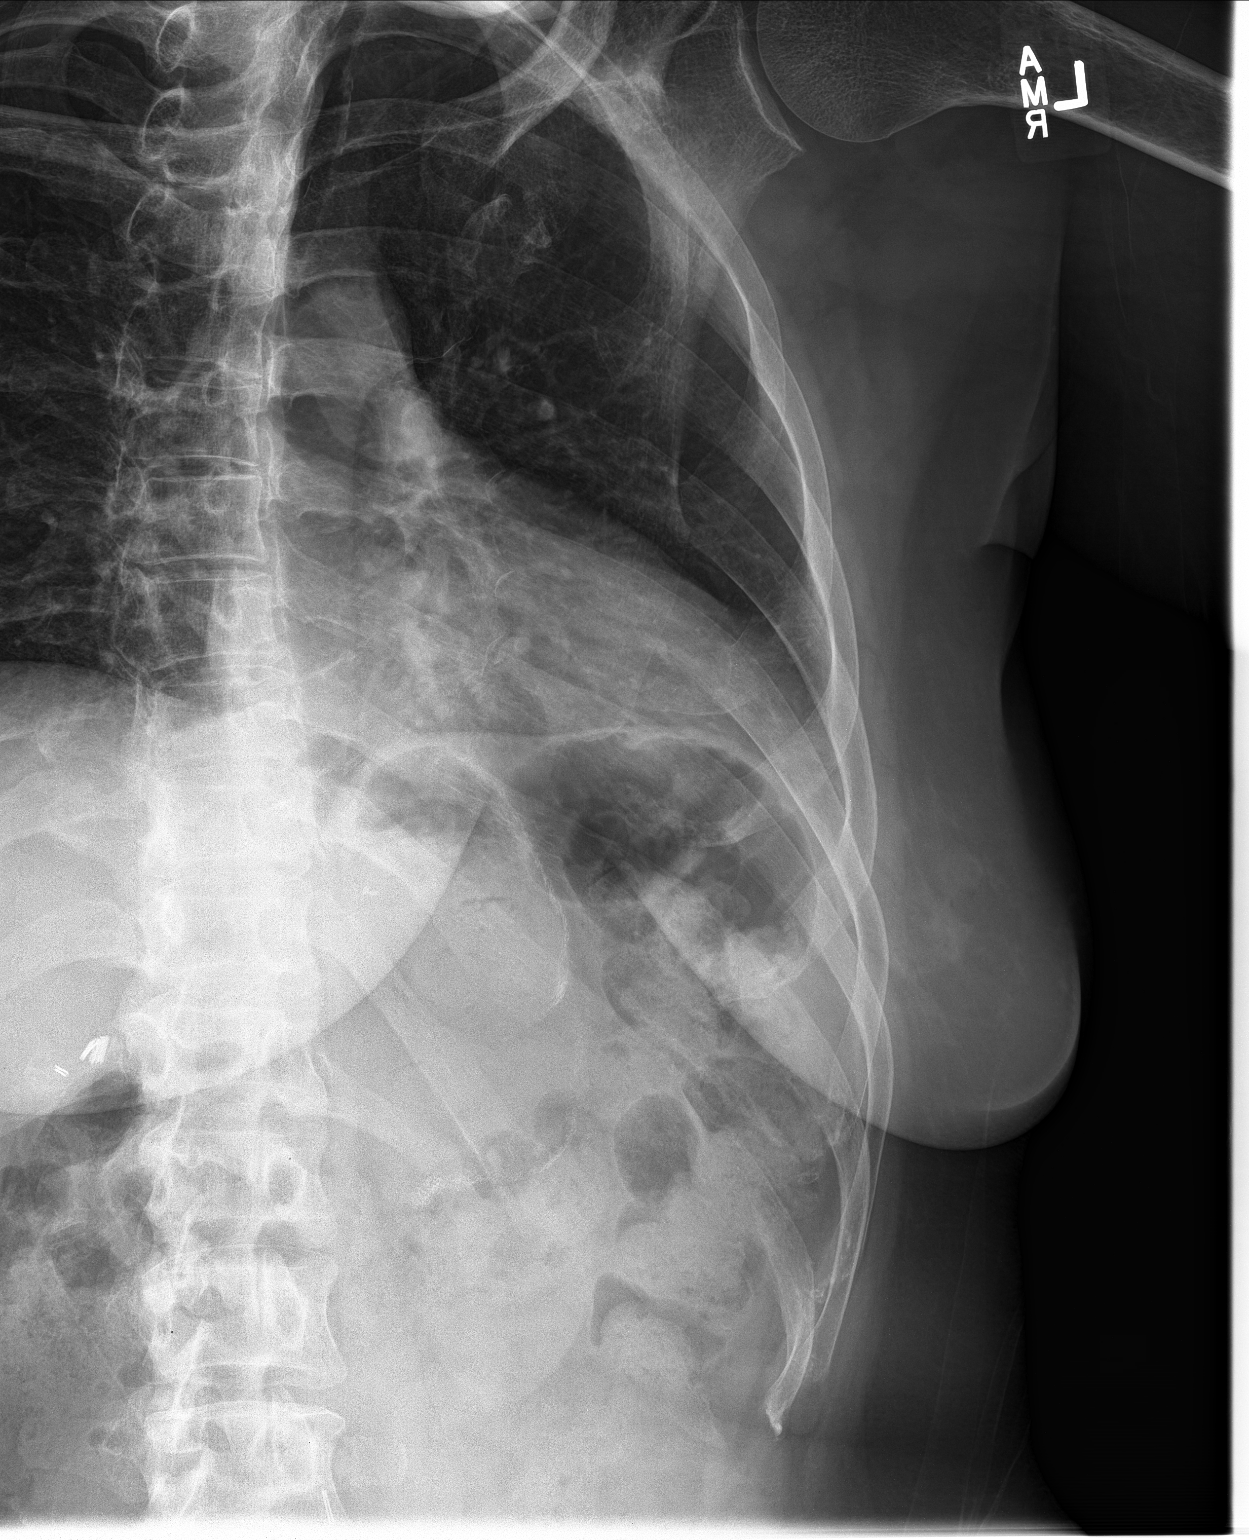

[3 of 3 positions shown; findings below may reference images not displayed]

FINDINGS: Cardiac shadow is within normal limits. The lungs are well aerated
bilaterally. No acute infiltrate or sizable effusion is seen. The
rib cage shows an undisplaced fracture of the left tenth rib
anteriorly. This would correspond with the patient's given clinical
symptomatology. Postsurgical changes in the left upper abdomen are
noted. No pneumothorax is seen.
IMPRESSION: Left tenth rib fracture without complicating factors.

## 2019-12-03 ENCOUNTER — Other Ambulatory Visit: Payer: Self-pay | Admitting: Obstetrics & Gynecology

## 2019-12-03 NOTE — Telephone Encounter (Signed)
Medication refill request: Vit.D 1.28m #12, R0 Last AEX:  09-14-19 Next AEX: 12-04-20 Last MMG (if hormonal medication request): n/a Refill authorized: Patient has f/u lab appointment 12-14-19 to check Vit D. Please advise

## 2019-12-05 NOTE — Telephone Encounter (Signed)
Rx denied until pt has follow up lab work.  Thanks.

## 2019-12-14 ENCOUNTER — Other Ambulatory Visit: Payer: Self-pay

## 2019-12-14 ENCOUNTER — Other Ambulatory Visit (INDEPENDENT_AMBULATORY_CARE_PROVIDER_SITE_OTHER): Payer: 59

## 2019-12-14 DIAGNOSIS — E559 Vitamin D deficiency, unspecified: Secondary | ICD-10-CM

## 2019-12-17 LAB — VITAMIN D 25 HYDROXY (VIT D DEFICIENCY, FRACTURES): Vit D, 25-Hydroxy: 25.8 ng/mL — ABNORMAL LOW (ref 30.0–100.0)

## 2019-12-18 ENCOUNTER — Other Ambulatory Visit: Payer: Self-pay | Admitting: Obstetrics & Gynecology

## 2019-12-18 MED ORDER — VITAMIN D (ERGOCALCIFEROL) 1.25 MG (50000 UNIT) PO CAPS: 50000.0000 [IU] | ORAL_CAPSULE | ORAL | 3 refills | Status: DC

## 2020-03-20 ENCOUNTER — Emergency Department (HOSPITAL_COMMUNITY)
Admission: EM | Admit: 2020-03-20 | Discharge: 2020-03-20 | Disposition: A | Discharge status: Home / Self Care | Payer: 59 | Attending: Emergency Medicine | Admitting: Emergency Medicine

## 2020-03-20 ENCOUNTER — Other Ambulatory Visit: Payer: Self-pay

## 2020-03-20 ENCOUNTER — Emergency Department (HOSPITAL_COMMUNITY): Payer: 59

## 2020-03-20 ENCOUNTER — Emergency Department (HOSPITAL_COMMUNITY): Admission: EM | Admit: 2020-03-20 | Payer: 59 | Source: Home / Self Care

## 2020-03-20 DIAGNOSIS — Y999 Unspecified external cause status: Secondary | ICD-10-CM | POA: Diagnosis not present

## 2020-03-20 DIAGNOSIS — Y9301 Activity, walking, marching and hiking: Secondary | ICD-10-CM | POA: Insufficient documentation

## 2020-03-20 DIAGNOSIS — W010XXA Fall on same level from slipping, tripping and stumbling without subsequent striking against object, initial encounter: Secondary | ICD-10-CM | POA: Diagnosis not present

## 2020-03-20 DIAGNOSIS — Z79899 Other long term (current) drug therapy: Secondary | ICD-10-CM | POA: Insufficient documentation

## 2020-03-20 DIAGNOSIS — Y929 Unspecified place or not applicable: Secondary | ICD-10-CM | POA: Insufficient documentation

## 2020-03-20 DIAGNOSIS — S82852A Displaced trimalleolar fracture of left lower leg, initial encounter for closed fracture: Secondary | ICD-10-CM | POA: Diagnosis not present

## 2020-03-20 DIAGNOSIS — S99912A Unspecified injury of left ankle, initial encounter: Secondary | ICD-10-CM | POA: Diagnosis present

## 2020-03-20 MED ORDER — HYDROCODONE-ACETAMINOPHEN 5-325 MG PO TABS: 1.0000 | ORAL_TABLET | Freq: Four times a day (QID) | ORAL | 0 refills | Status: DC | PRN

## 2020-03-20 MED ORDER — PROPOFOL 10 MG/ML IV BOLUS
INTRAVENOUS | Status: AC | PRN
  Administered 2020-03-20 (×2): 45.35 mg via INTRAVENOUS

## 2020-03-20 MED ORDER — PROPOFOL 10 MG/ML IV BOLUS
0.5000 mg/kg | Freq: Once | INTRAVENOUS | Status: AC
  Administered 2020-03-20: 45.4 mg via INTRAVENOUS
  Filled 2020-03-20: qty 20

## 2020-03-20 MED ORDER — HYDROMORPHONE HCL 1 MG/ML IJ SOLN
0.5000 mg | Freq: Once | INTRAMUSCULAR | Status: AC
  Administered 2020-03-20: 0.5 mg via INTRAVENOUS
  Filled 2020-03-20: qty 1

## 2020-03-20 MED ORDER — FENTANYL CITRATE (PF) 100 MCG/2ML IJ SOLN
100.0000 ug | Freq: Once | INTRAMUSCULAR | Status: AC
  Administered 2020-03-20: 100 ug via INTRAVENOUS
  Filled 2020-03-20: qty 2

## 2020-03-20 NOTE — ED Provider Notes (Signed)
[facility] EMERGENCY DEPARTMENT Provider Note   CSN: [id number] Arrival date & time: 03/20/20  4270     History No chief complaint on file.   Mary Davenport is a 44 y.o. female.  HPI Patient presents after a left ankle injury.  States she was walking her dog got tangled and fell injuring her left ankle.  States it was deformed.  No other injury.  No knee or hip pain.  No loss conscious.  Did not hit head.  Has seen [facility] in the past.    Past Medical History:  Diagnosis Date  . Allergy    tx. occ. with OTC meds  . Arthritis    feet, back, hips  . Asthma    has been controlled, not rescue inhaler at present  . Carpal tunnel syndrome   . Complication of anesthesia    coming out of anesthesia with D & C-combative,not breathing, when allowed to come out slower, this was not an issue  . Depression   . Endometriosis of ovary    more resolved since hysterectomy  . GERD (gastroesophageal reflux disease)   . Headache(784.0)    hx. migraines, none in 5 yrs  . Heart murmur   . Hiatal hernia   . Hidradenitis suppurativa    followed by [facility]   . MRSA carrier   . Obesity   . Ovarian cyst    left   . Plantar fasciitis 2009  . PONV (postoperative nausea and vomiting)   . Psoriatic arthritis ([place])    followed by Dr. Berna Bue at [facility]     Patient Active Problem List   Diagnosis Date Noted  . Hidradenitis suppurativa 02/24/2017  . 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 GERD (gastroesophageal reflux disease) 04/25/2013    Past Surgical History:  Procedure Laterality Date  . CHOLECYSTECTOMY     laparoscopic  . DIAGNOSTIC LAPAROSCOPY  8/96   lysis/endometriosis (LKS)  . DILATION AND CURETTAGE OF UTERUS    . LAPAROSCOPIC GASTRIC SLEEVE RESECTION N/A 07/03/2013   Procedure: LAPAROSCOPIC GASTRIC SLEEVE RESECTION;  Surgeon: Pedro Earls, MD;   Location: [facility];  Service: General;  Laterality: N/A;  . LAPAROSCOPIC TOTAL HYSTERECTOMY  07/2010   robotic assisted  . SHOULDER SURGERY Left 04/04/2018   scar tissue   . TONSILLECTOMY    . UPPER GI ENDOSCOPY  07/03/2013   Procedure: UPPER GI ENDOSCOPY;  Surgeon: Pedro Earls, MD;  Location: [facility];  Service: General;;     OB History    Gravida  3   Para  2   Term      Preterm      AB      Living  2     SAB      TAB      Ectopic      Multiple      Live Births              Family History  Problem Relation Age of Onset  . Heart disease Mother   . Hyperlipidemia Mother   . Hypertension Mother   . Diabetes Maternal Grandmother   . Hypertension Maternal Grandmother   . Heart disease Maternal Grandmother   . Breast cancer Other        maternal great aunt  . Heart disease Maternal Grandfather     Social History   Tobacco Use  . Smoking status: Never Smoker  .  Smokeless tobacco: Never Used  Vaping Use  . Vaping Use: Never used  Substance Use Topics  . Alcohol use: No    Alcohol/week: 0.0 standard drinks  . Drug use: No    Home Medications Prior to Admission medications   Medication Sig Start Date End Date Taking? Authorizing Provider  cetirizine (ZYRTEC) 10 MG tablet Take 10 mg by mouth daily.    [provider]  HUMIRA PEN 40 MG/0.8ML PNKT  02/11/17   [provider]  HYDROcodone-acetaminophen (NORCO/VICODIN) 5-325 MG tablet Take 1-2 tablets by mouth every 6 (six) hours as needed. 03/20/20   Davonna Belling, MD  Pediatric Multivit-Minerals-C [facility] COMPLETE PO) Take 1 tablet by mouth at bedtime.    [provider]  Vitamin D, Ergocalciferol, (DRISDOL) 1.25 MG (50000 UNIT) CAPS capsule Take 1 capsule (50,000 Units total) by mouth every 7 (seven) days. 12/18/19   Megan Salon, MD    Allergies    Iodinated diagnostic agents, Lactose intolerance (gi), Shellfish allergy, Sulfa antibiotics, Whey protein [protein],  Erythromycin, and Sudafed [pseudoephedrine]  Review of Systems   Review of Systems  Constitutional: Negative for appetite change.  HENT: Negative for congestion.   Respiratory: Negative for shortness of breath.   Cardiovascular: Positive for leg swelling.  Gastrointestinal: Negative for abdominal distention.  Genitourinary: Negative for flank pain.  Musculoskeletal:       Left ankle pain.  Skin: Negative for rash.  Neurological: Negative for weakness and numbness.    Physical Exam Updated Vital Signs BP 124/69 (BP Location: Right Arm)   Pulse 70   Temp 98.3 F (36.8 C) (Oral)   Resp 14   Ht 5' 5"  (1.651 m)   Wt 90.7 kg   SpO2 100%   BMI 33.28 kg/m   Physical Exam Vitals and nursing note reviewed.  HENT:     Head: Atraumatic.  Eyes:     Pupils: Pupils are equal, round, and reactive to light.  Cardiovascular:     Rate and Rhythm: Regular rhythm.  Pulmonary:     Breath sounds: No wheezing or rhonchi.  Abdominal:     Tenderness: There is no abdominal tenderness.  Musculoskeletal:        General: Tenderness present.     Comments: Tenderness and deformity to left ankle.  Foot angulated somewhat externally.  Sensation intact.  Skin intact.  No tenderness over proximal fibula.  Skin:    General: Skin is warm.     Capillary Refill: Capillary refill takes less than 2 seconds.  Neurological:     Mental Status: She is alert and oriented to person, place, and time.     ED Results / Procedures / Treatments   Labs (all labs ordered are listed, but only abnormal results are displayed) Labs Reviewed - No data to display  EKG None  Radiology DG Tibia/Fibula Left  Result Date: 03/20/2020 CLINICAL DATA:  Post reduction status post trimalleolar fracture EXAM: LEFT TIBIA AND FIBULA - 2 VIEW COMPARISON:  Previous x-ray of March 20, 2020 prior to reduction. FINDINGS: Casting material overlies the lower extremity limiting bony detail. There is improved alignment with respect to  the fracture dislocation about the RIGHT ankle. Tibial talar relationship is improved. There is persistent posterior displacement and overlapping of the lateral malleolus and some persistent displacement of posterior distal tibia/posterior malleolus. Near anatomic alignment of the medial malleolus. No signs of proximal tibial or fibular fracture. IMPRESSION: Improved alignment status post reduction. Persistent posterior displacement and overlapping/foreshortening of the lateral  and posterior malleoli. Electronically Signed   By: Zetta Bills M.D.   On: 03/20/2020 10:02   DG Ankle 2 Views Left  Result Date: 03/20/2020 CLINICAL DATA:  Post reduction EXAM: LEFT ANKLE - 2 VIEW COMPARISON:  Pre reduction films acquired same date FINDINGS: Overlapping and posterior displacement of lateral malleolus with moderate displacement malleolus persists but with improved anatomic alignment and improved tibial talar relationships. There is mild posterior subluxation of the talus relative to the distal tibia. Lateral translation of the talus is markedly improved with near anatomic alignment. There is some irregularity along the anterior talar neck. Seen on the lateral view. This is of uncertain significance. IMPRESSION: 1. Improved alignment of the trimalleolar fracture subluxation of the left ankle. 2. Mild irregularity along the anterior talar neck. May represent additional fracture of the talus. CT may be helpful for further evaluation. Electronically Signed   By: Zetta Bills M.D.   On: 03/20/2020 10:07   DG Ankle Complete Left  Result Date: 03/20/2020 CLINICAL DATA:  Twisted ankle.  Fall. EXAM: LEFT ANKLE COMPLETE - 3+ VIEW COMPARISON:  No recent. FINDINGS: Severely this trimalleolar fractures noted with disruption of the tibiotalar joint space. Associated severe soft tissue swelling. No radiopaque foreign body. IMPRESSION: Severely displaced trimalleolar fractures with disruption of the tibiotalar joint space  Electronically Signed   By: Marcello Moores  Register   On: 03/20/2020 08:24   CT Ankle Left Wo Contrast  Result Date: 03/20/2020 CLINICAL DATA:  The patient suffered a left ankle fracture today when she was tripped by dog. Initial encounter. EXAM: CT OF THE LEFT ANKLE WITHOUT CONTRAST TECHNIQUE: Multidetector CT imaging of the left ankle was performed according to the standard protocol. Multiplanar CT image reconstructions were also generated. COMPARISON:  Plain films left ankle earlier today. FINDINGS: Bones/Joint/Cartilage As seen on the comparison examination, the patient has an acute trimalleolar fracture. The medial malleolus is inferiorly displaced approximately 0.5 cm. The posterior malleolus is comminuted and superiorly displaced up to 0.8 cm. The fragment of posterior malleolus measures up to 1.4 cm AP at the articular surface and traverses the entire posterior aspect of the distal tibia. The patient's fracture of the distal fibula is oblique in orientation extending from the posterior cortex 5 cm above the tip of the lateral malleolus in an anterior and inferior orientation exiting the anterior cortex 3 cm above the tip of the lateral malleolus. The distal fragment is posteriorly displaced approximately 0.6 cm. Except as described, no fracture is seen. Calcaneal spurring is noted. Small bone island in the calcaneus is also identified. Ligaments Suboptimally assessed by CT. Appear intact by CT. The patient's fracture of the lateral malleolus involves the expected attachment site of the anterior, inferior tibiofibular ligament. Muscles and Tendons Intact.  No tendon entrapment. Soft tissues Soft tissue contusion about the ankle noted. IMPRESSION: Acute trimalleolar fracture as described above. Electronically Signed   By: Inge Rise M.D.   On: 03/20/2020 11:20    Procedures .Ortho Injury Treatment  Date/Time: 03/20/2020 4:19 PM Performed by: Davonna Belling, MD Authorized by: Davonna Belling, MD     Consent:    Consent obtained:  Verbal   Consent given by:  Patient   Risks discussed:  Fracture, irreducible dislocation, recurrent dislocation, nerve damage and restricted joint movement   Alternatives discussed:  No treatment and alternative treatmentInjury location: ankle Location details: left ankle Injury type: fracture-dislocation Fracture type: trimalleolar Pre-procedure neurovascular assessment: neurovascularly intact Pre-procedure distal perfusion: normal Pre-procedure neurological function: normal Pre-procedure range  of motion: reduced  Anesthesia: Local anesthesia used: no  Patient sedated: Yes. Refer to sedation procedure documentation for details of sedation. Manipulation performed: yes Reduction successful: yes X-ray confirmed reduction: yes Immobilization: splint Splint type: short leg Supplies used: Ortho-Glass Post-procedure neurovascular assessment: post-procedure neurovascularly intact Post-procedure distal perfusion: normal Post-procedure neurological function: normal Post-procedure range of motion: improved Patient tolerance: patient tolerated the procedure well with no immediate complications  .Sedation  Date/Time: 03/20/2020 9:30 AM Performed by: Davonna Belling, MD Authorized by: Davonna Belling, MD   Consent:    Consent obtained:  Verbal   Consent given by:  Patient   Risks discussed:  Dysrhythmia, inadequate sedation, nausea, allergic reaction, vomiting, respiratory compromise necessitating ventilatory assistance and intubation and prolonged hypoxia resulting in organ damage   Alternatives discussed:  Analgesia without sedation Universal protocol:    Procedure explained and questions answered to patient or proxy's satisfaction: yes     Relevant documents present and verified: yes     Test results available and properly labeled: yes     Imaging studies available: yes     Immediately prior to procedure a time out was called: yes     Patient  identity confirmation method:  Arm band and verbally with patient Indications:    Procedure performed:  Dislocation reduction   Procedure necessitating sedation performed by:  Physician performing sedation Pre-sedation assessment:    Time since last food or drink:  8 hrs   ASA classification: class 2 - patient with mild systemic disease     Neck mobility: normal     Mouth opening:  2 finger widths   Thyromental distance:  4 finger widths   Mallampati score:  III - soft palate, base of uvula visible   Pre-sedation assessments completed and reviewed: airway patency, cardiovascular function, hydration status, mental status and nausea/vomiting   Immediate pre-procedure details:    Reassessment: Patient reassessed immediately prior to procedure     Verified: bag valve mask available, emergency equipment available, intubation equipment available and IV patency confirmed   Procedure details (see MAR for exact dosages):    Preoxygenation:  Nasal cannula   Sedation:  Propofol   Intended level of sedation: deep   Analgesia:  Fentanyl   Intra-procedure monitoring:  Blood pressure monitoring, cardiac monitor, continuous pulse oximetry and continuous capnometry   Intra-procedure events: none     Total Provider sedation time (minutes):  10 Post-procedure details:    Post-sedation assessment completed:  03/20/2020 10:00 AM   Attendance: Constant attendance by certified staff until patient recovered     Recovery: Patient returned to pre-procedure baseline     (including critical care time)  Medications Ordered in ED Medications  fentaNYL (SUBLIMAZE) injection 100 mcg (100 mcg Intravenous Given 03/20/20 0908)  propofol (DIPRIVAN) 10 mg/mL bolus/IV push 45.4 mg (45.4 mg Intravenous Given 03/20/20 0922)  propofol (DIPRIVAN) 10 mg/mL bolus/IV push (45.35 mg Intravenous Given 03/20/20 0931)  HYDROmorphone (DILAUDID) injection 0.5 mg (0.5 mg Intravenous Given 03/20/20 1022)    ED Course  I have reviewed  the triage vital signs and the nursing notes.  Pertinent labs & imaging results that were available during my care of the patient were reviewed by me and considered in my medical decision making (see chart for details).    MDM Rules/Calculators/A&P                          Patient with trimalleolar fracture.  Reduced in ER under  sedation.  Improved alignment.  Follow-up with orthopedic surgery.  Seen in the ER by Hilbert Odor also. Final Clinical Impression(s) / ED Diagnoses Final diagnoses:  Trimalleolar fracture of ankle, closed, left, initial encounter    Rx / DC Orders ED Discharge Orders         Ordered    HYDROcodone-acetaminophen (NORCO/VICODIN) 5-325 MG tablet  Every 6 hours PRN     Discontinue  Reprint     03/20/20 1121           Davonna Belling, MD 03/20/20 1623

## 2020-03-20 NOTE — Progress Notes (Signed)
Orthopedic Tech Progress Note Patient Details:  Mary Davenport Aug 08, 1976 [id number]  Ortho Devices Type of Ortho Device: Stirrup splint, Post splint Splint Material: Fiberglass Ortho Device/Splint Location: LLE Ortho Device/Splint Interventions: Ordered, Application   Post Interventions Patient Tolerated: Well Instructions Provided: Other (comment)   [name] 03/20/2020, 9:51 AM

## 2020-03-20 NOTE — ED Triage Notes (Signed)
Pt was walking her dog this morning, got tangled in the leash, and fell, landing on her buttocks but injuring her L ankle. Swelling, pain, and bruising noted. Ice already applied.

## 2020-03-20 NOTE — ED Notes (Signed)
Patient transported to CT 

## 2020-03-20 NOTE — Consult Note (Signed)
Reason for Consult:Left ankle fx Referring Physician: Robyn Haber  Mary Davenport is an 44 y.o. female.  HPI: Mary Davenport was in her yard with her son's dog on a tie out. Another dog was being walked and the dog ran after it tripping her. She saw and heard her ankle break as she fell. She had immediate pain and could not bear weight. She was brought to the ED where x-rays confirmed the fx and orthopedic surgery was consulted. She works as an Biochemist, clinical.  Past Medical History:  Diagnosis Date  . Allergy    tx. occ. with OTC meds  . Arthritis    feet, back, hips  . Asthma    has been controlled, not rescue inhaler at present  . Carpal tunnel syndrome   . Complication of anesthesia    coming out of anesthesia with D & C-combative,not breathing, when allowed to come out slower, this was not an issue  . Depression   . Endometriosis of ovary    more resolved since hysterectomy  . GERD (gastroesophageal reflux disease)   . Headache(784.0)    hx. migraines, none in 5 yrs  . Heart murmur   . Hiatal hernia   . Hidradenitis suppurativa    followed by [facility]   . MRSA carrier   . Obesity   . Ovarian cyst    left   . Plantar fasciitis 2009  . PONV (postoperative nausea and vomiting)   . Psoriatic arthritis ([place])    followed by Dr. Berna Bue at [facility]     Past Surgical History:  Procedure Laterality Date  . CHOLECYSTECTOMY     laparoscopic  . DIAGNOSTIC LAPAROSCOPY  8/96   lysis/endometriosis (LKS)  . DILATION AND CURETTAGE OF UTERUS    . LAPAROSCOPIC GASTRIC SLEEVE RESECTION N/A 07/03/2013   Procedure: LAPAROSCOPIC GASTRIC SLEEVE RESECTION;  Surgeon: Pedro Earls, MD;  Location: [facility];  Service: General;  Laterality: N/A;  . LAPAROSCOPIC TOTAL HYSTERECTOMY  07/2010   robotic assisted  . SHOULDER SURGERY Left 04/04/2018   scar tissue   . TONSILLECTOMY    . UPPER GI ENDOSCOPY  07/03/2013   Procedure:  UPPER GI ENDOSCOPY;  Surgeon: Pedro Earls, MD;  Location: [facility];  Service: General;;    Family History  Problem Relation Age of Onset  . Heart disease Mother   . Hyperlipidemia Mother   . Hypertension Mother   . Diabetes Maternal Grandmother   . Hypertension Maternal Grandmother   . Heart disease Maternal Grandmother   . Breast cancer Other        maternal great aunt  . Heart disease Maternal Grandfather     Social History:  reports that she has never smoked. She has never used smokeless tobacco. She reports that she does not drink alcohol and does not use drugs.  Allergies:  Allergies  Allergen Reactions  . Iodinated Diagnostic Agents Hives  . Lactose Intolerance (Gi) Other (See Comments)    Severe abdominal pain and vomiting  . Shellfish Allergy Nausea And Vomiting  . Sulfa Antibiotics Hives  . Whey Protein [Protein] Other (See Comments)    Severe abdominal pain and vomiting  . Erythromycin Rash  . Sudafed [Pseudoephedrine] Palpitations    Medications: I have reviewed the patient's current medications.  No results found for this or any previous visit (from the past 48 hour(s)).  DG Tibia/Fibula Left  Result Date: 03/20/2020 CLINICAL DATA:  Post reduction status post trimalleolar fracture  EXAM: LEFT TIBIA AND FIBULA - 2 VIEW COMPARISON:  Previous x-ray of March 20, 2020 prior to reduction. FINDINGS: Casting material overlies the lower extremity limiting bony detail. There is improved alignment with respect to the fracture dislocation about the RIGHT ankle. Tibial talar relationship is improved. There is persistent posterior displacement and overlapping of the lateral malleolus and some persistent displacement of posterior distal tibia/posterior malleolus. Near anatomic alignment of the medial malleolus. No signs of proximal tibial or fibular fracture. IMPRESSION: Improved alignment status post reduction. Persistent posterior displacement and overlapping/foreshortening of  the lateral and posterior malleoli. Electronically Signed   By: Zetta Bills M.D.   On: 03/20/2020 10:02   DG Ankle 2 Views Left  Result Date: 03/20/2020 CLINICAL DATA:  Post reduction EXAM: LEFT ANKLE - 2 VIEW COMPARISON:  Pre reduction films acquired same date FINDINGS: Overlapping and posterior displacement of lateral malleolus with moderate displacement malleolus persists but with improved anatomic alignment and improved tibial talar relationships. There is mild posterior subluxation of the talus relative to the distal tibia. Lateral translation of the talus is markedly improved with near anatomic alignment. There is some irregularity along the anterior talar neck. Seen on the lateral view. This is of uncertain significance. IMPRESSION: 1. Improved alignment of the trimalleolar fracture subluxation of the left ankle. 2. Mild irregularity along the anterior talar neck. May represent additional fracture of the talus. CT may be helpful for further evaluation. Electronically Signed   By: Zetta Bills M.D.   On: 03/20/2020 10:07   DG Ankle Complete Left  Result Date: 03/20/2020 CLINICAL DATA:  Twisted ankle.  Fall. EXAM: LEFT ANKLE COMPLETE - 3+ VIEW COMPARISON:  No recent. FINDINGS: Severely this trimalleolar fractures noted with disruption of the tibiotalar joint space. Associated severe soft tissue swelling. No radiopaque foreign body. IMPRESSION: Severely displaced trimalleolar fractures with disruption of the tibiotalar joint space Electronically Signed   By: Marcello Moores  Register   On: 03/20/2020 08:24    Review of Systems  HENT: Negative for ear discharge, ear pain, hearing loss and tinnitus.   Eyes: Negative for photophobia and pain.  Respiratory: Negative for cough and shortness of breath.   Cardiovascular: Negative for chest pain.  Gastrointestinal: Negative for abdominal pain, nausea and vomiting.  Genitourinary: Negative for dysuria, flank pain, frequency and urgency.  Musculoskeletal:  Positive for arthralgias (Left ankle). Negative for back pain, myalgias and neck pain.  Neurological: Negative for dizziness and headaches.  Hematological: Does not bruise/bleed easily.  Psychiatric/Behavioral: The patient is not nervous/anxious.    Blood pressure (!) 117/63, pulse 68, temperature 98.5 F (36.9 C), temperature source Oral, resp. rate 15, height 5' 5"  (1.651 m), weight 90.7 kg, SpO2 100 %. Physical Exam Constitutional:      General: She is not in acute distress.    Appearance: She is well-developed. She is not diaphoretic.  HENT:     Head: Normocephalic and atraumatic.  Eyes:     General: No scleral icterus.       Right eye: No discharge.        Left eye: No discharge.     Conjunctiva/sclera: Conjunctivae normal.  Cardiovascular:     Rate and Rhythm: Normal rate and regular rhythm.  Pulmonary:     Effort: Pulmonary effort is normal. No respiratory distress.  Musculoskeletal:     Cervical back: Normal range of motion.     Comments: LLE No traumatic wounds, ecchymosis, or rash  Short leg splint in place  No knee effusion  Knee stable to varus/ valgus and anterior/posterior stress  Sens DPN, SPN, TN intact  Motor EHL 5/5  Toes perfused, No significant edema  Skin:    General: Skin is warm and dry.  Neurological:     Mental Status: She is alert.  Psychiatric:        Behavior: Behavior normal.     Assessment/Plan: Left ankle fx -- Will get CT before discharge. She may f/u with Dr. Doreatha Martin or Dr. Doran Durand in about a week. NWB, ice, and elevation in the meantime.    Lisette Abu, PA-C Orthopedic Surgery [phone] 03/20/2020, 10:20 AM

## 2020-04-06 HISTORY — PX: ANKLE FRACTURE SURGERY: SHX122

## 2020-09-24 ENCOUNTER — Other Ambulatory Visit: Payer: Self-pay | Admitting: Obstetrics & Gynecology

## 2020-09-24 DIAGNOSIS — Z1231 Encounter for screening mammogram for malignant neoplasm of breast: Secondary | ICD-10-CM

## 2020-10-28 ENCOUNTER — Encounter (HOSPITAL_BASED_OUTPATIENT_CLINIC_OR_DEPARTMENT_OTHER): Payer: Self-pay | Admitting: Obstetrics & Gynecology

## 2020-10-28 ENCOUNTER — Ambulatory Visit (INDEPENDENT_AMBULATORY_CARE_PROVIDER_SITE_OTHER): Payer: 59 | Admitting: Obstetrics & Gynecology

## 2020-10-28 ENCOUNTER — Other Ambulatory Visit (HOSPITAL_BASED_OUTPATIENT_CLINIC_OR_DEPARTMENT_OTHER)
Admission: RE | Admit: 2020-10-28 | Discharge: 2020-10-28 | Disposition: A | Discharge status: Home / Self Care | Payer: 59 | Source: Ambulatory Visit | Attending: Obstetrics & Gynecology | Admitting: Obstetrics & Gynecology

## 2020-10-28 ENCOUNTER — Other Ambulatory Visit: Payer: Self-pay

## 2020-10-28 VITALS — BP 129/84 | HR 67 | Ht 63.5 in | Wt 187.4 lb

## 2020-10-28 DIAGNOSIS — Z114 Encounter for screening for human immunodeficiency virus [HIV]: Secondary | ICD-10-CM

## 2020-10-28 DIAGNOSIS — E559 Vitamin D deficiency, unspecified: Secondary | ICD-10-CM | POA: Diagnosis not present

## 2020-10-28 DIAGNOSIS — L732 Hidradenitis suppurativa: Secondary | ICD-10-CM

## 2020-10-28 DIAGNOSIS — Z01419 Encounter for gynecological examination (general) (routine) without abnormal findings: Secondary | ICD-10-CM | POA: Diagnosis not present

## 2020-10-28 DIAGNOSIS — L4050 Arthropathic psoriasis, unspecified: Secondary | ICD-10-CM

## 2020-10-28 DIAGNOSIS — Z1211 Encounter for screening for malignant neoplasm of colon: Secondary | ICD-10-CM

## 2020-10-28 DIAGNOSIS — Z0000 Encounter for general adult medical examination without abnormal findings: Secondary | ICD-10-CM

## 2020-10-28 DIAGNOSIS — Z90710 Acquired absence of both cervix and uterus: Secondary | ICD-10-CM

## 2020-10-28 LAB — VITAMIN D 25 HYDROXY (VIT D DEFICIENCY, FRACTURES): Vit D, 25-Hydroxy: 28.63 ng/mL — ABNORMAL LOW (ref 30–100)

## 2020-10-28 LAB — HIV ANTIBODY (ROUTINE TESTING W REFLEX): HIV Screen 4th Generation wRfx: NONREACTIVE

## 2020-10-28 NOTE — Patient Instructions (Signed)
Mary Richmond Fuchs, MD, PhD Pediatric Neurosurgeon, Pediatric Spine Surgeon

## 2020-10-28 NOTE — Progress Notes (Signed)
45 y.o. G3P2 Married White or Caucasian female here for annual exam.  Biggest issue of the last year was left ankle fracture.  Had ankle surgery on 03/19/2020.  She was in a cast, then a smaller cast, and a boot.  She did therapy for months.  She still has some swelling.  She is pain free at this point.  She had to use a scooter for months.  Denies vaginal bleeding.  Husband had Covid after bike week.  He had bilateral pneumonia.  He has long haul Covid now.  He had a 5.65m brain aneurysm.  He is having an interventional radiology procedure scheduled tomorrow.  Husband had incidental finding of chiari malformation.    No LMP recorded. Patient has had a hysterectomy.          Sexually active: Yes.    The current method of family planning is status post hysterectomy.    Exercising: yes, has started back  Smoker:  no  Health Maintenance: Pap:  Not indicated History of abnormal Pap:  no MMG:  Scheduled 11/11/2020 Colonoscopy:  cologuard ordered today BMD:   Not indicated TDaP:  2014 Pneumonia vaccine(s):  Not indicated Shingrix:   Not indicated Hep C testing: 10/02/2020 with Dr. HTrudie Reed rheumatology Screening Labs: CBC, CMP done 2/22, lipids and TSH done 2021 with me.   reports that she has never smoked. She has never used smokeless tobacco. She reports that she does not drink alcohol and does not use drugs.  Past Medical History:  Diagnosis Date  . Allergy    tx. occ. with OTC meds  . Arthritis    feet, back, hips  . Asthma    has been controlled, not rescue inhaler at present  . Carpal tunnel syndrome   . Complication of anesthesia    coming out of anesthesia with D & C-combative,not breathing, when allowed to come out slower, this was not an issue  . Depression   . Endometriosis of ovary    more resolved since hysterectomy  . GERD (gastroesophageal reflux disease)   . Headache(784.0)    hx. migraines, none in 5 yrs  . Heart murmur   . Hiatal hernia   . Hidradenitis  suppurativa    [facility]   . MRSA carrier   . Obesity   . Ovarian cyst    left   . Plantar fasciitis 2009  . PONV (postoperative nausea and vomiting)   . [place]iatic arthritis ([place]    [facility]     Past Surgical History:  Procedure Laterality Date  . ANKLE FRACTURE SURGERY Left 04/06/2020   hardware put in due to break  . CHOLECYSTECTOMY     laparoscopic  . DIAGNOSTIC LAPAROSCOPY  8/96   lysis/endometriosis (LKS)  . DILATION AND CURETTAGE OF UTERUS    . LAPAROSCOPIC GASTRIC SLEEVE RESECTION N/A 07/03/2013   Procedure: LAPAROSCOPIC GASTRIC SLEEVE RESECTION;  Surgeon: MPedro Earls MD;  [facility];  Service: General;  Laterality: N/A;  . LAPAROSCOPIC TOTAL HYSTERECTOMY  07/2010   robotic assisted  . SHOULDER SURGERY Left 04/04/2018   scar tissue   . TONSILLECTOMY    . UPPER GI ENDOSCOPY  07/03/2013   Procedure: UPPER GI ENDOSCOPY;  Surgeon: MPedro Earls MD;  [facility]: [facility];  Service: General;;    Current Outpatient Medications  Medication Sig Dispense Refill  . cetirizine (ZYRTEC) 10 MG tablet Take 10 mg by mouth daily.    .Marland Kitchen  HUMIRA PEN 40 MG/0.8ML PNKT     . Pediatric Multivit-Minerals-C (FLINTSTONES COMPLETE PO) Take 1 tablet by mouth at bedtime.     No current facility-administered medications for this visit.    Family History  Problem Relation Age of Onset  . Heart disease Mother   . Hyperlipidemia Mother   . Hypertension Mother   . Diabetes Maternal Grandmother   . Hypertension Maternal Grandmother   . Heart disease Maternal Grandmother   . Breast cancer Other        maternal great aunt  . Heart disease Maternal Grandfather     Review of Systems  All other systems reviewed and are negative.   Exam:   BP 129/84   Pulse 67   Ht 5' 3.5" (1.613 m)   Wt 187 lb 6.4 oz (85 kg)   SpO2 100%   BMI 32.68 kg/m   Height: 5' 3.5" (161.3 cm)  General  appearance: alert, cooperative and appears stated age Head: Normocephalic, without obvious abnormality, atraumatic Neck: no adenopathy, supple, symmetrical, trachea midline and thyroid normal to inspection and palpation Lungs: clear to auscultation bilaterally Breasts: normal appearance, no masses or tenderness Heart: regular rate and rhythm Abdomen: soft, non-tender; bowel sounds normal; no masses,  no organomegaly Extremities: extremities normal, atraumatic, no cyanosis or edema Skin: Skin color, texture, turgor normal. No rashes or lesions Lymph nodes: Cervical, supraclavicular, and axillary nodes normal. No abnormal inguinal nodes palpated Neurologic: Grossly normal   Pelvic: External genitalia:  no lesions              Urethra:  normal appearing urethra with no masses, tenderness or lesions              Bartholins and Skenes: normal                 Vagina: normal appearing vagina with normal color and discharge, no lesions              Cervix: absent              Pap taken: No. Bimanual Exam:  Uterus:  uterus absent              Adnexa: no mass, fullness, tenderness               Rectovaginal: Confirms               Anus:  normal sphincter tone, no lesions  Chaperone, Britt Bottom, CMA, was present for exam.  Assessment/Plan: 1. Well woman exam with routine gynecological exam - pap smear not indicated - MMG scheduled - cologuard ordered - BMD not indicated - vaccines reviewed  2. Colon cancer screening - Cologuard  3. Encounter for screening for HIV - HIV Antibody (routine testing w rflx); Future  4. Vitamin D deficiency - VITAMIN D 25 Hydroxy (Vit-D Deficiency, Fractures); Future  5. Psoriatic arthritis ([place])  6. Hidradenitis suppurativa  7. History of hysterectomy

## 2020-11-11 ENCOUNTER — Other Ambulatory Visit: Payer: Self-pay

## 2020-11-11 ENCOUNTER — Ambulatory Visit
Admission: RE | Admit: 2020-11-11 | Discharge: 2020-11-11 | Disposition: A | Discharge status: Home / Self Care | Payer: 59 | Source: Ambulatory Visit | Attending: Obstetrics & Gynecology | Admitting: Obstetrics & Gynecology

## 2020-11-11 DIAGNOSIS — Z1231 Encounter for screening mammogram for malignant neoplasm of breast: Secondary | ICD-10-CM

## 2020-11-13 LAB — COLOGUARD: Cologuard: NEGATIVE

## 2020-11-18 LAB — EXTERNAL GENERIC LAB PROCEDURE: COLOGUARD: NEGATIVE

## 2020-11-25 ENCOUNTER — Encounter (HOSPITAL_BASED_OUTPATIENT_CLINIC_OR_DEPARTMENT_OTHER): Payer: Self-pay

## 2020-12-04 ENCOUNTER — Ambulatory Visit: Payer: 59

## 2021-10-01 ENCOUNTER — Other Ambulatory Visit: Payer: Self-pay | Admitting: Obstetrics & Gynecology

## 2021-10-01 DIAGNOSIS — Z1231 Encounter for screening mammogram for malignant neoplasm of breast: Secondary | ICD-10-CM

## 2021-11-02 ENCOUNTER — Other Ambulatory Visit: Payer: Self-pay | Admitting: Orthopedic Surgery

## 2021-11-02 DIAGNOSIS — M25572 Pain in left ankle and joints of left foot: Secondary | ICD-10-CM

## 2021-11-11 ENCOUNTER — Ambulatory Visit
Admission: RE | Admit: 2021-11-11 | Discharge: 2021-11-11 | Disposition: A | Discharge status: Home / Self Care | Payer: 59 | Source: Ambulatory Visit | Attending: Obstetrics & Gynecology | Admitting: Obstetrics & Gynecology

## 2021-11-11 DIAGNOSIS — Z1231 Encounter for screening mammogram for malignant neoplasm of breast: Secondary | ICD-10-CM

## 2021-11-18 ENCOUNTER — Encounter (HOSPITAL_BASED_OUTPATIENT_CLINIC_OR_DEPARTMENT_OTHER): Payer: Self-pay

## 2021-11-18 ENCOUNTER — Ambulatory Visit (HOSPITAL_BASED_OUTPATIENT_CLINIC_OR_DEPARTMENT_OTHER): Payer: 59 | Admitting: Obstetrics & Gynecology

## 2021-11-24 ENCOUNTER — Encounter (HOSPITAL_BASED_OUTPATIENT_CLINIC_OR_DEPARTMENT_OTHER): Payer: Self-pay | Admitting: Obstetrics & Gynecology

## 2021-11-24 ENCOUNTER — Ambulatory Visit
Admission: RE | Admit: 2021-11-24 | Discharge: 2021-11-24 | Disposition: A | Discharge status: Home / Self Care | Payer: 59 | Source: Ambulatory Visit | Attending: Orthopedic Surgery | Admitting: Orthopedic Surgery

## 2021-11-24 ENCOUNTER — Other Ambulatory Visit: Payer: 59

## 2021-11-24 ENCOUNTER — Ambulatory Visit (INDEPENDENT_AMBULATORY_CARE_PROVIDER_SITE_OTHER): Payer: 59 | Admitting: Obstetrics & Gynecology

## 2021-11-24 VITALS — BP 124/81 | HR 71 | Ht 65.0 in | Wt 198.0 lb

## 2021-11-24 DIAGNOSIS — M25572 Pain in left ankle and joints of left foot: Secondary | ICD-10-CM

## 2021-11-24 DIAGNOSIS — L4050 Arthropathic psoriasis, unspecified: Secondary | ICD-10-CM | POA: Diagnosis not present

## 2021-11-24 DIAGNOSIS — Z90710 Acquired absence of both cervix and uterus: Secondary | ICD-10-CM

## 2021-11-24 DIAGNOSIS — R5383 Other fatigue: Secondary | ICD-10-CM | POA: Diagnosis not present

## 2021-11-24 DIAGNOSIS — Z01419 Encounter for gynecological examination (general) (routine) without abnormal findings: Secondary | ICD-10-CM

## 2021-11-24 DIAGNOSIS — L732 Hidradenitis suppurativa: Secondary | ICD-10-CM

## 2021-11-24 DIAGNOSIS — Z9884 Bariatric surgery status: Secondary | ICD-10-CM

## 2021-11-24 NOTE — Progress Notes (Signed)
46 y.o. G3P2 Married White or Caucasian female here for annual exam.  There have been a lot of family stressors with spouse and father in law this last year.  Husband is better but still this has been hard.  Father in law committed suicide in his late 52's.  This seems to have occurred due to issues with elevated CO2 levels.  Mother in law now with dementia symptoms possibly exacerbated by father in [place] death.  Also a friend of her son's committed suicide as well.  Has just been stressful.  Having some concerns with her own mother and blood disorder.  Pt, naturally, having fatigue but would like some evaluation. ? ?An upcoming joy is her son is getting married.   ? ?Her left ankle still has a broken bone in it.  She had a CT done today.  Still having swelling in her ankle with pain.   ? ?Denies vaginal bleeding.   ? ?No LMP recorded. Patient has had a hysterectomy.          ?Sexually active: Yes.    ?Exercising: not much because of ankle ?Smoker:  no ? ?Health Maintenance: ?Pap:  02/24/2017 Negative ?History of abnormal Pap:  no ?MMG:  11/11/2021 Negative ?Colonoscopy:  cologuard negative 11/13/2020 ?Screening Labs: ordered ? ? reports that she has never smoked. She has never used smokeless tobacco. She reports that she does not drink alcohol and does not use drugs. ? ?Past Medical History:  ?Diagnosis Date  ? Allergy   ? tx. occ. with OTC meds  ? Arthritis   ? feet, back, hips  ? Asthma   ? has been controlled, not rescue inhaler at present  ? Carpal tunnel syndrome   ? Complication of anesthesia   ? coming out of anesthesia with D & C-combative,not breathing, when allowed to come out slower, this was not an issue  ? Depression   ? Endometriosis of ovary   ? more resolved since hysterectomy  ? GERD (gastroesophageal reflux disease)   ? Headache(784.0)   ? hx. migraines, none in 5 yrs  ? Heart murmur   ? Hiatal hernia   ? Hidradenitis suppurativa   ? followed by [facility]   ?  MRSA carrier   ? Obesity   ? Plantar fasciitis 2009  ? PONV (postoperative nausea and vomiting)   ? Psoriatic arthritis ([place])   ? followed by Dr. Berna Bue at [facility]   ? ? ?Past Surgical History:  ?Procedure Laterality Date  ? ANKLE FRACTURE SURGERY Left 04/06/2020  ? hardware put in due to break  ? CHOLECYSTECTOMY    ? laparoscopic  ? DIAGNOSTIC LAPAROSCOPY  8/96  ? lysis/endometriosis (LKS)  ? DILATION AND CURETTAGE OF UTERUS    ? LAPAROSCOPIC GASTRIC SLEEVE RESECTION N/A 07/03/2013  ? Procedure: LAPAROSCOPIC GASTRIC SLEEVE RESECTION;  Surgeon: Pedro Earls, MD;  Location: [facility];  Service: General;  Laterality: N/A;  ? LAPAROSCOPIC TOTAL HYSTERECTOMY  07/2010  ? robotic assisted  ? SHOULDER SURGERY Left 04/04/2018  ? scar tissue   ? TONSILLECTOMY    ? UPPER GI ENDOSCOPY  07/03/2013  ? Procedure: UPPER GI ENDOSCOPY;  Surgeon: Pedro Earls, MD;  Location: [facility];  Service: General;;  ? ? ?Current Outpatient Medications  ?Medication Sig Dispense Refill  ? cetirizine (ZYRTEC) 10 MG tablet Take 10 mg by mouth daily.    ? HUMIRA PEN 40 MG/0.8ML PNKT     ? Pediatric Multivit-Minerals-C [facility] COMPLETE  PO) Take 1 tablet by mouth at bedtime.    ? ?No current facility-administered medications for this visit.  ? ? ?Family History  ?Problem Relation Age of Onset  ? Heart disease Mother   ? Hyperlipidemia Mother   ? Hypertension Mother   ? Diabetes Maternal Grandmother   ? Hypertension Maternal Grandmother   ? Heart disease Maternal Grandmother   ? Breast cancer Other   ?     maternal great aunt  ? Heart disease Maternal Grandfather   ? ? ?Review of Systems  ?All other systems reviewed and are negative. ? ?Exam:   ?BP 124/81 (BP Location: Right Arm, Patient Position: Sitting, Cuff Size: Large)   Pulse 71   Ht 5' 5"  (1.651 m) Comment: reported  Wt 198 lb (89.8 kg)   BMI 32.95 kg/m?   Height: 5' 5"  (165.1 cm) (reported) ? ?General appearance: alert, cooperative and appears stated age ?Head:  Normocephalic, without obvious abnormality, atraumatic ?Neck: no adenopathy, supple, symmetrical, trachea midline and thyroid normal to inspection and palpation ?Lungs: clear to auscultation bilaterally ?Breasts: normal appearance, no masses or tenderness ?Heart: regular rate and rhythm ?Abdomen: soft, non-tender; bowel sounds normal; no masses,  no organomegaly ?Extremities: extremities normal, atraumatic, no cyanosis or edema ?Skin: Skin color, texture, turgor normal. No rashes or lesions ?Lymph nodes: Cervical, supraclavicular, and axillary nodes normal. ?No abnormal inguinal nodes palpated ?Neurologic: Grossly normal ? ? ?Pelvic: External genitalia:  no lesions ?             Urethra:  normal appearing urethra with no masses, tenderness or lesions ?             Bartholins and Skenes: normal    ?             Vagina: normal appearing vagina with normal color and no discharge, no lesions ?             Cervix: absent ?             Pap taken: No. ?Bimanual Exam:  Uterus:  uterus absent ?             Adnexa: no mass, fullness, tenderness ?              Rectovaginal: Confirms ?              Anus:  normal sphincter tone, no lesions ? ?Chaperone, Octaviano Batty, CMA, was present for exam. ? ?Assessment/Plan: ?1. Well woman exam with routine gynecological exam ?- Pap smear not indicated ?- Mammogram 11/11/2021 ?- Colonoscopy has been declined but cologuard was negative 09/2020.  Repeat in 2024. ?- Bone mineral density not indicated at this time ?- vaccines reviewed/updated ? ?2. Fatigue, unspecified type ?- CBC with Differential/Platelet ?- Iron, TIBC and Ferritin Panel ?- B12 ?- VITAMIN D 25 Hydroxy (Vit-D Deficiency, Fractures) ? ?3. Psoriatic arthritis ([place]) ?- on Humira ? ?4. History of hysterectomy ? ?5. Hidradenitis suppurativa ? ?6. History of bariatric surgery ? ?

## 2021-11-25 LAB — CBC WITH DIFFERENTIAL/PLATELET
Basophils Absolute: 0 10*3/uL (ref 0.0–0.2)
Basos: 1 %
EOS (ABSOLUTE): 0.4 10*3/uL (ref 0.0–0.4)
Eos: 4 %
Hematocrit: 41 % (ref 34.0–46.6)
Hemoglobin: 14 g/dL (ref 11.1–15.9)
Immature Grans (Abs): 0 10*3/uL (ref 0.0–0.1)
Immature Granulocytes: 0 %
Lymphocytes Absolute: 4 10*3/uL — ABNORMAL HIGH (ref 0.7–3.1)
Lymphs: 49 %
MCH: 30.4 pg (ref 26.6–33.0)
MCHC: 34.1 g/dL (ref 31.5–35.7)
MCV: 89 fL (ref 79–97)
Monocytes Absolute: 0.7 10*3/uL (ref 0.1–0.9)
Monocytes: 9 %
Neutrophils Absolute: 2.9 10*3/uL (ref 1.4–7.0)
Neutrophils: 37 %
Platelets: 427 10*3/uL (ref 150–450)
RBC: 4.61 x10E6/uL (ref 3.77–5.28)
RDW: 11.8 % (ref 11.7–15.4)
WBC: 8 10*3/uL (ref 3.4–10.8)

## 2021-11-25 LAB — IRON,TIBC AND FERRITIN PANEL
Ferritin: 69 ng/mL (ref 15–150)
Iron Saturation: 22 % (ref 15–55)
Iron: 73 ug/dL (ref 27–159)
Total Iron Binding Capacity: 329 ug/dL (ref 250–450)
UIBC: 256 ug/dL (ref 131–425)

## 2021-11-25 LAB — VITAMIN D 25 HYDROXY (VIT D DEFICIENCY, FRACTURES): Vit D, 25-Hydroxy: 18 ng/mL — ABNORMAL LOW (ref 30.0–100.0)

## 2021-11-25 LAB — VITAMIN B12: Vitamin B-12: 136 pg/mL — ABNORMAL LOW (ref 232–1245)

## 2021-12-07 ENCOUNTER — Encounter (HOSPITAL_BASED_OUTPATIENT_CLINIC_OR_DEPARTMENT_OTHER): Payer: Self-pay | Admitting: Obstetrics & Gynecology

## 2021-12-21 ENCOUNTER — Ambulatory Visit
Admission: RE | Admit: 2021-12-21 | Discharge: 2021-12-21 | Disposition: A | Discharge status: Home / Self Care | Payer: 59 | Source: Ambulatory Visit | Attending: Family Medicine | Admitting: Family Medicine

## 2021-12-21 VITALS — BP 132/92 | HR 69 | Temp 98.5°F | Resp 18

## 2021-12-21 DIAGNOSIS — J0100 Acute maxillary sinusitis, unspecified: Secondary | ICD-10-CM

## 2021-12-21 DIAGNOSIS — J209 Acute bronchitis, unspecified: Secondary | ICD-10-CM

## 2021-12-21 DIAGNOSIS — J3089 Other allergic rhinitis: Secondary | ICD-10-CM

## 2021-12-21 MED ORDER — AMOXICILLIN-POT CLAVULANATE 875-125 MG PO TABS: 1.0000 | ORAL_TABLET | Freq: Two times a day (BID) | ORAL | 0 refills | Status: DC

## 2021-12-21 MED ORDER — PREDNISONE 20 MG PO TABS: 40.0000 mg | ORAL_TABLET | Freq: Every day | ORAL | 0 refills | Status: DC

## 2021-12-21 NOTE — ED Triage Notes (Signed)
Pt states she has had nasal congestion with clear mucus for the past 6 weeks  ? ?Pt states the congestion has moved to her chest and she has some wheezing and coughing ? ?Pt states she had a fever of 100.3 yesterday ? ?Pt states she tried zyrtec, allegra and OTC meds without any relief ?

## 2021-12-21 NOTE — ED Provider Notes (Signed)
?[place] ? ? ? ?CSN: [id number] ?Arrival date & time: 12/21/21  1545 ? ? ?  ? ?History   ?Chief Complaint ?Chief Complaint  ?Patient presents with  ? Nasal Congestion  ?  Week 6 of nasal congestion now moving into chest. - Entered by patient  ? ? ?HPI ?Tenley Ady Heimann is a 46 y.o. female.  ? ?Presenting today with about 6 weeks of nasal congestion, sinus pressure, mild cough that has progressed over the past week to intermittent fevers, wheezing, mild shortness of breath, worsening facial pain and pressure.  Denies chest pain, abdominal pain, nausea vomiting or diarrhea.  History of seasonal allergies on twice daily antihistamines, also taking over-the-counter nasal sprays, cold and congestion medications and cough syrups with minimal relief. ? ? ?Past Medical History:  ?Diagnosis Date  ? Allergy   ? tx. occ. with OTC meds  ? Arthritis   ? feet, back, hips  ? Asthma   ? has been controlled, not rescue inhaler at present  ? Carpal tunnel syndrome   ? Complication of anesthesia   ? coming out of anesthesia with D & C-combative,not breathing, when allowed to come out slower, this was not an issue  ? Depression   ? Endometriosis of ovary   ? more resolved since hysterectomy  ? GERD (gastroesophageal reflux disease)   ? Headache(784.0)   ? hx. migraines, none in 5 yrs  ? Heart murmur   ? Hiatal hernia   ? Hidradenitis suppurativa   ? followed by [facility]   ? MRSA carrier   ? Obesity   ? Plantar fasciitis 2009  ? PONV (postoperative nausea and vomiting)   ? Psoriatic arthritis ([place])   ? followed by Dr. Berna Bue at [facility]   ? ? ?Patient Active Problem List  ? Diagnosis Date Noted  ? Vitamin D deficiency 10/28/2020  ? History of hysterectomy 10/28/2020  ? Psoriatic arthritis ([place])   ? Hidradenitis suppurativa 02/24/2017  ? 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 GERD (gastroesophageal reflux disease)  04/25/2013  ? ? ?Past Surgical History:  ?Procedure Laterality Date  ? ANKLE FRACTURE SURGERY Left 04/06/2020  ? hardware put in due to break  ? CHOLECYSTECTOMY    ? laparoscopic  ? DIAGNOSTIC LAPAROSCOPY  8/96  ? lysis/endometriosis ([facility])  ? DILATION AND CURETTAGE OF UTERUS    ? LAPAROSCOPIC GASTRIC SLEEVE RESECTION N/A 07/03/2013  ? Procedure: LAPAROSCOPIC GASTRIC SLEEVE RESECTION;  Surgeon: Pedro Earls, MD;  Location: [facility];  Service: General;  Laterality: N/A;  ? LAPAROSCOPIC TOTAL HYSTERECTOMY  07/2010  ? robotic assisted  ? SHOULDER SURGERY Left 04/04/2018  ? scar tissue   ? TONSILLECTOMY    ? UPPER GI ENDOSCOPY  07/03/2013  ? Procedure: UPPER GI ENDOSCOPY;  Surgeon: Pedro Earls, MD;  Location: [facility];  Service: General;;  ? ? ?OB History   ? ? Gravida  ?3  ? Para  ?2  ? Term  ?   ? Preterm  ?   ? AB  ?   ? Living  ?2  ?  ? ? SAB  ?   ? IAB  ?   ? Ectopic  ?   ? Multiple  ?   ? Live Births  ?   ?   ?  ?  ? ? ? ?Home Medications   ? ?Prior to Admission medications   ?Medication Sig Start Date End Date Taking?  Authorizing Provider  ?amoxicillin-clavulanate (AUGMENTIN) 875-125 MG tablet Take 1 tablet by mouth every 12 (twelve) hours. 12/21/21  Yes Volney American, PA-C  ?predniSONE (DELTASONE) 20 MG tablet Take 2 tablets (40 mg total) by mouth daily with breakfast. 12/21/21  Yes Volney American, PA-C  ?cetirizine (ZYRTEC) 10 MG tablet Take 10 mg by mouth daily.    [provider]  ?HUMIRA PEN 40 MG/0.8ML PNKT  02/11/17   [provider]  ?Pediatric Multivit-Minerals-C (FLINTSTONES COMPLETE PO) Take 1 tablet by mouth at bedtime.    [provider]  ? ? ?Family History ?Family History  ?Problem Relation Age of Onset  ? Heart disease Mother   ? Hyperlipidemia Mother   ? Hypertension Mother   ? Diabetes Maternal Grandmother   ? Hypertension Maternal Grandmother   ? Heart disease Maternal Grandmother   ? Breast cancer Other   ?     maternal great aunt  ? Heart disease  Maternal Grandfather   ? ? ?Social History ?Social History  ? ?Tobacco Use  ? Smoking status: Never  ?  Passive exposure: Never  ? Smokeless tobacco: Never  ?Vaping Use  ? Vaping Use: Never used  ?Substance Use Topics  ? Alcohol use: No  ?  Alcohol/week: 0.0 standard drinks  ? Drug use: No  ? ? ? ?Allergies   ?Iodinated contrast media, Lactose intolerance (gi), Shellfish allergy, Sulfa antibiotics, Whey protein [protein], Erythromycin, and Sudafed [pseudoephedrine] ? ? ?Review of Systems ?Review of Systems ?Per HPI ? ?Physical Exam ?Triage Vital Signs ?ED Triage Vitals  ?Enc Vitals Group  ?   BP 12/21/21 1610 (!) 132/92  ?   Pulse Rate 12/21/21 1610 69  ?   Resp 12/21/21 1610 18  ?   Temp 12/21/21 1610 98.5 ?F (36.9 ?C)  ?   Temp Source 12/21/21 1610 Oral  ?   SpO2 12/21/21 1610 94 %  ?   Weight --   ?   Height --   ?   Head Circumference --   ?   Peak Flow --   ?   Pain Score 12/21/21 1608 5  ?   Pain Loc --   ?   Pain Edu? --   ?   Excl. in [place]? --   ? ?No data found. ? ?Updated Vital Signs ?BP (!) 132/92 (BP Location: Right Arm)   Pulse 69   Temp 98.5 ?F (36.9 ?C) (Oral)   Resp 18   SpO2 94%  ? ?Visual Acuity ?Right Eye Distance:   ?Left Eye Distance:   ?Bilateral Distance:   ? ?Right Eye Near:   ?Left Eye Near:    ?Bilateral Near:    ? ?Physical Exam ?Vitals and nursing note reviewed.  ?Constitutional:   ?   Appearance: Normal appearance.  ?HENT:  ?   Head: Atraumatic.  ?   Right Ear: Tympanic membrane and external ear normal.  ?   Left Ear: Tympanic membrane and external ear normal.  ?   Nose: Congestion present.  ?   Mouth/Throat:  ?   Mouth: Mucous membranes are moist.  ?   Pharynx: Posterior oropharyngeal erythema present.  ?Eyes:  ?   Extraocular Movements: Extraocular movements intact.  ?   Conjunctiva/sclera: Conjunctivae normal.  ?Cardiovascular:  ?   Rate and Rhythm: Normal rate and regular rhythm.  ?   Heart sounds: Normal heart sounds.  ?Pulmonary:  ?   Effort: Pulmonary effort is normal.  ?  Breath sounds: Wheezing present.  ?   Comments: Wheezes worse on the right lower ?Musculoskeletal:     ?   General: Normal range of motion.  ?   Cervical back: Normal range of motion and neck supple.  ?Skin: ?   General: Skin is warm and dry.  ?Neurological:  ?   Mental Status: She is alert and oriented to person, place, and time.  ?Psychiatric:     ?   Mood and Affect: Mood normal.     ?   Thought Content: Thought content normal.  ? ? ? ?UC Treatments / Results  ?Labs ?(all labs ordered are listed, but only abnormal results are displayed) ?Labs Reviewed - No data to display ? ?EKG ? ? ?Radiology ?No results found. ? ?Procedures ?Procedures (including critical care time) ? ?Medications Ordered in UC ?Medications - No data to display ? ?Initial Impression / Assessment and Plan / UC Course  ?I have reviewed the triage vital signs and the nursing notes. ? ?Pertinent labs & imaging results that were available during my care of the patient were reviewed by me and considered in my medical decision making (see chart for details). ? ?  ? ?Treat with prednisone, Augmentin, continued allergy and congestion regimen.  Return for acutely worsening symptoms. ? ?Final Clinical Impressions(s) / UC Diagnoses  ? ?Final diagnoses:  ?Acute maxillary sinusitis, recurrence not specified  ?Seasonal allergic rhinitis due to other allergic trigger  ?Acute bronchitis, unspecified organism  ? ?Discharge Instructions   ?None ?  ? ?ED Prescriptions   ? ? Medication Sig Dispense Auth. Provider  ? predniSONE (DELTASONE) 20 MG tablet Take 2 tablets (40 mg total) by mouth daily with breakfast. 10 tablet Volney American, PA-C  ? amoxicillin-clavulanate (AUGMENTIN) 875-125 MG tablet Take 1 tablet by mouth every 12 (twelve) hours. 20 tablet Volney American, [place]  ? ?  ? ?PDMP not reviewed this encounter. ?  ?Volney American, PA-C ?12/21/21 1653 ? ?

## 2021-12-23 ENCOUNTER — Encounter: Payer: Self-pay | Admitting: Allergy & Immunology

## 2021-12-23 ENCOUNTER — Ambulatory Visit (INDEPENDENT_AMBULATORY_CARE_PROVIDER_SITE_OTHER): Payer: 59 | Admitting: Allergy & Immunology

## 2021-12-23 ENCOUNTER — Other Ambulatory Visit: Payer: Self-pay

## 2021-12-23 VITALS — BP 126/84 | HR 76 | Temp 98.2°F | Resp 16 | Ht 64.0 in | Wt 199.4 lb

## 2021-12-23 DIAGNOSIS — J310 Chronic rhinitis: Secondary | ICD-10-CM | POA: Diagnosis not present

## 2021-12-23 DIAGNOSIS — J4520 Mild intermittent asthma, uncomplicated: Secondary | ICD-10-CM | POA: Diagnosis not present

## 2021-12-23 DIAGNOSIS — T781XXA Other adverse food reactions, not elsewhere classified, initial encounter: Secondary | ICD-10-CM | POA: Diagnosis not present

## 2021-12-23 DIAGNOSIS — K9049 Malabsorption due to intolerance, not elsewhere classified: Secondary | ICD-10-CM

## 2021-12-23 NOTE — Progress Notes (Signed)
? ?NEW PATIENT ? ?Date of Service/Encounter:  12/23/21 ? ?Consult requested by: Gaynelle Arabian, MD ? ? ?Assessment:  ? ?Mild intermittent asthma, uncomplicated ? ?Non-allergic rhinitis ? ?Food intolerance - with negative testing (not interested in challenges) ? ?Psoriasis and hidradenitis suppurativa - on Humira for this ? ?Plan/Recommendations:  ? ?1. Non-allergic rhinitis ?- Testing today showed: NEGATIVE TO THE ENTIRE PANEL (ever so slightly positive to an outdoor mold mix) ?- Copy of test results provided.  ?- Avoidance measures provided. ?- Stop taking: all of your current medications ?- Start taking: Xyzal (levocetirizine) 35m tablet once daily, Singulair (montelukast) 175mdaily, and Xhance (fluticasone) 1-2 sprays per nostril daily  ?- You can use an extra dose of the antihistamine, if needed, for breakthrough symptoms.  ?- Consider nasal saline rinses 1-2 times daily to remove allergens from the nasal cavities as well as help with mucous clearance (this is especially helpful to do before the nasal sprays are given) ? ?2. Food intolerance ?- Testing was negative to everything we tested. ?- Blood work declined. ?- I would just continue with avoidance of seafood as well as milk products. ?- You seem to have a good handle on these symptoms.  ? ?3. Mild intermittent asthma, uncomplicated ?- We are going to send in albuterol to use 4 puffs every 4-6 hours as needed. ?- I do not think that we need a controller medication at this time. ? ?4. Return in about 3 months (around 03/25/2022). Thank you for teaching me so much about rabbits!  ? ?This note in its entirety was forwarded to the Provider who requested this consultation. ? ?Subjective:  ? ?Leeann CrTakyla Kucheras a [age].o. female presenting today for evaluation of  ?Chief Complaint  ?Patient presents with  ? Allergic Rhinitis   ?  Has been 6-7 weeks of sinus infection and bronchitis - clear mucus, wheezing. She just got bunnies and the timothy hay causes her  symptoms. Many years ago was on allergy [place] where she was able to stop and maintain allergies with Claritin/ other antihistamines.   ? Asthma  ?  Had asthma in the past,  is not on any inhalers had to use her mother's albuterol inhaler for wheezing one time due to allergies   ? Other  ?  Currently on augment three days into the 10 day prescription   ? ? ?Idali CrJalah Warmuthas a history of the following: ?Patient Active Problem List  ? Diagnosis Date Noted  ? Vitamin D deficiency 10/28/2020  ? History of hysterectomy 10/28/2020  ? Psoriatic arthritis ([place]  ? Hidradenitis suppurativa 02/24/2017  ? 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 GERD (gastroesophageal reflux disease) 04/25/2013  ? ? ?History obtained from: chart review and patient. ? ?Naama CrCordella Registeras referred by EhGaynelle ArabianMD.    ? ?Glendi is a [age].o. female presenting for an evaluation of environmental allergies as well as asthma . ?  ?Asthma/Respiratory Symptom History: She has been using dextromethorphan containing cough medications.   She has also been having some wheezing with exposure to her new bunnies. She has 6 of them and she notices that the grass that the eat makes her symptoms worse. She used her mother's albuterol and that helped a lot.  ? ?Allergic Rhinitis Symptom History: She has been having 7 weeks of rhinorrhea with clear discharge. She has been sneezing and has had some watery eyes. She finally got to  a breaking point and went to see her PCP she she was diagnosed a sinus infection and bronchitis.  She has bene using Claritin and Zyrtec and Allegra. She started the Allegra which seemed to "push things down". She sounded worse on Allegra. She also started on chlortrimeton. She did have allergy shots for through [facility] around 10-15 years ago.  ? ?Food Allergy Symptom History: She is allergic to dairy and shellfish. She avoids milk  in all forms.  She can eat baked milk without a problem. ? ?Skin Symptom History: She has a history of psoriasis and is on Humira. She hiadrenitis suppuritva.  This is all prescribed by Dr. Lenna Gilford at [facility].  ? ?Otherwise, there is no history of other atopic diseases, including stinging insect allergies, urticaria, or contact dermatitis. There is no significant infectious history. Vaccinations are up to date.  ? ? ?Past Medical History: ?Patient Active Problem List  ? Diagnosis Date Noted  ? Vitamin D deficiency 10/28/2020  ? History of hysterectomy 10/28/2020  ? Psoriatic arthritis ([place])   ? Hidradenitis suppurativa 02/24/2017  ? 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 GERD (gastroesophageal reflux disease) 04/25/2013  ? ? ?Medication List:  ?Allergies as of 12/23/2021   ? ?   Reactions  ? Iodinated Contrast Media Hives  ? Lactose Intolerance (gi) Other (See Comments)  ? Severe abdominal pain and vomiting  ? Shellfish Allergy Nausea And Vomiting  ? Sulfa Antibiotics Hives  ? Whey Protein [protein] Other (See Comments)  ? Severe abdominal pain and vomiting  ? Erythromycin Rash  ? Sudafed [pseudoephedrine] Palpitations  ? ?  ? ?  ?Medication List  ?  ? ?  ? Accurate as of Dec 23, 2021 11:59 PM. If you have any questions, ask your nurse or doctor.  ?  ?  ? ?  ? ?amoxicillin-clavulanate 875-125 MG tablet ?Commonly known as: AUGMENTIN ?Take 1 tablet by mouth every 12 (twelve) hours. ?  ?cetirizine 10 MG tablet ?Commonly known as: ZYRTEC ?Take 10 mg by mouth daily. ?  ?FLINTSTONES COMPLETE PO ?Take 1 tablet by mouth at bedtime. ?  ?Humira Pen 40 MG/0.8ML Pnkt ?Generic drug: Adalimumab ?  ?predniSONE 20 MG tablet ?Commonly known as: DELTASONE ?Take 2 tablets (40 mg total) by mouth daily with breakfast. ?  ? ?  ? ? ?Birth History: non-contributory ? ?Developmental History: non-contributory ? ?Past Surgical History: ?Past Surgical History:  ?Procedure Laterality Date  ? ANKLE  FRACTURE SURGERY Left 04/06/2020  ? hardware put in due to break  ? CHOLECYSTECTOMY    ? laparoscopic  ? DIAGNOSTIC LAPAROSCOPY  8/96  ? lysis/endometriosis ([facility])  ? DILATION AND CURETTAGE OF UTERUS    ? LAPAROSCOPIC GASTRIC SLEEVE RESECTION N/A 07/03/2013  ? Procedure: LAPAROSCOPIC GASTRIC SLEEVE RESECTION;  Surgeon: Pedro Earls, MD;  Location: [facility];  Service: General;  Laterality: N/A;  ? LAPAROSCOPIC TOTAL HYSTERECTOMY  07/2010  ? robotic assisted  ? SHOULDER SURGERY Left 04/04/2018  ? scar tissue   ? TONSILLECTOMY    ? UPPER GI ENDOSCOPY  07/03/2013  ? Procedure: UPPER GI ENDOSCOPY;  Surgeon: Pedro Earls, MD;  Location: [facility];  Service: General;;  ? ? ? ?Family History: ?Family History  ?Problem Relation Age of Onset  ? Heart disease Mother   ? Hyperlipidemia Mother   ? Hypertension Mother   ? Diabetes Maternal Grandmother   ? Hypertension Maternal Grandmother   ? Heart disease Maternal Grandmother   ?  Breast cancer Other   ?     maternal great aunt  ? Heart disease Maternal Grandfather   ? ? ? ?Social History: Makell lives at home with her family. She lives in a house that was built in the 1990s. There eis wood flooring throughout the home.  They have gas and electric heating and central cooling.  There are dogs and bunnies inside of the home.  Interesting fact, the gestation of a rabbit is 30 days.  This started up at 2 rabbits and then ended up with 6 or 10 or something.  Anyway, they do have dust mite covers on the bed, but not the pillows.  There is no tobacco exposure.  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  She is not exposed to fumes, chemicals, or dust.  She does use a HEPA filter.  She does not live near an interstate or industrial area.  There is no tobacco exposure whatsoever. ? ? ?Review of Systems  ?Constitutional: Negative.  Negative for chills, fever, malaise/fatigue and weight loss.  ?HENT:  Positive for congestion.  Negative for ear discharge and ear pain.   ?     Positive for postnasal drip.  ?Eyes:  Negative for pain, discharge and redness.  ?Respiratory:  Positive for cough. Negative for sputum production, shor

## 2021-12-23 NOTE — Patient Instructions (Addendum)
1. Non-allergic rhinitis ?- Testing today showed: NEGATIVE TO THE ENTIRE PANEL (ever so slightly positive to an outdoor mold mix) ?- Copy of test results provided.  ?- Avoidance measures provided. ?- Stop taking: all of your current medications ?- Start taking: Xyzal (levocetirizine) 82m tablet once daily, Singulair (montelukast) 141mdaily, and Xhance (fluticasone) 1-2 sprays per nostril daily  ?- You can use an extra dose of the antihistamine, if needed, for breakthrough symptoms.  ?- Consider nasal saline rinses 1-2 times daily to remove allergens from the nasal cavities as well as help with mucous clearance (this is especially helpful to do before the nasal sprays are given) ? ?2. Food intolerance ?- Testing was negative to everything we tested. ?- Blood work declined. ?- I would just continue with avoidance of seafood as well as milk products. ?- You seem to have a good handle on these symptoms.  ? ?3. Mild intermittent asthma, uncomplicated ?- We are going to send in albuterol to use 4 puffs every 4-6 hours as needed. ?- I do not think that we need a controller medication at this time. ? ?4. Return in about 3 months (around 03/25/2022). Thank you for teaching me so much about rabbits!  ? ? ?Please inform usKoreaf any Emergency Department visits, hospitalizations, or changes in symptoms. Call usKoreaefore going to the ED for breathing or allergy symptoms since we might be able to fit you in for a sick visit. Feel free to contact usKoreanytime with any questions, problems, or concerns. ? ?It was a pleasure to meet you today! ? ?Websites that have reliable patient information: ?1. American Academy of Asthma, Allergy, and Immunology: www.aaaai.org ?2. Food Allergy Research and Education (FARE): foodallergy.org ?3. Mothers of Asthmatics: http://www.asthmacommunitynetwork.org ?4. [employer]f Allergy, Asthma, and Immunology: wwMonthlyElectricBill.co.uk ? ?COVID-19 Vaccine Information can be found at:  [email or web address] questions related to vaccine distribution or appointments, please email [email or web address] or call [phone] ? ?We realize that you might be concerned about having an allergic reaction to the COVID19 vaccines. To help with that concern, WE ARE OFFERING THE COVID19 VACCINES IN OUR OFFICE! Ask the front desk for dates!  ? ? ? ??Like? usKorean Facebook and Instagram for our latest updates!  ?  ? ? ?A healthy democracy works best when ALNew York Life Insurancearticipate! Make sure you are registered to vote! If you have moved or changed any of your contact information, you will need to get this updated before voting! ? ?In some cases, you MAY be able to register to vote online: [email or web address] ? ? ? ?Rhinitis (Hayfever) Overview ? ?There are two types of rhinitis: allergic and non-allergic. ? ?Allergic Rhinitis ?If you have allergic rhinitis, your immune system mistakenly identifies a typically harmless substance as an intruder. This substance is called an allergen. The immune system responds to the allergen by releasing histamine and chemical mediators that typically cause symptoms in the nose, throat, eyes, ears, skin and roof of the mouth. ? ?Seasonal allergic rhinitis (hay fever) is most often caused by pollen carried in the air during different times of the year in different parts of the country. ? ?Allergic rhinitis can also be triggered by common indoor allergens such as the dried skin flakes, urine and saliva found on pet dander, mold, droppings from dust mites and cockroach particles. This is called perennial allergic rhinitis, as symptoms typically occur year-round. ? ?In addition to allergen triggers, symptoms may also occur from irritants such as smoke and  strong odors, or to changes in the temperature and humidity of the air. This happens because allergic rhinitis causes inflammation in the nasal  lining, which increases sensitivity to inhalants. ? ?Many people with allergic rhinitis are prone to allergic conjunctivitis (eye allergy). In addition, allergic rhinitis can make symptoms of asthma worse for people who suffer from both conditions. ? ?Nonallergic Rhinitis ?At least one out of three people with rhinitis symptoms do not have allergies. Nonallergic rhinitis usually afflicts adults and causes year-round symptoms, especially runny nose and nasal congestion. This condition differs from allergic rhinitis because the immune system is not involved. ? ? Airborne Adult Perc - 12/23/21 1429   ? ? Time Antigen Placed 0272   ? Allergen Manufacturer Lavella Hammock   ? Location Back   ? Number of Test 59   ? 1. Control-Buffer 50% Glycerol Negative   ? 2. Control-Histamine 1 mg/ml 2+   ? 3. Albumin saline Negative   ? 4. [place] Negative   ? 5. [place] Negative   ? 6. [place] Negative   ? 7. Bystrom Blue Negative   ? 8. Meadow Fescue Negative   ? 9. Perennial Rye Negative   ? 10. Sweet Vernal Negative   ? 11. Timothy Negative   ? 12. Cocklebur Negative   ? 13. Burweed Marshelder Negative   ? 14. Ragweed, short Negative   ? 15. Ragweed, Giant Negative   ? 16. Plantain,  English Negative   ? 17. Lamb's Quarters Negative   ? 18. Sheep Sorrell Negative   ? 19. Rough Pigweed Negative   ? 20. Marsh Elder, Rough Negative   ? 21. Mugwort, Common Negative   ? 22. Ash mix Negative   ? 23. Wendee Copp mix Negative   ? 24. Beech American Negative   ? 25. Box, Elder Negative   ? 26. Cedar, red Negative   ? 27. Cottonwood, [place] Negative   ? 28. Elm mix Negative   ? 29. Hickory Negative   ? 30. Maple mix Negative   ? 31. Oak, [place] mix Negative   ? 32. Pecan Pollen Negative   ? 33. Pine mix Negative   ? 34. Sycamore Eastern Negative   ? 35. Walnut, Black Pollen Negative   ? 36. Alternaria alternata Negative   ? 36. Cladosporium Herbarum Negative   ? 38. Aspergillus mix Negative   ? 39. Penicillium mix Negative   ? 40. Bipolaris sorokiniana  (Helminthosporium) Negative   ? 41. Drechslera spicifera (Curvularia) Negative   ? 42. Mucor plumbeus Negative   ? 43. Fusarium moniliforme Negative   ? 44. Aureobasidium pullulans (pullulara) Negative   ? 45. Rhizopus oryzae Negative   ? 46. Botrytis cinera Negative   ? 47. Epicoccum nigrum Negative   ? 48. Phoma betae Negative   ? 49. Candida Albicans Negative   ? 50. Trichophyton mentagrophytes Negative   ? 51. Mite, D Farinae  5,000 AU/ml Negative   ? 52. Mite, D Pteronyssinus  5,000 AU/ml Negative   ? 53. Cat Hair 10,000 BAU/ml Negative   ? 54.  Dog Epithelia Negative   ? 55. Mixed Feathers Negative   ? 56. Horse Epithelia Negative   ? 57. Cockroach, [place] Negative   ? 58. Mouse Negative   ? 59. Tobacco Leaf Negative   ? ?  ?  ? ?  ? ? Intradermal - 12/23/21 1447   ? ? Time Antigen Placed 5366   ? Allergen Manufacturer Lavella Hammock   ? Location  Arm   ? Number of Test 15   ? ?  ?  ? ?  ? ? Food Adult Perc - 12/23/21 1400   ? ? Time Antigen Placed 4982   ? Allergen Manufacturer Lavella Hammock   ? Location Back   ? 5. Milk, cow Negative   ? 7. Casein Negative   ? 8. Shellfish Mix Negative   ? 25. Shrimp Negative   ? 26. Crab Negative   ? 27. Lobster Negative   ? 28. Oyster Negative   ? 29. Scallops Negative   ? 3. Rabbit Negative   ? ?  ?  ? ?  ? ? ? ? ? ? ? ?

## 2021-12-24 ENCOUNTER — Telehealth: Payer: Self-pay

## 2021-12-24 ENCOUNTER — Encounter: Payer: Self-pay | Admitting: Allergy & Immunology

## 2021-12-24 MED ORDER — XHANCE 93 MCG/ACT NA EXHU: 1.0000 | INHALANT_SUSPENSION | Freq: Every day | NASAL | 5 refills | Status: DC

## 2021-12-24 MED ORDER — MONTELUKAST SODIUM 10 MG PO TABS: 10.0000 mg | ORAL_TABLET | Freq: Every day | ORAL | 5 refills | Status: DC

## 2021-12-24 MED ORDER — LEVOCETIRIZINE DIHYDROCHLORIDE 5 MG PO TABS: 5.0000 mg | ORAL_TABLET | Freq: Every evening | ORAL | 5 refills | Status: DC

## 2021-12-24 MED ORDER — ALBUTEROL SULFATE HFA 108 (90 BASE) MCG/ACT IN AERS: 2.0000 | INHALATION_SPRAY | Freq: Four times a day (QID) | RESPIRATORY_TRACT | 2 refills | Status: DC | PRN

## 2021-12-24 NOTE — Telephone Encounter (Signed)
Sent to Dr. Ernst Bowler since he saw her yesterday

## 2021-12-24 NOTE — Telephone Encounter (Signed)
Sorry they might have been put in, but I did not catch up on notes as I anticipated doing. Thanks!  ? ?Salvatore Marvel, MD ?Allergy and [place] [facility] ? ?

## 2021-12-24 NOTE — Telephone Encounter (Signed)
Patient was seen on yesterday and she didn't receive any of her refills.  ? ?Start taking: Xyzal (levocetirizine) 34m tablet once daily, Singulair (montelukast) 158mdaily, and Xhance (fluticasone) 1-2 sprays per nostril daily.  ?Patient also needs the albuterol inhaler sent in. ?  ?[id number] [place] ?

## 2021-12-24 NOTE — Telephone Encounter (Signed)
Patient called about her refills that she did not get yesterday. Per AVS, Xyzal, Singular, Xhance, and Albuterol have been sent into the pharmacy. ?

## 2021-12-28 ENCOUNTER — Telehealth (HOSPITAL_BASED_OUTPATIENT_CLINIC_OR_DEPARTMENT_OTHER): Payer: Self-pay

## 2021-12-28 NOTE — Telephone Encounter (Signed)
Patient called office about lab results  and recommendations. Patient is concerned about her vitamin d and vitamin b12 levels. She would also like her Citalopram increased to 38m. Please advise ?

## 2021-12-30 ENCOUNTER — Other Ambulatory Visit (HOSPITAL_BASED_OUTPATIENT_CLINIC_OR_DEPARTMENT_OTHER): Payer: Self-pay | Admitting: Obstetrics & Gynecology

## 2021-12-30 ENCOUNTER — Telehealth: Payer: Self-pay

## 2021-12-30 DIAGNOSIS — R4586 Emotional lability: Secondary | ICD-10-CM

## 2021-12-30 MED ORDER — CYANOCOBALAMIN 1000 MCG/ML IJ SOLN: 1000.0000 ug | Freq: Once | INTRAMUSCULAR | 11 refills | Status: AC

## 2021-12-30 NOTE — Telephone Encounter (Signed)
Pa submitted thru over my meds for xhance waiting on response form insurance  ?

## 2021-12-31 ENCOUNTER — Other Ambulatory Visit (HOSPITAL_BASED_OUTPATIENT_CLINIC_OR_DEPARTMENT_OTHER): Payer: 59

## 2021-12-31 DIAGNOSIS — R4586 Emotional lability: Secondary | ICD-10-CM

## 2021-12-31 NOTE — Addendum Note (Signed)
Addended by: Blenda Nicely on: 12/31/2021 01:32 PM ? ? Modules accepted: Orders ? ?

## 2022-01-01 LAB — FOLLICLE STIMULATING HORMONE: FSH: 71.7 m[IU]/mL

## 2022-01-01 LAB — ESTRADIOL: Estradiol: 9.9 pg/mL

## 2022-01-01 NOTE — Telephone Encounter (Signed)
PA approved and approval letter faxed to pharmacy/placed in bulk scanning.  ?

## 2022-01-02 ENCOUNTER — Encounter (HOSPITAL_BASED_OUTPATIENT_CLINIC_OR_DEPARTMENT_OTHER): Payer: Self-pay | Admitting: Obstetrics & Gynecology

## 2022-01-07 ENCOUNTER — Ambulatory Visit (INDEPENDENT_AMBULATORY_CARE_PROVIDER_SITE_OTHER): Payer: 59 | Admitting: Obstetrics & Gynecology

## 2022-01-07 ENCOUNTER — Encounter (HOSPITAL_BASED_OUTPATIENT_CLINIC_OR_DEPARTMENT_OTHER): Payer: Self-pay | Admitting: Obstetrics & Gynecology

## 2022-01-07 VITALS — BP 127/79 | HR 63 | Ht 64.0 in | Wt 199.4 lb

## 2022-01-07 DIAGNOSIS — N951 Menopausal and female climacteric states: Secondary | ICD-10-CM

## 2022-01-07 MED ORDER — ESTRADIOL 0.05 MG/24HR TD PTWK: 0.0500 mg | MEDICATED_PATCH | TRANSDERMAL | 2 refills | Status: DC

## 2022-01-10 DIAGNOSIS — N951 Menopausal and female climacteric states: Secondary | ICD-10-CM | POA: Insufficient documentation

## 2022-01-10 NOTE — Progress Notes (Signed)
GYNECOLOGY  VISIT  CC:   discuss lab results/options  HPI: 46 y.o. G3P2 Married White or Caucasian female here for discussion of lab results and treatment options.  [place] and estradiol were obtained on 12/31/2021 showing Walters of 71 and estradiol of 9.9.  Pt understands this is in menopausal range.  She's been having a variety of symptoms that she now thinks may be menopausal related.  She is having some night sweats now.  Has brain fog and emotional lability.  Does have Low Vit D and low B12.  She has started treatment for this.  HRT discussed.  She has hx of hysterectomy so does not need progesterone.  Dosing and routes of administration discussed.   Risks discussed including breast cancer, DVT/PE, stroke, MI.  As she will be on estrogen only dosing, most recent information regarding breast cancer does seem to show this risk is lower than initially estimated from the [facility].  Discussed this with pt today.   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 occ. with OTC meds   Arthritis    feet, back, hips   Asthma    has been controlled, not rescue inhaler at present   Carpal tunnel syndrome    Complication of anesthesia    coming out of anesthesia with D & C-combative,not breathing, when allowed to come out slower, this was not an issue   Depression    Endometriosis of ovary    more resolved since hysterectomy   GERD (gastroesophageal reflux disease)    Headache(784.0)    hx. migraines, none in 5 yrs   Heart murmur    Hiatal hernia    Hidradenitis suppurativa    followed by [facility]    MRSA carrier    Obesity     Plantar fasciitis 2009   PONV (postoperative nausea and vomiting)    Psoriatic arthritis ([place])    followed by Dr. Berna Bue at [facility]     Past Surgical History:  Procedure Laterality Date   ANKLE FRACTURE SURGERY Left 04/06/2020   hardware put in due to break   CHOLECYSTECTOMY     laparoscopic   DIAGNOSTIC LAPAROSCOPY  8/96   lysis/endometriosis ([facility])   DILATION AND CURETTAGE OF UTERUS     LAPAROSCOPIC GASTRIC SLEEVE RESECTION N/A 07/03/2013   Procedure: LAPAROSCOPIC GASTRIC SLEEVE RESECTION;  Surgeon: Pedro Earls, MD;  Location: [facility] ORS;  Service: General;  Laterality: N/A;   LAPAROSCOPIC TOTAL HYSTERECTOMY  07/2010   robotic assisted   SHOULDER SURGERY Left 04/04/2018   scar tissue    TONSILLECTOMY     UPPER GI ENDOSCOPY  07/03/2013   Procedure: UPPER GI ENDOSCOPY;  Surgeon: Pedro Earls, MD;  Location: [facility];  Service: General;;    MEDS:   Current Outpatient Medications on File Prior to Visit  Medication Sig Dispense Refill   albuterol (VENTOLIN HFA) 108 (90 Base) MCG/ACT inhaler Inhale 2 puffs into the lungs every 6 (six) hours as needed for wheezing or shortness of breath. 18 g 2   cetirizine (ZYRTEC) 10 MG tablet Take 10 mg by mouth daily.     Fluticasone Propionate (  XHANCE) 93 MCG/ACT EXHU Place 1-2 sprays into the nose daily. 16 mL 5   HUMIRA PEN 40 MG/0.8ML PNKT      levocetirizine (XYZAL) 5 MG tablet Take 1 tablet (5 mg total) by mouth every evening. 30 tablet 5   montelukast (SINGULAIR) 10 MG tablet Take 1 tablet (10 mg total) by mouth at bedtime. 30 tablet 5   Pediatric Multivit-Minerals-C (FLINTSTONES COMPLETE PO) Take 1 tablet by mouth at bedtime.     No current facility-administered medications on file prior to visit.    ALLERGIES: Iodinated contrast media, Lactose intolerance (gi), Shellfish allergy, Sulfa antibiotics, Whey protein [protein], Erythromycin, and Sudafed [pseudoephedrine]  Family History  Problem Relation Age of  Onset   Heart disease Mother    Hyperlipidemia Mother    Hypertension Mother    Diabetes Maternal Grandmother    Hypertension Maternal Grandmother    Heart disease Maternal Grandmother    Breast cancer Other        maternal great aunt   Heart disease Maternal Grandfather     SH:  married, non-smoker  Review of Systems  Constitutional: Negative.    PHYSICAL EXAMINATION:    BP 127/79 (BP Location: Right Arm, Patient Position: Sitting, Cuff Size: Large)   Pulse 63   Ht 5' 4"  (1.626 m) Comment: reported  Wt 199 lb 6.4 oz (90.4 kg)   BMI 34.23 kg/m     Physical Exam Constitutional:      Appearance: Normal appearance.  Neurological:     General: No focal deficit present.     Mental Status: She is alert.  Psychiatric:        Mood and Affect: Mood normal.     Assessment/Plan: 1. Menopausal symptoms - estradiol (CLIMARA - DOSED IN MG/24 HR) 0.05 mg/24hr patch; Place 1 patch (0.05 mg total) onto the skin once a week.  Dispense: 4 patch; Refill: 2  - she will give update in 3-4 weeks to see if dosage adjustment is needed

## 2022-01-14 ENCOUNTER — Ambulatory Visit (HOSPITAL_BASED_OUTPATIENT_CLINIC_OR_DEPARTMENT_OTHER): Payer: 59 | Admitting: Obstetrics & Gynecology

## 2022-01-25 ENCOUNTER — Ambulatory Visit: Payer: 59 | Admitting: Allergy & Immunology

## 2022-02-22 ENCOUNTER — Other Ambulatory Visit (HOSPITAL_BASED_OUTPATIENT_CLINIC_OR_DEPARTMENT_OTHER): Payer: Self-pay | Admitting: Obstetrics & Gynecology

## 2022-02-22 ENCOUNTER — Encounter (HOSPITAL_BASED_OUTPATIENT_CLINIC_OR_DEPARTMENT_OTHER): Payer: Self-pay | Admitting: Obstetrics & Gynecology

## 2022-02-22 DIAGNOSIS — N951 Menopausal and female climacteric states: Secondary | ICD-10-CM

## 2022-02-22 MED ORDER — ESTRADIOL 0.1 MG/24HR TD PTWK: 0.1000 mg | MEDICATED_PATCH | TRANSDERMAL | 2 refills | Status: DC

## 2022-02-22 NOTE — Telephone Encounter (Signed)
Called pt to let her know that new Rx had been sent to pharmacy. Advised that Dr. Hyacinth Meeker would like to follow up on the medication change in 4-6 weeks. Appt provided

## 2022-03-26 ENCOUNTER — Ambulatory Visit (INDEPENDENT_AMBULATORY_CARE_PROVIDER_SITE_OTHER): Payer: 59 | Admitting: Allergy & Immunology

## 2022-03-26 ENCOUNTER — Encounter: Payer: Self-pay | Admitting: Allergy & Immunology

## 2022-03-26 VITALS — BP 104/66 | HR 58 | Temp 98.6°F | Resp 16

## 2022-03-26 DIAGNOSIS — J4520 Mild intermittent asthma, uncomplicated: Secondary | ICD-10-CM | POA: Diagnosis not present

## 2022-03-26 DIAGNOSIS — J310 Chronic rhinitis: Secondary | ICD-10-CM

## 2022-03-26 DIAGNOSIS — K9049 Malabsorption due to intolerance, not elsewhere classified: Secondary | ICD-10-CM

## 2022-03-26 NOTE — Progress Notes (Signed)
FOLLOW UP  Date of Service/Encounter:  03/26/22   Assessment:   Mild intermittent asthma, uncomplicated   Non-allergic rhinitis   Food intolerance - with negative testing (not interested in challenges)   Psoriasis and hidradenitis suppurativa - on Humira for this  Rabbit lover, although I did not learn anything new about them at this visit.  Plan/Recommendations:   1. Non-allergic rhinitis - Previous testing was only positive to an outdoor mold mix, which apparently fits your symptoms.  - It seems that you have a good handle on your symptoms.  - Continue taking: Xyzal (levocetirizine) 5mg  tablet once daily, Singulair (montelukast) 10mg  daily, and Xhance (fluticasone) 1-2 sprays per nostril daily  - You can use an extra dose of the antihistamine, if needed, for breakthrough symptoms.  - Consider nasal saline rinses 1-2 times daily to remove allergens from the nasal cavities as well as help with mucous clearance (this is especially helpful to do before the nasal sprays are given)  2. Food intolerance (seafood, milk products) -  Continue to avoid seafood as well as milk products. - You seem to have a good handle on these symptoms.   3. Mild intermittent asthma, uncomplicated - Spirometry not done today. - Continue with albuterol as needed.   4. Return in about 6 months (around 09/26/2022). Then we can space it out more after this.    Subjective:   Mary Davenport is a 46 y.o. female presenting today for follow up of  Chief Complaint  Patient presents with   Allergic Rhinitis    Asthma    Has used alb. Inh once and thinks it was mold that triggered it.     Mary Davenport has a history of the following: Patient Active Problem List   Diagnosis Date Noted   Menopausal symptoms 01/10/2022   Vitamin D deficiency 10/28/2020   History of hysterectomy 10/28/2020   Psoriatic arthritis ([facility])    Hidradenitis suppurativa 02/24/2017   S/P laparoscopic sleeve  gastrectomy+hiatus hernia repair Nov 2014 07/03/2013   GERD (gastroesophageal reflux disease) 04/25/2013    History obtained from: chart review and patient.  Mary Davenport is a 46 y.o. female presenting for a follow up visit.  She was last seen in May 2023 as a new patient.  She had testing that was negative to the entire panel with prick and intradermal testing.  We stopped all of her current meds and started Xyzal as well as Singulair and Xhance.  She had testing that was negative to number of foods.  She declined blood work and wanted to discontinue to avoid seafood and milk products.  For her asthma, she was using albuterol 4 puffs every 4-6 hours.  We did not feel like a controller medication was needed. She taught me a lot about rabbits, including the fact that the gestation period is 30 days.  Since last visit, she has done well.   Asthma/Respiratory Symptom History: She last used her albuterol one month ago. They had just come back to the [place] and it was very wet.  She used it once and everything got better. No prednisone was required. They typically go up to their [place] once per month, but this was a longer period of time since everyone had surgery. It had been a while since everyone was up there.   Allergic Rhinitis Symptom History: She is using the [place], but she is not regularly.  She is using it only as needed. She is getting it for $25 a  month.  She feels that it works much more effectively than previously sprays.  She is fine with the $25 price point.  She definitely feels like molds are a trigger for her.  Food Allergy Symptom History: She continues to avoid seafood and milk products.  There have been no accidental exposures.  Her EpiPen is up-to-date.  Otherwise, there have been no changes to her past medical history, surgical history, family history, or social history.    Review of Systems  Constitutional: Negative.  Negative for chills, fever, malaise/fatigue and weight loss.   HENT:  Positive for congestion. Negative for ear discharge and ear pain.        Positive for postnasal drip.  Eyes:  Negative for pain, discharge and redness.  Respiratory:  Negative for cough, sputum production, shortness of breath and wheezing.   Cardiovascular: Negative.  Negative for chest pain and palpitations.  Gastrointestinal:  Negative for abdominal pain, heartburn, nausea and vomiting.  Skin: Negative.  Negative for itching and rash.  Neurological:  Negative for dizziness and headaches.  Endo/Heme/Allergies:  Positive for environmental allergies. Does not bruise/bleed easily.  All other systems reviewed and are negative.      Objective:   Blood pressure 104/66, pulse (!) 58, temperature 98.6 F (37 C), resp. rate 16, SpO2 97 %. There is no height or weight on file to calculate BMI.    Physical Exam Vitals reviewed.  Constitutional:      Appearance: She is well-developed.     Comments: Very pleasant and talkative.  HENT:     Head: Normocephalic and atraumatic.     Right Ear: Tympanic membrane, ear canal and external ear normal. No drainage, swelling or tenderness. Tympanic membrane is not injected, scarred, erythematous, retracted or bulging.     Left Ear: Tympanic membrane, ear canal and external ear normal. No drainage, swelling or tenderness. Tympanic membrane is not injected, scarred, erythematous, retracted or bulging.     Nose: No nasal deformity, septal deviation, mucosal edema or rhinorrhea.     Right Turbinates: Enlarged and swollen.     Left Turbinates: Enlarged and swollen.     Right Sinus: No maxillary sinus tenderness or frontal sinus tenderness.     Left Sinus: No maxillary sinus tenderness or frontal sinus tenderness.     Mouth/Throat:     Mouth: Mucous membranes are not pale and not dry.     Pharynx: Uvula midline.  Eyes:     General:        Right eye: No discharge.        Left eye: No discharge.     Conjunctiva/sclera: Conjunctivae normal.      Right eye: Right conjunctiva is not injected. No chemosis.    Left eye: Left conjunctiva is not injected. No chemosis.    Pupils: Pupils are equal, round, and reactive to light.  Cardiovascular:     Rate and Rhythm: Normal rate and regular rhythm.     Heart sounds: Normal heart sounds.  Pulmonary:     Effort: Pulmonary effort is normal. No tachypnea, accessory muscle usage or respiratory distress.     Breath sounds: Normal breath sounds. No wheezing, rhonchi or rales.  Chest:     Chest wall: No tenderness.  Lymphadenopathy:     Head:     Right side of head: No submandibular, tonsillar or occipital adenopathy.     Left side of head: No submandibular, tonsillar or occipital adenopathy.     Cervical: No cervical adenopathy.  Skin:    Coloration: Skin is not pale.     Findings: No abrasion, erythema, petechiae or rash. Rash is not papular, urticarial or vesicular.  Neurological:     Mental Status: She is alert.  Psychiatric:        Behavior: Behavior is cooperative.      Diagnostic studies: none      Malachi Bonds, MD  [facility]

## 2022-03-26 NOTE — Patient Instructions (Addendum)
1. Non-allergic rhinitis - Previous testing was only positive to an outdoor mold mix, which apparently fits your symptoms.  - It seems that you have a good handle on your symptoms.  - Continue taking: Xyzal (levocetirizine) 5mg  tablet once daily, Singulair (montelukast) 10mg  daily, and Xhance (fluticasone) 1-2 sprays per nostril daily  - You can use an extra dose of the antihistamine, if needed, for breakthrough symptoms.  - Consider nasal saline rinses 1-2 times daily to remove allergens from the nasal cavities as well as help with mucous clearance (this is especially helpful to do before the nasal sprays are given)  2. Food intolerance (seafood, milk products) -  Continue to avoid seafood as well as milk products. - You seem to have a good handle on these symptoms.   3. Mild intermittent asthma, uncomplicated - Spirometry not done today. - Continue with albuterol as needed.   4. Return in about 6 months (around 09/26/2022). Then we can space it out more after this.    Please inform of any Emergency Department visits, hospitalizations, or changes in symptoms. Call 11/25/2022 before going to the ED for breathing or allergy symptoms since we might be able to fit you in for a sick visit. Feel free to contact us anytime with any questions, problems, or concerns.  It was a pleasure to see you again today!  Websites that have reliable patient information: 1. American Academy of Asthma, Allergy, and Immunology: www.aaaai.org 2. Food Allergy Research and Education (FARE): foodallergy.org 3. Mothers of Asthmatics: http://www.asthmacommunitynetwork.org 4. American College of Allergy, Asthma, and Immunology: www.acaai.org   COVID-19 Vaccine Information can be found at: [place] For questions related to vaccine distribution or appointments, please email [email or web address] or call [phone].   We realize that you might be  concerned about having an allergic reaction to the COVID19 vaccines. To help with that concern, WE ARE OFFERING THE COVID19 VACCINES IN OUR OFFICE! Ask the front desk for dates!     "Like" [email or web address] on Facebook and Instagram for our latest updates!      A healthy democracy works best when [phone] participate! Make sure you are registered to vote! If you have moved or changed any of your contact information, you will need to get this updated before voting!  In some cases, you MAY be able to register to vote online: [place]

## 2022-04-02 ENCOUNTER — Other Ambulatory Visit: Payer: Self-pay | Admitting: Rheumatology

## 2022-04-02 DIAGNOSIS — Z780 Asymptomatic menopausal state: Secondary | ICD-10-CM

## 2022-04-08 ENCOUNTER — Telehealth (INDEPENDENT_AMBULATORY_CARE_PROVIDER_SITE_OTHER): Payer: 59 | Admitting: Obstetrics & Gynecology

## 2022-04-08 ENCOUNTER — Encounter (HOSPITAL_BASED_OUTPATIENT_CLINIC_OR_DEPARTMENT_OTHER): Payer: Self-pay | Admitting: Obstetrics & Gynecology

## 2022-04-08 ENCOUNTER — Ambulatory Visit
Admission: RE | Admit: 2022-04-08 | Discharge: 2022-04-08 | Disposition: A | Discharge status: Home / Self Care | Payer: 59 | Source: Ambulatory Visit | Attending: Rheumatology | Admitting: Rheumatology

## 2022-04-08 ENCOUNTER — Other Ambulatory Visit (HOSPITAL_BASED_OUTPATIENT_CLINIC_OR_DEPARTMENT_OTHER): Payer: 59

## 2022-04-08 DIAGNOSIS — E559 Vitamin D deficiency, unspecified: Secondary | ICD-10-CM

## 2022-04-08 DIAGNOSIS — E538 Deficiency of other specified B group vitamins: Secondary | ICD-10-CM | POA: Diagnosis not present

## 2022-04-08 DIAGNOSIS — R5383 Other fatigue: Secondary | ICD-10-CM | POA: Diagnosis not present

## 2022-04-08 DIAGNOSIS — Z780 Asymptomatic menopausal state: Secondary | ICD-10-CM

## 2022-04-08 DIAGNOSIS — R232 Flushing: Secondary | ICD-10-CM

## 2022-04-08 NOTE — Progress Notes (Signed)
Virtual Visit via Video Note  I connected with Mary Davenport on 04/08/22 at  8:15 AM EDT by a video enabled telemedicine application and verified that I am speaking with the correct person using two identifiers.  Location: Patient: at husband's doctor appt Provider: office   I discussed the limitations of evaluation and management by telemedicine and the availability of in person appointments. The patient expressed understanding and agreed to proceed.  I could not hear patient so I called pt and could hear her via phone call.  History of Present Illness: 46 yo MWF with complaint of hot flashes, night sweats, brain fog and some emotional lability with lab work that is consistent with menopause.  Pt has undergone a hysterectomy so does not have any bleeding.  She was started on Climara patch 0.05mg  dosage and increased about 5 weeks ago to the 0.1mg  patch dosage.  Has not really noticed a lot of difference with her symptoms.  We discussed doing some additional blood work as well as possibly adding progesterone.  At this time, would not recommend adding additional estrogen.  Pt feels comfortable with some additional lab work today.  H/o bariatric surgery so does have some malabsorptive issues including lower b12.  Is on injections at this time.  Also has hx of lower Vit D level.  Is taking supplement.  Will plan to check these as well as thyroid, hba1c and estradiol level.  Pt will plan to come today for these.   Observations/Objective: WNWD WF, NAD  Assessment and Plan: 1. Hot flashes - continue climara patch for now - Thyroid Panel With TSH - Estradiol - Hemoglobin A1c  2. Fatigue, unspecified type - Thyroid Panel With TSH - Estradiol - Hemoglobin A1c  3. Vitamin D deficiency - VITAMIN D 25 Hydroxy (Vit-D Deficiency, Fractures)  4. B12 deficiency - B12   Follow Up Instructions: I discussed the assessment and treatment plan with the patient. The patient was provided an  opportunity to ask questions and all were answered. The patient agreed with the plan and demonstrated an understanding of the instructions.   The patient was advised to call back or seek an in-person evaluation if the symptoms worsen or if the condition fails to improve as anticipated.  I provided 15 minutes of non-face-to-face time during this encounter.   Jerene Bears, MD

## 2022-04-09 LAB — THYROID PANEL WITH TSH
Free Thyroxine Index: 2.5 (ref 1.2–4.9)
T3 Uptake Ratio: 28 % (ref 24–39)
T4, Total: 9.1 ug/dL (ref 4.5–12.0)
TSH: 1.01 u[IU]/mL (ref 0.450–4.500)

## 2022-04-09 LAB — HEMOGLOBIN A1C
Est. average glucose Bld gHb Est-mCnc: 108 mg/dL
Hgb A1c MFr Bld: 5.4 % (ref 4.8–5.6)

## 2022-04-09 LAB — ESTRADIOL: Estradiol: 55.9 pg/mL

## 2022-04-09 LAB — VITAMIN D 25 HYDROXY (VIT D DEFICIENCY, FRACTURES): Vit D, 25-Hydroxy: 16.2 ng/mL — ABNORMAL LOW (ref 30.0–100.0)

## 2022-04-09 LAB — VITAMIN B12: Vitamin B-12: 210 pg/mL — ABNORMAL LOW (ref 232–1245)

## 2022-04-12 ENCOUNTER — Other Ambulatory Visit (HOSPITAL_BASED_OUTPATIENT_CLINIC_OR_DEPARTMENT_OTHER): Payer: Self-pay | Admitting: *Deleted

## 2022-04-12 MED ORDER — PROGESTERONE MICRONIZED 100 MG PO CAPS: 100.0000 mg | ORAL_CAPSULE | Freq: Every day | ORAL | 0 refills | Status: DC

## 2022-04-12 NOTE — Progress Notes (Signed)
Rx sent to pharmacy for prometrium, per Dr Hyacinth Meeker

## 2022-04-20 ENCOUNTER — Encounter (HOSPITAL_BASED_OUTPATIENT_CLINIC_OR_DEPARTMENT_OTHER): Payer: Self-pay | Admitting: Obstetrics & Gynecology

## 2022-05-05 ENCOUNTER — Other Ambulatory Visit (HOSPITAL_BASED_OUTPATIENT_CLINIC_OR_DEPARTMENT_OTHER): Payer: Self-pay | Admitting: Obstetrics & Gynecology

## 2022-05-07 ENCOUNTER — Other Ambulatory Visit (HOSPITAL_BASED_OUTPATIENT_CLINIC_OR_DEPARTMENT_OTHER): Payer: Self-pay | Admitting: Obstetrics & Gynecology

## 2022-05-07 ENCOUNTER — Encounter (HOSPITAL_BASED_OUTPATIENT_CLINIC_OR_DEPARTMENT_OTHER): Payer: Self-pay | Admitting: Obstetrics & Gynecology

## 2022-05-07 DIAGNOSIS — N951 Menopausal and female climacteric states: Secondary | ICD-10-CM

## 2022-05-07 MED ORDER — ESTRADIOL 0.05 MG/24HR TD PTWK: 0.0500 mg | MEDICATED_PATCH | TRANSDERMAL | 2 refills | Status: DC

## 2022-05-09 ENCOUNTER — Other Ambulatory Visit (HOSPITAL_BASED_OUTPATIENT_CLINIC_OR_DEPARTMENT_OTHER): Payer: Self-pay | Admitting: Obstetrics & Gynecology

## 2022-05-09 DIAGNOSIS — N951 Menopausal and female climacteric states: Secondary | ICD-10-CM

## 2022-06-18 ENCOUNTER — Other Ambulatory Visit: Payer: Self-pay | Admitting: Family

## 2022-07-29 ENCOUNTER — Other Ambulatory Visit (HOSPITAL_BASED_OUTPATIENT_CLINIC_OR_DEPARTMENT_OTHER): Payer: Self-pay | Admitting: Obstetrics & Gynecology

## 2022-07-29 DIAGNOSIS — N951 Menopausal and female climacteric states: Secondary | ICD-10-CM

## 2022-09-29 ENCOUNTER — Ambulatory Visit: Payer: 59 | Admitting: Allergy & Immunology

## 2022-10-06 ENCOUNTER — Other Ambulatory Visit: Payer: Self-pay

## 2022-10-06 ENCOUNTER — Ambulatory Visit (INDEPENDENT_AMBULATORY_CARE_PROVIDER_SITE_OTHER): Payer: 59 | Admitting: Allergy & Immunology

## 2022-10-06 ENCOUNTER — Encounter: Payer: Self-pay | Admitting: Allergy & Immunology

## 2022-10-06 VITALS — BP 118/72 | HR 84 | Temp 97.1°F | Resp 20 | Ht 65.0 in | Wt 207.0 lb

## 2022-10-06 DIAGNOSIS — K9049 Malabsorption due to intolerance, not elsewhere classified: Secondary | ICD-10-CM

## 2022-10-06 DIAGNOSIS — J310 Chronic rhinitis: Secondary | ICD-10-CM

## 2022-10-06 DIAGNOSIS — J4520 Mild intermittent asthma, uncomplicated: Secondary | ICD-10-CM

## 2022-10-06 MED ORDER — MONTELUKAST SODIUM 10 MG PO TABS: 10.0000 mg | ORAL_TABLET | Freq: Every day | ORAL | 3 refills | Status: DC

## 2022-10-06 MED ORDER — ALBUTEROL SULFATE HFA 108 (90 BASE) MCG/ACT IN AERS: 2.0000 | INHALATION_SPRAY | Freq: Four times a day (QID) | RESPIRATORY_TRACT | 2 refills | Status: DC | PRN

## 2022-10-06 MED ORDER — LEVOCETIRIZINE DIHYDROCHLORIDE 5 MG PO TABS: 5.0000 mg | ORAL_TABLET | Freq: Every evening | ORAL | 3 refills | Status: DC

## 2022-10-06 NOTE — Patient Instructions (Addendum)
1. Non-allergic rhinitis - It seems that everything is under good control.  - Continue taking: Xyzal (levocetirizine) 11m tablet once daily, Singulair (montelukast) 110mdaily, and Xhance (fluticasone) 1-2 sprays per nostril daily  - You can use an extra dose of the antihistamine, if needed, for breakthrough symptoms.  - Consider nasal saline rinses 1-2 times daily to remove allergens from the nasal cavities as well as help with mucous clearance (this is especially helpful to do before the nasal sprays are given)  2. Food intolerance (seafood, milk products) -  Continue to avoid seafood as well as milk products. - You seem to have a good handle on these symptoms.   3. Mild intermittent asthma, uncomplicated - Spirometry looks good today.  - Continue with albuterol as needed.   4. Return in about 1 year (around 10/07/2023).    Please inform usKoreaf any Emergency Department visits, hospitalizations, or changes in symptoms. Call usKoreaefore going to the ED for breathing or allergy symptoms since we might be able to fit you in for a sick visit. Feel free to contact usKoreanytime with any questions, problems, or concerns.  It was a pleasure to see you again today! LOVE the cute pics of the rabbits!   Websites that have reliable patient information: 1. American Academy of Asthma, Allergy, and Immunology: www.aaaai.org 2. Food Allergy Research and Education (FARE): foodallergy.org 3. Mothers of Asthmatics: http://www.asthmacommunitynetwork.org 4. American College of Allergy, Asthma, and Immunology: www.acaai.org   COVID-19 Vaccine Information can be found at: [email or web address] questions related to vaccine distribution or appointments, please email [email or web address] or call [phone]  We realize that you might be concerned about having an allergic reaction to the COVID19 vaccines. To help with that concern, WE ARE OFFERING THE  COVID19 VACCINES IN OUR OFFICE! Ask the front desk for dates!     "Like" usKorean Facebook and Instagram for our latest updates!      A healthy democracy works best when ALNew York Life Insurancearticipate! Make sure you are registered to vote! If you have moved or changed any of your contact information, you will need to get this updated before voting!  In some cases, you MAY be able to register to vote online: [email or web address]

## 2022-10-06 NOTE — Progress Notes (Signed)
FOLLOW UP  Date of Service/Encounter:  10/06/22   Assessment:   Mild intermittent asthma, uncomplicated   Non-allergic rhinitis   Food intolerance - with negative testing (seafood and dairy, CAN use butter in moderation)   Psoriasis and hidradenitis suppurativa - on Humira for this   Rabbit lover, although I did not learn anything new about them at this visit.  Plan/Recommendations:    Patient Instructions  1. Non-allergic rhinitis - It seems that everything is under good control.  - Continue taking: Xyzal (levocetirizine) 87m tablet once daily, Singulair (montelukast) 176mdaily, and Xhance (fluticasone) 1-2 sprays per nostril daily  - You can use an extra dose of the antihistamine, if needed, for breakthrough symptoms.  - Consider nasal saline rinses 1-2 times daily to remove allergens from the nasal cavities as well as help with mucous clearance (this is especially helpful to do before the nasal sprays are given)  2. Food intolerance (seafood, milk products) -  Continue to avoid seafood as well as milk products. - You seem to have a good handle on these symptoms.   3. Mild intermittent asthma, uncomplicated - Spirometry looks good today.  - Continue with albuterol as needed.   4. Return in about 1 year (around 10/07/2023).    Please inform usKoreaf any Emergency Department visits, hospitalizations, or changes in symptoms. Call usKoreaefore going to the ED for breathing or allergy symptoms since we might be able to fit you in for a sick visit. Feel free to contact usKoreanytime with any questions, problems, or concerns.  It was a pleasure to see you again today! LOVE the cute pics of the rabbits!   Websites that have reliable patient information: 1. American Academy of Asthma, Allergy, and Immunology: www.aaaai.org 2. Food Allergy Research and Education (FARE): foodallergy.org 3. Mothers of Asthmatics: http://www.asthmacommunitynetwork.org 4. American College of Allergy,  Asthma, and Immunology: www.acaai.org   COVID-19 Vaccine Information can be found at: [email or web address] questions related to vaccine distribution or appointments, please email [email or web address] or call [phone]  We realize that you might be concerned about having an allergic reaction to the COVID19 vaccines. To help with that concern, WE ARE OFFERING THE COVID19 VACCINES IN OUR OFFICE! Ask the front desk for dates!     "Like" usKorean Facebook and Instagram for our latest updates!      A healthy democracy works best when ALNew York Life Insurancearticipate! Make sure you are registered to vote! If you have moved or changed any of your contact information, you will need to get this updated before voting!  In some cases, you MAY be able to register to vote online: [email or web address]   Subjective:   Mary Davenport a [age].o. female presenting today for follow up of  Chief Complaint  Patient presents with   Follow-up    Pt states she has been doing good .    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  Mary Davenport a [age].o. female presenting for a follow up visit.  She was last seen in  August 2023.  At that time, we continue with Xyzal as well as Singulair and Xhance.  For her asthma, we continue with albuterol as needed.  She continues to avoid seafood as well as milk products.  She has six rabbits. She has the cutest picture of them lined  up. She has two in the home that have their own room. There are four outdoors that come in when it is particularly cold. They are very house trained.   Asthma/Respiratory Symptom History: Breathing is well controlled. She had influenza in December and she had to use her inhaler. She did not get Tamiflu because she did not get diagnosed until day 6.  She did not go through an entire inhaler. She did not miss her work since she was off anyway. They did a later Christmas.  She has not required any prednisone and has not been to the emergency room.  She feels that her asthma is under fair control.  Allergic Rhinitis Symptom History: Nasal symptoms are well controlled. She has the [place] and she does not use it daily. She is doing something through the company with a max $25 deal for the [place] . She did have allergy shots for through [facility] around 10-15 years ago.    Psoriasis is well controlled with the Humira. She reports that this controls her suppurativa hiadrenitis. She can tell that this gets worse when she is late on her medication. Weekly administration keeps her well controlled. They normally give it every two weeks, so she has to get it every week.   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  Otherwise, there have been no changes to her past medical history, surgical history, family history, or social history.    Review of Systems  Constitutional: Negative.  Negative for chills, fever, malaise/fatigue and weight loss.  HENT:  Negative for congestion, ear discharge and ear pain.        Positive for postnasal drip.  Eyes:  Negative for pain, discharge and redness.  Respiratory:  Negative for cough, sputum production, shortness of breath and wheezing.   Cardiovascular: Negative.  Negative for chest pain and palpitations.  Gastrointestinal:  Negative for abdominal pain, constipation, diarrhea, heartburn, nausea and vomiting.  Skin:  Negative.  Negative for itching and rash.  Neurological:  Negative for dizziness and headaches.  Endo/Heme/Allergies:  Negative for environmental allergies. Does not bruise/bleed easily.  All other systems reviewed and are negative.      Objective:   Blood pressure 118/72, pulse 84, temperature (!) 97.1 F (36.2 C), resp. rate 20, height 5' 5"$  (1.651 m), weight 207 lb (93.9 kg), SpO2 95 %. Body mass index is 34.45 kg/m.    Physical Exam Vitals reviewed.  Constitutional:      Appearance: She is well-developed.     Comments: Very pleasant and talkative.  HENT:     Head: Normocephalic and atraumatic.     Right Ear: Tympanic membrane, ear canal and external ear normal. No drainage, swelling or tenderness. Tympanic membrane is not injected, scarred, erythematous, retracted or bulging.     Left Ear: Tympanic membrane, ear canal and external ear normal. No drainage, swelling or tenderness. Tympanic membrane is not injected, scarred, erythematous, retracted or bulging.     Nose: No nasal deformity, septal deviation, mucosal edema or rhinorrhea.     Right Turbinates: Enlarged, swollen and pale.     Left Turbinates: Enlarged, swollen and pale.  Right Sinus: No maxillary sinus tenderness or frontal sinus tenderness.     Left Sinus: No maxillary sinus tenderness or frontal sinus tenderness.     Mouth/Throat:     Mouth: Mucous membranes are not pale and not dry.     Pharynx: Uvula midline.  Eyes:     General:        Right eye: No discharge.        Left eye: No discharge.     Conjunctiva/sclera: Conjunctivae normal.     Right eye: Right conjunctiva is not injected. No chemosis.    Left eye: Left conjunctiva is not injected. No chemosis.    Pupils: Pupils are equal, round, and reactive to light.  Cardiovascular:     Rate and Rhythm: Normal rate and regular rhythm.     Heart sounds: Normal heart sounds.  Pulmonary:     Effort: Pulmonary effort is normal. No tachypnea, accessory  muscle usage or respiratory distress.     Breath sounds: Normal breath sounds. No wheezing, rhonchi or rales.     Comments: Moving air well in all lung fields. No increased work of breathing noted. Chest:     Chest wall: No tenderness.  Lymphadenopathy:     Head:     Right side of head: No submandibular, tonsillar or occipital adenopathy.     Left side of head: No submandibular, tonsillar or occipital adenopathy.     Cervical: No cervical adenopathy.  Skin:    Coloration: Skin is not pale.     Findings: No abrasion, erythema, petechiae or rash. Rash is not papular, urticarial or vesicular.  Neurological:     Mental Status: She is alert.  Psychiatric:        Behavior: Behavior is cooperative.      Diagnostic studies:    Spirometry: results normal (FEV1: 2.35/84%, FVC: 2.88/83%, FEV1/FVC: 82%).    Spirometry consistent with normal pattern.   Allergy Studies: none       Salvatore Marvel, MD  [facility]

## 2022-10-16 ENCOUNTER — Telehealth: Payer: 59 | Admitting: Nurse Practitioner

## 2022-10-16 DIAGNOSIS — J4521 Mild intermittent asthma with (acute) exacerbation: Secondary | ICD-10-CM | POA: Diagnosis not present

## 2022-10-16 MED ORDER — BENZONATATE 100 MG PO CAPS: 100.0000 mg | ORAL_CAPSULE | Freq: Three times a day (TID) | ORAL | 0 refills | Status: DC | PRN

## 2022-10-16 MED ORDER — PREDNISONE 20 MG PO TABS: 40.0000 mg | ORAL_TABLET | Freq: Every day | ORAL | 0 refills | Status: AC

## 2022-10-16 NOTE — Patient Instructions (Signed)
Mary Davenport, thank you for joining Chevis Pretty, FNP for today's virtual visit.  While this provider is not your primary care provider (PCP), if your PCP is located in our provider database this encounter information will be shared with them immediately following your visit.   [name] account gives you access to today's visit and all your visits, tests, and labs performed at [facility] " click here if you don't have a [facility] account or go to [email or web address]  Consent: (Patient) Mary Davenport provided verbal consent for this virtual visit at the beginning of the encounter.  Current Medications:  Current Outpatient Medications:    benzonatate (TESSALON PERLES) 100 MG capsule, Take 1 capsule (100 mg total) by mouth 3 (three) times daily as needed., Disp: 20 capsule, Rfl: 0   predniSONE (DELTASONE) 20 MG tablet, Take 2 tablets (40 mg total) by mouth daily with breakfast for 5 days. 2 po daily for 5 days, Disp: 10 tablet, Rfl: 0   albuterol (VENTOLIN HFA) 108 (90 Base) MCG/ACT inhaler, Inhale 2 puffs into the lungs every 6 (six) hours as needed for wheezing or shortness of breath., Disp: 18 g, Rfl: 2   cyanocobalamin (VITAMIN B12) 1000 MCG/ML injection, Inject 1,000 mcg into the muscle once. (Patient not taking: Reported on 10/06/2022), Disp: , Rfl:    escitalopram (LEXAPRO) 10 MG tablet, , Disp: , Rfl:    estradiol (CLIMARA - DOSED IN MG/24 HR) 0.05 mg/24hr patch, PLACE 1 PATCH (0.05 MG TOTAL) ONTO THE SKIN ONCE A WEEK., Disp: 12 patch, Rfl: 1   Fluticasone Propionate (XHANCE) 93 MCG/ACT EXHU, Place 1-2 sprays into the nose daily. (Patient not taking: Reported on 10/06/2022), Disp: 16 mL, Rfl: 5   HUMIRA PEN 40 MG/0.8ML PNKT, , Disp: , Rfl:    levocetirizine (XYZAL) 5 MG tablet, Take 1 tablet (5 mg total) by mouth every evening., Disp: 90 tablet, Rfl: 3   montelukast (SINGULAIR) 10 MG tablet, Take 1 tablet (10 mg total) by  mouth at bedtime., Disp: 90 tablet, Rfl: 3   Multiple Vitamins-Minerals (MULTIVITAMIN ADULT) CHEW, Chew by mouth., Disp: , Rfl:    Medications ordered in this encounter:  Meds ordered this encounter  Medications   predniSONE (DELTASONE) 20 MG tablet    Sig: Take 2 tablets (40 mg total) by mouth daily with breakfast for 5 days. 2 po daily for 5 days    Dispense:  10 tablet    Refill:  0    Order Specific Question:   Supervising Provider    Answer:   Chase Picket [id number]   benzonatate (TESSALON PERLES) 100 MG capsule    Sig: Take 1 capsule (100 mg total) by mouth 3 (three) times daily as needed.    Dispense:  20 capsule    Refill:  0    Order Specific Question:   Supervising Provider    Answer:   Chase Picket [id number]     *If you need refills on other medications prior to your next appointment, please contact your pharmacy*  Follow-Up: Call back or seek an in-person evaluation if the symptoms worsen or if the condition fails to improve as anticipated.  [place] [phone]  Other Instructions 1. Take meds as prescribed 2. Use a cool mist humidifier especially during the winter months and when heat has been humid. 3. Use saline nose sprays frequently 4. Saline irrigations of the nose can be very helpful if done frequently.  *  4X daily for 1 week*  * Use of a nettie pot can be helpful with this. Follow directions with this* 5. Drink plenty of fluids 6. Keep thermostat turn down low 7.For any cough or congestion- tessalon perles 8. For fever or aces or pains- take tylenol or ibuprofen appropriate for age and weight.  * for fevers greater than 101 orally you may alternate ibuprofen and tylenol every  3 hours.      If you have been instructed to have an in-person evaluation today at a local Urgent Care facility, please use the link below. It will take you to a list of all of our available [facility], including address, phone number and  hours of operation. Please do not delay care.  [facility]  If you or a family member do not have a primary care provider, use the link below to schedule a visit and establish care. When you choose a [facility] primary care physician or advanced practice provider, you gain a long-term partner in health. Find a Primary Care Provider  Learn more about [facility]'s in-office and virtual care options: [facility]

## 2022-10-16 NOTE — Progress Notes (Signed)
Virtual Visit Consent   Mary Davenport, you are scheduled for a virtual visit with Mary-Margaret Hassell Done, [place], a [facility] provider, today.     Just as with appointments in the office, your consent must be obtained to participate.  Your consent will be active for this visit and any virtual visit you may have with one of our providers in the next 365 days.     If you have a MyChart account, a copy of this consent can be sent to you electronically.  All virtual visits are billed to your insurance company just like a traditional visit in the office.    As this is a virtual visit, video technology does not allow for your provider to perform a traditional examination.  This may limit your provider's ability to fully assess your condition.  If your provider identifies any concerns that need to be evaluated in person or the need to arrange testing (such as labs, EKG, etc.), we will make arrangements to do so.     Although advances in technology are sophisticated, we cannot ensure that it will always work on either your end or our end.  If the connection with a video visit is poor, the visit may have to be switched to a telephone visit.  With either a video or telephone visit, we are not always able to ensure that we have a secure connection.     I need to obtain your verbal consent now.   Are you willing to proceed with your visit today? YES   Misako Trayana Boley has provided verbal consent on 10/16/2022 for a virtual visit (video or telephone).   Mary-Margaret Hassell Done, FNP   Date: 10/16/2022 6:50 PM   Virtual Visit via Video Note   I, Mary-Margaret Rockford Leinen, connected with Mary Davenport ([id number], [birth year]) on 10/16/22 at  7:00 PM EST by a video-enabled telemedicine application and verified that I am speaking with the correct person using two identifiers.  Location: Patient: Virtual Visit Location Patient: Home Provider: Virtual Visit Location Provider: Mobile   I  discussed the limitations of evaluation and management by telemedicine and the availability of in person appointments. The patient expressed understanding and agreed to proceed.    History of Present Illness: Mary Davenport is a 47 y.o. who identifies as a female who was assigned female at birth, and is being seen today for cough and wheezing.  HPI: [facility]. Was test for flu and covid and both were negative. She was told she had some wheezing that would clear wit coughing. Now she is wheezing all the time and feels sob.   Cough Episode onset: 4 days ago. The problem has been gradually worsening. The cough is Productive of sputum. Associated symptoms include nasal congestion and rhinorrhea. Pertinent negatives include no chest pain, chills or fever. Nothing aggravates the symptoms. Treatments tried: tessalon perles , tylenol , OYC cough and congestion meds and albuterol inhaler. The treatment provided mild relief. Her past medical history is significant for asthma.    Review of Systems  Constitutional:  Negative for chills and fever.  HENT:  Positive for rhinorrhea.   Respiratory:  Positive for cough.   Cardiovascular:  Negative for chest pain.    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   GERD (gastroesophageal reflux disease) 04/25/2013    Allergies:  Allergies  Allergen Reactions   Iodinated Contrast Media Hives   Lactose Intolerance (Gi) Other (See Comments)    Severe abdominal pain and vomiting   Shellfish Allergy Nausea And Vomiting   Sulfa Antibiotics Hives   Whey Protein [Protein] Other (See Comments)    Severe abdominal pain and vomiting    Erythromycin Rash   Sudafed [Pseudoephedrine] Palpitations   Medications:  Current Outpatient Medications:    albuterol (VENTOLIN HFA) 108 (90 Base) MCG/ACT inhaler, Inhale 2 puffs into the lungs every 6 (six) hours as needed for wheezing or shortness of breath., Disp: 18 g, Rfl: 2   cyanocobalamin (VITAMIN B12) 1000 MCG/ML injection, Inject 1,000 mcg into the muscle once. (Patient not taking: Reported on 10/06/2022), Disp: , Rfl:    escitalopram (LEXAPRO) 10 MG tablet, , Disp: , Rfl:    estradiol (CLIMARA - DOSED IN MG/24 HR) 0.05 mg/24hr patch, PLACE 1 PATCH (0.05 MG TOTAL) ONTO THE SKIN ONCE A WEEK., Disp: 12 patch, Rfl: 1   Fluticasone Propionate (XHANCE) 93 MCG/ACT EXHU, Place 1-2 sprays into the nose daily. (Patient not taking: Reported on 10/06/2022), Disp: 16 mL, Rfl: 5   HUMIRA PEN 40 MG/0.8ML PNKT, , Disp: , Rfl:    levocetirizine (XYZAL) 5 MG tablet, Take 1 tablet (5 mg total) by mouth every evening., Disp: 90 tablet, Rfl: 3   montelukast (SINGULAIR) 10 MG tablet, Take 1 tablet (10 mg total) by mouth at bedtime., Disp: 90 tablet, Rfl: 3   Multiple Vitamins-Minerals (MULTIVITAMIN ADULT) CHEW, Chew by mouth., Disp: , Rfl:   Observations/Objective: Patient is well-developed, well-nourished in no acute distress.  Resting comfortably  at home.  Head is normocephalic, atraumatic.  No labored breathing.  Speech is clear and coherent with logical content.  Patient is alert and oriented at baseline.  Hoarse voice cough Assessment and Plan:  Darcella Cordella Register in today with chief complaint of Cough and Wheezing   1. Mild intermittent asthma with acute exacerbation 1. Take meds as prescribed 2. Use a cool mist humidifier especially during the winter months and when heat has been humid. 3. Use saline nose sprays frequently 4. Saline irrigations of the nose can be very helpful if done frequently.  * 4X daily for 1 week*  * Use of a nettie pot can be helpful with this. Follow  directions with this* 5. Drink plenty of fluids 6. Keep thermostat turn down low 7.For any cough or congestion-  8. For fever or aces or pains- take tylenol or ibuprofen appropriate for age and weight.  * for fevers greater than 101 orally you may alternate ibuprofen and tylenol every  3 hours.   Continue albuterol  Meds ordered this encounter  Medications   predniSONE (DELTASONE) 20 MG tablet    Sig: Take 2 tablets (40 mg total) by mouth daily with breakfast for 5 days. 2 po daily for 5 days    Dispense:  10 tablet    Refill:  0    Order Specific Question:   Supervising Provider    Answer:   Chase Picket [id number]   benzonatate (TESSALON PERLES) 100 MG capsule    Sig: Take 1 capsule (100 mg total) by mouth 3 (three) times daily as needed.    Dispense:  20 capsule    Refill:  0    Order Specific Question:  Supervising Provider    Answer:   Chase Picket [id number]        Follow Up Instructions: I discussed the assessment and treatment plan with the patient. The patient was provided an opportunity to ask questions and all were answered. The patient agreed with the plan and demonstrated an understanding of the instructions.  A copy of instructions were sent to the patient via MyChart.  The patient was advised to call back or seek an in-person evaluation if the symptoms worsen or if the condition fails to improve as anticipated.  Time:  I spent 10 minutes with the patient via telehealth technology discussing the above problems/concerns.    Mary-Margaret Hassell Done, FNP

## 2022-11-15 ENCOUNTER — Other Ambulatory Visit (HOSPITAL_BASED_OUTPATIENT_CLINIC_OR_DEPARTMENT_OTHER): Payer: Self-pay | Admitting: Obstetrics & Gynecology

## 2022-11-15 DIAGNOSIS — Z1231 Encounter for screening mammogram for malignant neoplasm of breast: Secondary | ICD-10-CM

## 2022-11-16 ENCOUNTER — Ambulatory Visit (HOSPITAL_BASED_OUTPATIENT_CLINIC_OR_DEPARTMENT_OTHER)
Admission: RE | Admit: 2022-11-16 | Discharge: 2022-11-16 | Disposition: A | Discharge status: Home / Self Care | Payer: 59 | Source: Ambulatory Visit | Attending: Obstetrics & Gynecology | Admitting: Obstetrics & Gynecology

## 2022-11-16 DIAGNOSIS — Z1231 Encounter for screening mammogram for malignant neoplasm of breast: Secondary | ICD-10-CM | POA: Diagnosis not present

## 2022-12-06 ENCOUNTER — Encounter (HOSPITAL_BASED_OUTPATIENT_CLINIC_OR_DEPARTMENT_OTHER): Payer: Self-pay | Admitting: Obstetrics & Gynecology

## 2022-12-06 ENCOUNTER — Ambulatory Visit (INDEPENDENT_AMBULATORY_CARE_PROVIDER_SITE_OTHER): Payer: 59 | Admitting: Obstetrics & Gynecology

## 2022-12-06 VITALS — BP 113/70 | HR 72 | Ht 65.0 in | Wt 192.8 lb

## 2022-12-06 DIAGNOSIS — N951 Menopausal and female climacteric states: Secondary | ICD-10-CM

## 2022-12-06 DIAGNOSIS — Z90710 Acquired absence of both cervix and uterus: Secondary | ICD-10-CM

## 2022-12-06 DIAGNOSIS — Z01419 Encounter for gynecological examination (general) (routine) without abnormal findings: Secondary | ICD-10-CM

## 2022-12-06 DIAGNOSIS — L732 Hidradenitis suppurativa: Secondary | ICD-10-CM

## 2022-12-06 DIAGNOSIS — Z6832 Body mass index (BMI) 32.0-32.9, adult: Secondary | ICD-10-CM | POA: Diagnosis not present

## 2022-12-06 DIAGNOSIS — K9089 Other intestinal malabsorption: Secondary | ICD-10-CM | POA: Diagnosis not present

## 2022-12-06 DIAGNOSIS — Z9884 Bariatric surgery status: Secondary | ICD-10-CM

## 2022-12-06 MED ORDER — ESTRADIOL 0.05 MG/24HR TD PTWK: 0.0500 mg | MEDICATED_PATCH | TRANSDERMAL | 3 refills | Status: DC

## 2022-12-06 NOTE — Progress Notes (Signed)
47 y.o. G3P2 Married White or Caucasian female here for annual exam.  Has recently had to switch from Humira so adjusting and not sure it's working as well.  Followed by Zenovia Jordan, MD, at [facility].  Is on HRT and using climara patch.    Denies vaginal bleeding.    No LMP recorded. Patient has had a hysterectomy.          Sexually active: Yes.    The current method of family planning is status post hysterectomy.    Exercising: some, walking Smoker:  no  Health Maintenance: Pap:  02/24/2017 Negative History of abnormal Pap:  no MMG:  11/16/2022 Negative Colonoscopy:  10/2020, osteopenia BMD:   04/08/2022 Screening Labs: ordered for today   reports that she has never smoked. She has never been exposed to tobacco smoke. She has never used smokeless tobacco. She reports that she does not drink alcohol and does not use drugs.  Past Medical History:  Diagnosis Date   Allergy    tx. occ. with OTC meds   Arthritis    feet, back, hips   Asthma    has been controlled, not rescue inhaler at present   Carpal tunnel syndrome    Complication of anesthesia    coming out of anesthesia with D & C-combative,not breathing, when allowed to come out slower, this was not an issue   Depression    Endometriosis of ovary    more resolved since hysterectomy   GERD (gastroesophageal reflux disease)    Headache(784.0)    hx. migraines, none in 5 yrs   Heart murmur    Hiatal hernia    Hidradenitis suppurativa    followed by [facility]    MRSA carrier    Obesity    Plantar fasciitis 2009   PONV (postoperative nausea and vomiting)    Psoriatic arthritis    followed by Dr. Orlin Hilding at [facility]     Past Surgical History:  Procedure Laterality Date   ANKLE FRACTURE SURGERY Left 04/06/2020   hardware put in due to break   CHOLECYSTECTOMY     laparoscopic   DIAGNOSTIC LAPAROSCOPY  8/96   lysis/endometriosis (LKS)   DILATION AND  CURETTAGE OF UTERUS     LAPAROSCOPIC GASTRIC SLEEVE RESECTION N/A 07/03/2013   Procedure: LAPAROSCOPIC GASTRIC SLEEVE RESECTION;  Surgeon: Valarie Merino, MD;  Location: [facility];  Service: General;  Laterality: N/A;   LAPAROSCOPIC TOTAL HYSTERECTOMY  07/2010   robotic assisted   SHOULDER SURGERY Left 04/04/2018   scar tissue    TONSILLECTOMY     UPPER GI ENDOSCOPY  07/03/2013   Procedure: UPPER GI ENDOSCOPY;  Surgeon: Valarie Merino, MD;  Location: [facility];  Service: General;;    Current Outpatient Medications  Medication Sig Dispense Refill   albuterol (VENTOLIN HFA) 108 (90 Base) MCG/ACT inhaler Inhale 2 puffs into the lungs every 6 (six) hours as needed for wheezing or shortness of breath. 18 g 2   estradiol (CLIMARA - DOSED IN MG/24 HR) 0.05 mg/24hr patch PLACE 1 PATCH (0.05 MG TOTAL) ONTO THE SKIN ONCE A WEEK. 12 patch 1   levocetirizine (XYZAL) 5 MG tablet Take 1 tablet (5 mg total) by mouth every evening. 90 tablet 3   montelukast (SINGULAIR) 10 MG tablet Take 1 tablet (10 mg total) by mouth at bedtime. 90 tablet 3   Multiple Vitamins-Minerals (MULTIVITAMIN ADULT) CHEW Chew by mouth.     cyanocobalamin (VITAMIN B12) 1000 MCG/ML  injection Inject 1,000 mcg into the muscle once. (Patient not taking: Reported on 10/06/2022)     No current facility-administered medications for this visit.    Family History  Problem Relation Age of Onset   Heart disease Mother    Hyperlipidemia Mother    Hypertension Mother    Diabetes Maternal Grandmother    Hypertension Maternal Grandmother    Heart disease Maternal Grandmother    Breast cancer Other        maternal great aunt   Heart disease Maternal Grandfather     ROS: Constitutional: negative Genitourinary:negative  Exam:   BP 113/70 (BP Location: Left Arm, Patient Position: Sitting, Cuff Size: Large)   Pulse 72   Ht 5\' 5"  (1.651 m) Comment: Reported  Wt 192 lb 12.8 oz (87.5 kg)   BMI 32.08 kg/m   Height: 5\' 5"  (165.1 cm)  (Reported)  General appearance: alert, cooperative and appears stated age Head: Normocephalic, without obvious abnormality, atraumatic Neck: no adenopathy, supple, symmetrical, trachea midline and thyroid normal to inspection and palpation Lungs: clear to auscultation bilaterally Breasts: normal appearance, no masses or tenderness Heart: regular rate and rhythm Abdomen: soft, non-tender; bowel sounds normal; no masses,  no organomegaly Extremities: extremities normal, atraumatic, no cyanosis or edema Skin: Skin color, texture, turgor normal. No rashes or lesions Lymph nodes: Cervical, supraclavicular, and axillary nodes normal. No abnormal inguinal nodes palpated Neurologic: Grossly normal   Pelvic: External genitalia:  no lesions              Urethra:  normal appearing urethra with no masses, tenderness or lesions              Bartholins and Skenes: normal                 Vagina: normal appearing vagina with normal color and no discharge, no lesions              Cervix: absent              Pap taken: No. Bimanual Exam:  Uterus:  uterus absent              Adnexa: no mass, fullness, tenderness               Rectovaginal: Confirms               Anus:  normal sphincter tone, no lesions  Chaperone, Ina Homes, CMA, was present for exam.  Assessment/Plan: 1. Well woman exam with routine gynecological exam - Pap smear not indicated - Mammogram 10/2021 - Colonoscopy declined.  Cologuard neg 2022. - Bone mineral density 03/2022 - lab work ordered - vaccines reviewed/updated  2. Menopausal symptoms - will refill HRT.  Doing well with this dosage. - estradiol (CLIMARA - DOSED IN MG/24 HR) 0.05 mg/24hr patch; Place 1 patch (0.05 mg total) onto the skin once a week.  Dispense: 12 patch; Refill: 3  3. Other specified intestinal malabsorption - VITAMIN D 25 Hydroxy (Vit-D Deficiency, Fractures) - Vitamin B12  4. BMI 32.0-32.9,adult - Lipid panel - Comprehensive metabolic  panel  5. 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 Hidradenitis suppurativa  7. History of hysterectomy

## 2022-12-07 LAB — COMPREHENSIVE METABOLIC PANEL
ALT: 10 IU/L (ref 0–32)
AST: 16 IU/L (ref 0–40)
Albumin/Globulin Ratio: 1.6 (ref 1.2–2.2)
Albumin: 4.2 g/dL (ref 3.9–4.9)
Alkaline Phosphatase: 84 IU/L (ref 44–121)
BUN/Creatinine Ratio: 11 (ref 9–23)
BUN: 8 mg/dL (ref 6–24)
Bilirubin Total: 0.5 mg/dL (ref 0.0–1.2)
CO2: 19 mmol/L — ABNORMAL LOW (ref 20–29)
Calcium: 9.9 mg/dL (ref 8.7–10.2)
Chloride: 103 mmol/L (ref 96–106)
Creatinine, Ser: 0.71 mg/dL (ref 0.57–1.00)
Globulin, Total: 2.6 g/dL (ref 1.5–4.5)
Glucose: 81 mg/dL (ref 70–99)
Potassium: 4.8 mmol/L (ref 3.5–5.2)
Sodium: 139 mmol/L (ref 134–144)
Total Protein: 6.8 g/dL (ref 6.0–8.5)
eGFR: 104 mL/min/{1.73_m2} (ref >59)

## 2022-12-07 LAB — LIPID PANEL
Chol/HDL Ratio: 4 ratio (ref 0.0–4.4)
Cholesterol, Total: 168 mg/dL (ref 100–199)
HDL: 42 mg/dL (ref >39)
LDL Chol Calc (NIH): 114 mg/dL — ABNORMAL HIGH (ref 0–99)
Triglycerides: 64 mg/dL (ref 0–149)
VLDL Cholesterol Cal: 12 mg/dL (ref 5–40)

## 2022-12-07 LAB — VITAMIN B12: Vitamin B-12: 733 pg/mL (ref 232–1245)

## 2022-12-07 LAB — VITAMIN D 25 HYDROXY (VIT D DEFICIENCY, FRACTURES): Vit D, 25-Hydroxy: 27.1 ng/mL — ABNORMAL LOW (ref 30.0–100.0)

## 2023-07-19 DIAGNOSIS — R2232 Localized swelling, mass and lump, left upper limb: Secondary | ICD-10-CM | POA: Insufficient documentation

## 2023-09-14 ENCOUNTER — Other Ambulatory Visit: Payer: Self-pay | Admitting: Medical Genetics

## 2023-09-16 ENCOUNTER — Other Ambulatory Visit (HOSPITAL_COMMUNITY)
Admission: RE | Admit: 2023-09-16 | Discharge: 2023-09-16 | Disposition: A | Discharge status: Home / Self Care | Payer: Self-pay | Source: Ambulatory Visit | Attending: Oncology | Admitting: Oncology

## 2023-09-23 LAB — GENECONNECT MOLECULAR SCREEN: Genetic Analysis Overall Interpretation: NEGATIVE

## 2023-09-25 ENCOUNTER — Other Ambulatory Visit: Payer: Self-pay | Admitting: Allergy & Immunology

## 2023-10-14 ENCOUNTER — Ambulatory Visit (INDEPENDENT_AMBULATORY_CARE_PROVIDER_SITE_OTHER): Payer: 59 | Admitting: Allergy & Immunology

## 2023-10-14 ENCOUNTER — Encounter: Payer: Self-pay | Admitting: Allergy & Immunology

## 2023-10-14 VITALS — BP 110/70 | HR 89 | Temp 98.4°F | Resp 16 | Ht 63.0 in | Wt 186.1 lb

## 2023-10-14 DIAGNOSIS — J4520 Mild intermittent asthma, uncomplicated: Secondary | ICD-10-CM

## 2023-10-14 DIAGNOSIS — K9049 Malabsorption due to intolerance, not elsewhere classified: Secondary | ICD-10-CM

## 2023-10-14 DIAGNOSIS — J310 Chronic rhinitis: Secondary | ICD-10-CM

## 2023-10-14 MED ORDER — LEVOCETIRIZINE DIHYDROCHLORIDE 5 MG PO TABS: 5.0000 mg | ORAL_TABLET | Freq: Every evening | ORAL | 3 refills | Status: AC

## 2023-10-14 MED ORDER — MONTELUKAST SODIUM 10 MG PO TABS: 10.0000 mg | ORAL_TABLET | Freq: Every day | ORAL | 3 refills | Status: AC

## 2023-10-14 MED ORDER — ALBUTEROL SULFATE HFA 108 (90 BASE) MCG/ACT IN AERS: 2.0000 | INHALATION_SPRAY | Freq: Four times a day (QID) | RESPIRATORY_TRACT | 2 refills | Status: AC | PRN

## 2023-10-14 NOTE — Progress Notes (Signed)
FOLLOW UP  Date of Service/Encounter:  10/14/23   Assessment:   Mild intermittent asthma, uncomplicated   Non-allergic rhinitis   Food intolerance - with negative testing (seafood and dairy, CAN use butter in moderation)   Psoriasis and hidradenitis suppurativa - on Humira for this   Rabbit lover, although I did not learn anything new about them at this visit.  Plan/Recommendations:   1. Non-allergic rhinitis - It seems that everything is under good control.  - Continue taking: Xyzal (levocetirizine) 5mg  tablet once daily, Singulair (montelukast) 10mg  daily, and Xhance (fluticasone) 1-2 sprays per nostril daily  - You can use an extra dose of the antihistamine, if needed, for breakthrough symptoms.  - Consider nasal saline rinses 1-2 times daily to remove allergens from the nasal cavities as well as help with mucous clearance (this is especially helpful to do before the nasal sprays are given)  2. Food intolerance (seafood, milk products) -  Continue to avoid seafood as well as milk products. - You seem to have a good handle on these symptoms.   3. Mild intermittent asthma, uncomplicated - Spirometry looked phenomenal today.  - Continue with albuterol as needed.   4. Return in about 1 year (around 10/13/2024). You can have the follow up appointment with Dr. Dellis Anes or a Nurse Practicioner (our Nurse Practitioners are excellent and always have Physician oversight!).    Subjective:   Mary Davenport is a 48 y.o. female presenting today for follow up of  Chief Complaint  Patient presents with   Follow-up    Mary Davenport has a history of the following: Patient Active Problem List   Diagnosis Date Noted   B12 deficiency 04/08/2022   Menopausal symptoms 01/10/2022   Vitamin D deficiency 10/28/2020   History of hysterectomy 10/28/2020   Psoriatic arthritis (HCC)    Hidradenitis suppurativa 02/24/2017   S/P laparoscopic sleeve gastrectomy+hiatus  hernia repair Nov 2014 07/03/2013   GERD (gastroesophageal reflux disease) 04/25/2013    History obtained from: chart review and patient.  Discussed the use of AI scribe software for clinical note transcription with the patient and/or guardian, who gave verbal consent to proceed.  Mary Davenport is a 48 y.o. female presenting for a follow up visit.  She was last seen in February 2024.  At that time, everything was under as well as Singulair and Xhance.  She continues to avoid seafood.  For her asthma, spirometry was normal. We continued with albuterol as needed.   Since the last visit, she has done well.  She shows me a group picture of 5 out of her 6 rabbits.  Apparently the sixth rabbit did not want to be photographed.  There is a mother and the father and the rest of her siblings.  The males have all been fixed.  The females are not fixed, but apparently all they do when they are and heat is to pull out their hair and make a nest of sorts. The picture is super adorable.  Asthma/Respiratory Symptom History: Her breathing is described as 'pretty good', with only one use of her albuterol inhaler due to a viral illness. She experiences no nocturnal cough or current respiratory distress.  She used albuterol once or twice in the past year.  She has not been on prednisone.  She has not been to the emergency room.  Allergic Rhinitis Symptom History: Her nasal symptoms, such as a runny nose, are well-controlled with Xyzal (levocetirizine) and montelukast. Timmothy Sours is not covered by her  insurance, limiting its use. Her current medication regimen includes montelukast and levocetirizine, which she finds effective.  She has not been on antibiotics for any sinus or ear infections.  Food Allergy Symptom History: She has food allergies, particularly to seafood, and avoids it to prevent reactions. She reacts to iodine in fried seafood, which is released during cooking, and opts for grilled chicken or steak when dining  out, especially at the beach, to avoid these allergens.  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 She is aware of her family history of COPD and asthma.   Otherwise, there have been no changes to her past medical history, surgical history, family history, or social history.    Review of systems otherwise negative other than that mentioned in the HPI.    Objective:   Blood pressure 110/70, pulse 89, temperature 98.4 F (36.9 C), resp. rate 16, height 5\' 3"  (1.6 m), weight 186 lb 2 oz (84.4 kg), SpO2 96%. Body mass index is 32.97 kg/m.    Physical Exam Vitals reviewed.  Constitutional:      Appearance: She is well-developed.     Comments: Very pleasant and talkative.  HENT:     Head: Normocephalic and atraumatic.     Right Ear: Tympanic membrane, ear canal and external ear normal. No drainage, swelling or tenderness. Tympanic membrane is not injected, scarred, erythematous, retracted or bulging.     Left Ear: Tympanic membrane, ear canal and external ear normal. No drainage, swelling or tenderness. Tympanic membrane is not injected, scarred, erythematous, retracted or bulging.     Nose: No nasal deformity, septal deviation, mucosal edema or rhinorrhea.     Right Turbinates: Enlarged, swollen and pale.     Left Turbinates: Enlarged, swollen and pale.     Right Sinus: No maxillary sinus tenderness or frontal sinus tenderness.     Left Sinus: No maxillary sinus tenderness or frontal sinus tenderness.     Comments: Mucosa appears normal. Minimal/absent rhinorrhea.     Mouth/Throat:     Lips: Pink.     Mouth: Mucous membranes are moist. Mucous membranes are not pale and not dry.     Pharynx: Uvula midline.  Eyes:     General:        Right eye: No discharge.        Left eye: No discharge.     Conjunctiva/sclera: Conjunctivae normal.     Right eye:  Right conjunctiva is not injected. No chemosis.    Left eye: Left conjunctiva is not injected. No chemosis.    Pupils: Pupils are equal, round, and reactive to light.  Cardiovascular:     Rate and Rhythm: Normal rate and regular rhythm.     Heart sounds: Normal heart sounds.  Pulmonary:     Effort: Pulmonary effort is normal. No tachypnea, accessory muscle usage or respiratory distress.     Breath sounds: Normal breath sounds. No wheezing, rhonchi or rales.     Comments: Moving air well in all lung fields. No increased work of breathing noted. Chest:     Chest wall: No tenderness.  Lymphadenopathy:     Head:     Right side of head: No submandibular, tonsillar or occipital adenopathy.     Left side of head: No submandibular, tonsillar or occipital adenopathy.     Cervical: No cervical adenopathy.  Skin:  Coloration: Skin is not pale.     Findings: No abrasion, erythema, petechiae or rash. Rash is not papular, urticarial or vesicular.  Neurological:     Mental Status: She is alert.  Psychiatric:        Behavior: Behavior is cooperative.      Diagnostic studies:    Spirometry: results normal (FEV1: 2.91/109%, FVC: 3.57/107%, FEV1/FVC: 82%).    Spirometry consistent with normal pattern.    Allergy Studies: none       Malachi Bonds, MD  [facility]

## 2023-10-14 NOTE — Patient Instructions (Addendum)
1. Non-allergic rhinitis - It seems that everything is under good control.  - Continue taking: Xyzal (levocetirizine) 5mg  tablet once daily, Singulair (montelukast) 10mg  daily, and Xhance (fluticasone) 1-2 sprays per nostril daily  - You can use an extra dose of the antihistamine, if needed, for breakthrough symptoms.  - Consider nasal saline rinses 1-2 times daily to remove allergens from the nasal cavities as well as help with mucous clearance (this is especially helpful to do before the nasal sprays are given)  2. Food intolerance (seafood, milk products) -  Continue to avoid seafood as well as milk products. - You seem to have a good handle on these symptoms.   3. Mild intermittent asthma, uncomplicated - Spirometry looked phenomenal today.  - Continue with albuterol as needed.   4. Return in about 1 year (around 10/13/2024). You can have the follow up appointment with Dr. Dellis Anes or a Nurse Practicioner (our Nurse Practitioners are excellent and always have Physician oversight!).    Please inform us of any Emergency Department visits, hospitalizations, or changes in symptoms. Call us before going to the ED for breathing or allergy symptoms since we might be able to fit you in for a sick visit. Feel free to contact us anytime with any questions, problems, or concerns.  It was a pleasure to see you again today!  Websites that have reliable patient information: 1. American Academy of Asthma, Allergy, and Immunology: www.aaaai.org 2. Food Allergy Research and Education (FARE): foodallergy.org 3. Mothers of Asthmatics: http://www.asthmacommunitynetwork.org 4. American College of Allergy, Asthma, and Immunology: www.acaai.org      "Like" [place] on Facebook and Instagram for our latest updates!      A healthy democracy works best when [employer] participate! Make sure you are registered to vote! If you have moved or changed any of your contact information, you will need to get this  updated before voting! Scan the QR codes below to learn more!

## 2023-11-20 ENCOUNTER — Encounter (HOSPITAL_BASED_OUTPATIENT_CLINIC_OR_DEPARTMENT_OTHER): Payer: 59 | Admitting: Radiology

## 2023-11-20 DIAGNOSIS — Z1231 Encounter for screening mammogram for malignant neoplasm of breast: Secondary | ICD-10-CM

## 2023-11-22 ENCOUNTER — Other Ambulatory Visit (HOSPITAL_BASED_OUTPATIENT_CLINIC_OR_DEPARTMENT_OTHER): Payer: Self-pay | Admitting: Obstetrics & Gynecology

## 2023-11-22 DIAGNOSIS — Z1231 Encounter for screening mammogram for malignant neoplasm of breast: Secondary | ICD-10-CM

## 2023-12-05 ENCOUNTER — Ambulatory Visit (HOSPITAL_BASED_OUTPATIENT_CLINIC_OR_DEPARTMENT_OTHER)
Admission: RE | Admit: 2023-12-05 | Discharge: 2023-12-05 | Disposition: A | Discharge status: Home / Self Care | Source: Ambulatory Visit | Attending: Obstetrics & Gynecology | Admitting: Obstetrics & Gynecology

## 2023-12-05 ENCOUNTER — Encounter (HOSPITAL_BASED_OUTPATIENT_CLINIC_OR_DEPARTMENT_OTHER): Payer: Self-pay | Admitting: Radiology

## 2023-12-05 DIAGNOSIS — Z1231 Encounter for screening mammogram for malignant neoplasm of breast: Secondary | ICD-10-CM | POA: Diagnosis present

## 2023-12-07 ENCOUNTER — Ambulatory Visit
Admission: RE | Admit: 2023-12-07 | Discharge: 2023-12-07 | Disposition: A | Discharge status: Home / Self Care | Source: Ambulatory Visit

## 2023-12-07 ENCOUNTER — Other Ambulatory Visit: Payer: Self-pay

## 2023-12-07 VITALS — BP 120/84 | HR 67 | Temp 98.3°F | Resp 20

## 2023-12-07 DIAGNOSIS — H6591 Unspecified nonsuppurative otitis media, right ear: Secondary | ICD-10-CM | POA: Diagnosis not present

## 2023-12-07 DIAGNOSIS — H6991 Unspecified Eustachian tube disorder, right ear: Secondary | ICD-10-CM

## 2023-12-07 MED ORDER — PREDNISONE 20 MG PO TABS: 40.0000 mg | ORAL_TABLET | Freq: Every day | ORAL | 0 refills | Status: AC

## 2023-12-07 MED ORDER — FLUTICASONE PROPIONATE 50 MCG/ACT NA SUSP: 2.0000 | Freq: Every day | NASAL | 0 refills | Status: AC

## 2023-12-07 NOTE — ED Triage Notes (Signed)
 Pt reports right ear pain with ear fullness and pressure since last night. Denies any known injury, foreign body.

## 2023-12-07 NOTE — ED Provider Notes (Signed)
 [facility]    CSN: [id number] Arrival date & time: 12/07/23  1527      History   Chief Complaint Chief Complaint  Patient presents with   Ear Fullness    Ear infection? - Entered by patient    HPI Khandi Reegan Mctighe is a 48 y.o. female.   The history is provided by the patient.   Patient presents with a 1 day history of right ear fullness, pressure, and pain.  Patient states right ear feels "clogged."  Patient denies fever, chills, ear drainage, nasal congestion, runny nose, or cough.  Patient reports underlying history of seasonal allergies.  States that she has been taking levocetirizine and Singulair.  Patient also with underlying history of asthma.  Past Medical History:  Diagnosis Date   Allergy    tx. occ. with OTC meds   Arthritis    feet, back, hips   Asthma    has been controlled, not rescue inhaler at present   Carpal tunnel syndrome    Complication of anesthesia    coming out of anesthesia with D & C-combative,not breathing, when allowed to come out slower, this was not an issue   Depression    Endometriosis of ovary    more resolved since hysterectomy   GERD (gastroesophageal reflux disease)    Headache(784.0)    hx. migraines, none in 5 yrs   Heart murmur    Hiatal hernia    Hidradenitis suppurativa    followed by [facility]    MRSA carrier    Obesity    Plantar fasciitis 2009   PONV (postoperative nausea and vomiting)    Psoriatic arthritis (HCC)    followed by Dr. Orlin Hilding at [facility]     Patient Active Problem List   Diagnosis Date Noted   B12 deficiency 04/08/2022   Menopausal symptoms 01/10/2022   Vitamin D deficiency 10/28/2020   History of hysterectomy 10/28/2020   Psoriatic arthritis (HCC)    Hidradenitis suppurativa 02/24/2017   S/P laparoscopic sleeve gastrectomy+hiatus hernia repair Nov 2014 07/03/2013   GERD (gastroesophageal reflux disease) 04/25/2013     Past Surgical History:  Procedure Laterality Date   ANKLE FRACTURE SURGERY Left 04/06/2020   hardware put in due to break   CHOLECYSTECTOMY     laparoscopic   DIAGNOSTIC LAPAROSCOPY  8/96   lysis/endometriosis (LKS)   DILATION AND CURETTAGE OF UTERUS     LAPAROSCOPIC GASTRIC SLEEVE RESECTION N/A 07/03/2013   Procedure: LAPAROSCOPIC GASTRIC SLEEVE RESECTION;  Surgeon: Valarie Merino, MD;  Location: [facility];  Service: General;  Laterality: N/A;   LAPAROSCOPIC TOTAL HYSTERECTOMY  07/2010   robotic assisted   SHOULDER SURGERY Left 04/04/2018   scar tissue    TONSILLECTOMY     UPPER GI ENDOSCOPY  07/03/2013   Procedure: UPPER GI ENDOSCOPY;  Surgeon: Valarie Merino, MD;  Location: [facility];  Service: General;;    OB History     Gravida  3   Para  2   Term      Preterm      AB      Living  2      SAB      IAB      Ectopic      Multiple      Live Births               Home Medications    Prior to Admission medications   Medication Sig Start  Date End Date Taking? Authorizing Provider  ZORYVE 0.3 % CREA Apply topically. 12/05/23  Yes [provider]  albuterol (VENTOLIN HFA) 108 (90 Base) MCG/ACT inhaler Inhale 2 puffs into the lungs every 6 (six) hours as needed for wheezing or shortness of breath. 10/14/23   Alfonse Spruce, MD  cyanocobalamin (VITAMIN B12) 1000 MCG/ML injection Inject 1,000 mcg into the muscle once. 03/12/22   [provider]  estradiol (CLIMARA - DOSED IN MG/24 HR) 0.05 mg/24hr patch Place 1 patch (0.05 mg total) onto the skin once a week. 12/06/22   Jerene Bears, MD  golimumab (SIMPONI ARIA) 50 MG/4ML SOLN injection Infuse 2mg /kg Intravenous every 8 weeks    [provider]  levocetirizine (XYZAL) 5 MG tablet Take 1 tablet (5 mg total) by mouth every evening. 10/14/23   Alfonse Spruce, MD  montelukast (SINGULAIR) 10 MG tablet Take 1 tablet (10 mg total) by mouth at bedtime. 10/14/23   Alfonse Spruce, MD  Multiple Vitamins-Minerals (MULTIVITAMIN ADULT) CHEW Chew by mouth. Patient not taking: Reported on 10/14/2023    [provider]    Family History Family History  Problem Relation Age of Onset   Heart disease Mother    Hyperlipidemia Mother    Hypertension Mother    Diabetes Maternal Grandmother    Hypertension Maternal Grandmother    Heart disease Maternal Grandmother    Breast cancer Other        maternal great aunt   Heart disease Maternal Grandfather     Social History Social History   Tobacco Use   Smoking status: Never    Passive exposure: Never   Smokeless tobacco: Never  Vaping Use   Vaping status: Never Used  Substance Use Topics   Alcohol use: No    Alcohol/week: 0.0 standard drinks of alcohol   Drug use: No     Allergies   Iodinated contrast media, Lactose intolerance (gi), Shellfish allergy, Sulfa antibiotics, Whey protein [protein], Erythromycin, and Sudafed [pseudoephedrine]   Review of Systems Review of Systems Per HPI  Physical Exam Triage Vital Signs ED Triage Vitals  Encounter Vitals Group     BP 12/07/23 1538 120/84     Systolic BP Percentile --      Diastolic BP Percentile --      Pulse Rate 12/07/23 1538 67     Resp 12/07/23 1538 20     Temp 12/07/23 1538 98.3 F (36.8 C)     Temp Source 12/07/23 1538 Oral     SpO2 12/07/23 1538 96 %     Weight --      Height --      Head Circumference --      Peak Flow --      Pain Score 12/07/23 1535 5     Pain Loc --      Pain Education --      Exclude from Growth Chart --    No data found.  Updated Vital Signs BP 120/84 (BP Location: Right Arm)   Pulse 67   Temp 98.3 F (36.8 C) (Oral)   Resp 20   SpO2 96%   Visual Acuity Right Eye Distance:   Left Eye Distance:   Bilateral Distance:    Right Eye Near:   Left Eye Near:    Bilateral Near:     Physical Exam Vitals and nursing note reviewed.  Constitutional:      General: She is not in acute distress.  Appearance: Normal appearance.  HENT:     Head: Normocephalic.     Right Ear: Ear canal and external ear normal. No drainage or swelling. A middle ear effusion is present.     Left Ear: Tympanic membrane, ear canal and external ear normal.     Mouth/Throat:     Mouth: Mucous membranes are moist.  Eyes:     Extraocular Movements: Extraocular movements intact.     Pupils: Pupils are equal, round, and reactive to light.  Cardiovascular:     Rate and Rhythm: Normal rate and regular rhythm.     Pulses: Normal pulses.     Heart sounds: Normal heart sounds.  Pulmonary:     Effort: Pulmonary effort is normal.     Breath sounds: Normal breath sounds.  Abdominal:     General: Bowel sounds are normal.     Palpations: Abdomen is soft.  Musculoskeletal:     Cervical back: Normal range of motion.  Lymphadenopathy:     Cervical: No cervical adenopathy.  Skin:    General: Skin is warm and dry.  Neurological:     General: No focal deficit present.     Mental Status: She is alert and oriented to person, place, and time.  Psychiatric:        Mood and Affect: Mood normal.        Behavior: Behavior normal.      UC Treatments / Results  Labs (all labs ordered are listed, but only abnormal results are displayed) Labs Reviewed - No data to display  EKG   Radiology No results found.  Procedures Procedures (including critical care time)  Medications Ordered in UC Medications - No data to display  Initial Impression / Assessment and Plan / UC Course  I have reviewed the triage vital signs and the nursing notes.  Pertinent labs & imaging results that were available during my care of the patient were reviewed by me and considered in my medical decision making (see chart for details).  On exam, patient noted to have a right middle ear effusion.  Symptoms consistent with right eustachian tube dysfunction.  Patient is currently taking levocetirizine and Singulair, will start prednisone 40  mg for the next 5 days and fluticasone 50 mcg nasal spray for eustachian tube inflammation and swelling.  Supportive care recommendations were provided and discussed with the patient to include continuing current allergy medications, over-the-counter analgesics, and warm compresses to the affected ear.  Patient was given indications regarding follow-up.  Patient was in agreement with this plan of care and verbalized understanding.  All questions were answered.  Patient stable for discharge.   Final Clinical Impressions(s) / UC Diagnoses   Final diagnoses:  None   Discharge Instructions   None    ED Prescriptions   None    PDMP not reviewed this encounter.   Hardy Lia, NP 12/07/23 [phone]

## 2023-12-07 NOTE — Discharge Instructions (Signed)
 Take medication as prescribed. May take Tylenol or Ibuprofen for pain, fever, or general discomfort. Warm compresses to the affected ear help with comfort. Do not stick anything inside the ear while symptoms persist. Avoid getting water inside of the ear while symptoms persist. Continue Levocetirizine and Singulair as prescribed. If symptoms fail to improve, recommend following up with your PCP for further evaluation. Follow-up as needed.

## 2023-12-16 ENCOUNTER — Encounter (HOSPITAL_BASED_OUTPATIENT_CLINIC_OR_DEPARTMENT_OTHER): Payer: Self-pay | Admitting: Obstetrics & Gynecology

## 2023-12-16 ENCOUNTER — Ambulatory Visit (HOSPITAL_BASED_OUTPATIENT_CLINIC_OR_DEPARTMENT_OTHER): Payer: 59 | Admitting: Obstetrics & Gynecology

## 2023-12-16 VITALS — BP 101/82 | HR 90 | Ht 63.98 in | Wt 168.6 lb

## 2023-12-16 DIAGNOSIS — L4050 Arthropathic psoriasis, unspecified: Secondary | ICD-10-CM

## 2023-12-16 DIAGNOSIS — N951 Menopausal and female climacteric states: Secondary | ICD-10-CM

## 2023-12-16 DIAGNOSIS — E559 Vitamin D deficiency, unspecified: Secondary | ICD-10-CM

## 2023-12-16 DIAGNOSIS — Z9884 Bariatric surgery status: Secondary | ICD-10-CM | POA: Diagnosis not present

## 2023-12-16 DIAGNOSIS — E538 Deficiency of other specified B group vitamins: Secondary | ICD-10-CM

## 2023-12-16 DIAGNOSIS — R5382 Chronic fatigue, unspecified: Secondary | ICD-10-CM

## 2023-12-16 DIAGNOSIS — Z1211 Encounter for screening for malignant neoplasm of colon: Secondary | ICD-10-CM

## 2023-12-16 DIAGNOSIS — Z90710 Acquired absence of both cervix and uterus: Secondary | ICD-10-CM

## 2023-12-16 DIAGNOSIS — K9089 Other intestinal malabsorption: Secondary | ICD-10-CM

## 2023-12-16 DIAGNOSIS — Z01419 Encounter for gynecological examination (general) (routine) without abnormal findings: Secondary | ICD-10-CM | POA: Diagnosis not present

## 2023-12-16 MED ORDER — ESTRADIOL 0.05 MG/24HR TD PTWK: 0.0500 mg | MEDICATED_PATCH | TRANSDERMAL | 3 refills | Status: AC

## 2023-12-16 NOTE — Progress Notes (Signed)
 ANNUAL EXAM Patient name: Mary Davenport MRN [id number]  Date of birth: [birth year] Chief Complaint:   Annual Exam  History of Present Illness:   Mary Davenport is a 48 y.o. G3P2 Caucasian female being seen today for a routine annual exam.  Having 30 year anniversary.    Did [facility] genetic testing 08/2023.  Reviewed with pt.    Discussed vaccines today including Tdap, pneumonia, and shingrix.    No LMP recorded. Patient has had a hysterectomy.  Last pap 02/24/2017. Results were:  negative . H/O abnormal pap: no Last mammogram: 12/08/2023 . Results were: normal. Family h/o breast cancer: yes :  Maternal Great Aunt Last colonoscopy:  declined.  Cologuard negative 10/2020.       12/16/2023   10:01 AM 12/06/2022    8:53 AM 01/07/2022    4:23 PM 11/24/2021    3:50 PM  Depression screen PHQ 2/9  Decreased Interest 0 0 0 0  Down, Depressed, Hopeless 0 0 0 0  PHQ - 2 Score 0 0 0 0    Review of Systems:   Pertinent items are noted in HPI  Denies any urinary change or bowel changes.  Denies vaginal bleeding.   Pertinent History Reviewed:  Reviewed past medical,surgical, social and family history.  Reviewed problem list, medications and allergies. Physical Assessment:   Vitals:   12/16/23 0958  BP: 101/82  Pulse: 90  Weight: 168 lb 9.6 oz (76.5 kg)  Height: 5' 3.98" (1.625 m)  Body mass index is 28.96 kg/m.        Physical Examination:   General appearance - well appearing, and in no distress  Mental status - alert, oriented to person, place, and time  Psych:  She has a normal mood and affect  Skin - warm and dry, normal color, no suspicious lesions noted  Chest - effort normal, all lung fields clear to auscultation bilaterally  Heart - normal rate and regular rhythm  Neck:  midline trachea, no thyromegaly or nodules  Breasts - breasts appear normal, no suspicious masses, no skin or nipple changes or  axillary nodes  Abdomen - soft, nontender,  nondistended, no masses or organomegaly  Pelvic - VULVA: normal appearing vulva with no masses, tenderness or lesions  VAGINA: normal appearing vagina with normal color and discharge, no lesions  CERVIX: normal appearing cervix without discharge or lesions, no CMT  Thin prep pap is not done   UTERUS: uterus is felt to be normal size, shape, consistency and nontender   ADNEXA: No adnexal masses or tenderness noted.  Rectal - normal rectal, good sphincter tone, no masses felt.   Extremities:  No swelling or varicosities noted  Chaperone present for exam  Assessment & Plan:  1. Well woman exam with routine gynecological exam (Primary) - Pap smear not indicated - Mammogram up to date - Colonoscopy declined.  Cologuard ordered. - Bone mineral density guidelines reviewed - lab work done ordered as per below - vaccines reviewed/updated  2. Colon cancer screening - Cologuard  3. Menopausal symptoms - estradiol  (CLIMARA  - DOSED IN MG/24 HR) 0.05 mg/24hr patch; Place 1 patch (0.05 mg total) onto the skin once a week.  Dispense: 12 patch; Refill: 3  4. Psoriatic arthritis (HCC) - followed by Dr. Meredith Stalls  5. 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 History of hysterectomy  7. B12 deficiency - Vitamin B12  8. Vitamin D  deficiency - VITAMIN D  25 Hydroxy (Vit-D Deficiency, Fractures)  9.  Chronic fatigue - TSH   Orders Placed This Encounter  Procedures   Cologuard   VITAMIN D  25 Hydroxy (Vit-D Deficiency, Fractures)   Vitamin B12   TSH    Meds:  Meds ordered this encounter  Medications   estradiol  (CLIMARA  - DOSED IN MG/24 HR) 0.05 mg/24hr patch    Sig: Place 1 patch (0.05 mg total) onto the skin once a week.    Dispense:  12 patch    Refill:  3    Follow-up: Return in about 1 year (around 12/15/2024).  Lillian Rein, MD 12/16/2023 10:57 AM

## 2023-12-17 LAB — VITAMIN D 25 HYDROXY (VIT D DEFICIENCY, FRACTURES): Vit D, 25-Hydroxy: 21 ng/mL — ABNORMAL LOW (ref 30.0–100.0)

## 2023-12-17 LAB — TSH: TSH: 0.832 u[IU]/mL (ref 0.450–4.500)

## 2023-12-17 LAB — VITAMIN B12: Vitamin B-12: 736 pg/mL (ref 232–1245)

## 2023-12-22 ENCOUNTER — Encounter (HOSPITAL_BASED_OUTPATIENT_CLINIC_OR_DEPARTMENT_OTHER): Payer: Self-pay | Admitting: Obstetrics & Gynecology

## 2023-12-22 MED ORDER — VITAMIN D (ERGOCALCIFEROL) 1.25 MG (50000 UNIT) PO CAPS: 50000.0000 [IU] | ORAL_CAPSULE | ORAL | 0 refills | Status: DC

## 2023-12-22 NOTE — Addendum Note (Signed)
 Addended by: Lillian Rein on: 12/22/2023 09:02 AM   Modules accepted: Orders

## 2024-01-09 LAB — COLOGUARD: COLOGUARD: NEGATIVE

## 2024-01-17 ENCOUNTER — Ambulatory Visit (HOSPITAL_BASED_OUTPATIENT_CLINIC_OR_DEPARTMENT_OTHER): Payer: Self-pay | Admitting: Obstetrics & Gynecology

## 2024-02-10 ENCOUNTER — Other Ambulatory Visit: Payer: Self-pay

## 2024-02-10 ENCOUNTER — Ambulatory Visit
Admission: RE | Admit: 2024-02-10 | Discharge: 2024-02-10 | Disposition: A | Discharge status: Home / Self Care | Source: Ambulatory Visit | Attending: Nurse Practitioner | Admitting: Nurse Practitioner

## 2024-02-10 VITALS — BP 118/80 | HR 62 | Temp 98.6°F | Resp 20

## 2024-02-10 DIAGNOSIS — H9201 Otalgia, right ear: Secondary | ICD-10-CM

## 2024-02-10 DIAGNOSIS — H748X1 Other specified disorders of right middle ear and mastoid: Secondary | ICD-10-CM | POA: Diagnosis not present

## 2024-02-10 MED ORDER — AMOXICILLIN-POT CLAVULANATE 875-125 MG PO TABS: 1.0000 | ORAL_TABLET | Freq: Two times a day (BID) | ORAL | 0 refills | Status: AC

## 2024-02-10 NOTE — Discharge Instructions (Addendum)
 Take medication as prescribed. You may take over-the-counter Tylenol  or ibuprofen as needed for pain or discomfort. Apply warm compresses to the affected area to help with pain or discomfort. Avoid sticking anything inside of the ear while symptoms persist to include Bobby pins or Q-tips. Avoid entrance of water inside of the ear while symptoms persist. If symptoms fail to improve with this treatment over the next several weeks, you may follow-up in this clinic or with your primary care physician for further evaluation. Follow-up as needed.

## 2024-02-10 NOTE — ED Triage Notes (Signed)
 Pt reports possible right ear drum rupture since last night.denies any known injury or foreign body.

## 2024-02-10 NOTE — ED Provider Notes (Signed)
 [facility]    CSN: [id number] Arrival date & time: 02/10/24  1147      History   Chief Complaint Chief Complaint  Patient presents with   Ear Injury    Ear infection or ear drum rupture - Entered by patient    HPI Mary Davenport is a 48 y.o. female.   The history is provided by the patient.   Patient presents with a 1 day history of right ear pain.  Patient states that she is experiencing a constant pain in her ear, rates pain 4/10 at present.  Patient states that she also noticed bloody drainage inside of the ear canal with the special device she used to look inside of her ear with.  Patient denies fever, chills, headache, ear drainage, decreased hearing, or dizziness.  She has not taken any medication for her symptoms.  Past Medical History:  Diagnosis Date   Allergy     tx. occ. with OTC meds   Arthritis    feet, back, hips   Asthma    has been controlled, not rescue inhaler at present   Carpal tunnel syndrome    Complication of anesthesia    coming out of anesthesia with D & C-combative,not breathing, when allowed to come out slower, this was not an issue   Depression    Endometriosis of ovary    more resolved since hysterectomy   GERD (gastroesophageal reflux disease)    Headache(784.0)    hx. migraines, none in 5 yrs   Heart murmur    Hiatal hernia    Hidradenitis suppurativa    followed by [facility] & [facility]    MRSA carrier    Obesity    Plantar fasciitis 2009   PONV (postoperative nausea and vomiting)    Psoriatic arthritis (HCC)    followed by Dr. Dannie Duval at [facility]     Patient Active Problem List   Diagnosis Date Noted   B12 deficiency 04/08/2022   Menopausal symptoms 01/10/2022   Vitamin D  deficiency 10/28/2020   History of hysterectomy 10/28/2020   Psoriatic arthritis (HCC)    Hidradenitis suppurativa 02/24/2017   S/P laparoscopic sleeve gastrectomy+hiatus hernia repair Nov  2014 07/03/2013   GERD (gastroesophageal reflux disease) 04/25/2013    Past Surgical History:  Procedure Laterality Date   ANKLE FRACTURE SURGERY Left 04/06/2020   hardware put in due to break   CHOLECYSTECTOMY     laparoscopic   DIAGNOSTIC LAPAROSCOPY  8/96   lysis/endometriosis ([facility])   DILATION AND CURETTAGE OF UTERUS     LAPAROSCOPIC GASTRIC SLEEVE RESECTION N/A 07/03/2013   Procedure: LAPAROSCOPIC GASTRIC SLEEVE RESECTION;  Surgeon: Azucena Bollard, MD;  Location: [facility];  Service: General;  Laterality: N/A;   LAPAROSCOPIC TOTAL HYSTERECTOMY  07/2010   robotic assisted   SHOULDER SURGERY Left 04/04/2018   scar tissue    TONSILLECTOMY     UPPER GI ENDOSCOPY  07/03/2013   Procedure: UPPER GI ENDOSCOPY;  Surgeon: Azucena Bollard, MD;  Location: [facility];  Service: General;;    OB History     Gravida  3   Para  2   Term      Preterm      AB      Living  2      SAB      IAB      Ectopic      Multiple      Live Births  Home Medications    Prior to Admission medications   Medication Sig Start Date End Date Taking? Authorizing Provider  amoxicillin -clavulanate (AUGMENTIN ) 875-125 MG tablet Take 1 tablet by mouth every 12 (twelve) hours. 02/10/24  Yes Leath-Warren, Belen Bowers, NP  albuterol  (VENTOLIN  HFA) 108 (90 Base) MCG/ACT inhaler Inhale 2 puffs into the lungs every 6 (six) hours as needed for wheezing or shortness of breath. 10/14/23   Rochester Chuck, MD  cyanocobalamin  (VITAMIN B12) 1000 MCG/ML injection Inject 1,000 mcg into the muscle once. Patient not taking: Reported on 12/16/2023 03/12/22   [provider]  estradiol  (CLIMARA  - DOSED IN MG/24 HR) 0.05 mg/24hr patch Place 1 patch (0.05 mg total) onto the skin once a week. 12/16/23   Lillian Rein, MD  fluticasone  (FLONASE ) 50 MCG/ACT nasal spray Place 2 sprays into both nostrils daily. 12/07/23   Leath-Warren, Belen Bowers, NP  golimumab (SIMPONI ARIA) 50 MG/4ML SOLN  injection Infuse 2mg /kg Intravenous every 8 weeks    [provider]  levocetirizine (XYZAL ) 5 MG tablet Take 1 tablet (5 mg total) by mouth every evening. 10/14/23   Rochester Chuck, MD  montelukast  (SINGULAIR ) 10 MG tablet Take 1 tablet (10 mg total) by mouth at bedtime. 10/14/23   Rochester Chuck, MD  Multiple Vitamins-Minerals (MULTIVITAMIN ADULT) CHEW Chew by mouth. Patient not taking: Reported on 12/16/2023    [provider]  Vitamin D , Ergocalciferol , (DRISDOL ) 1.25 MG (50000 UNIT) CAPS capsule Take 1 capsule (50,000 Units total) by mouth every 7 (seven) days. 12/22/23   Lillian Rein, MD  ZORYVE 0.3 % CREA Apply topically. 12/05/23   [provider]    Family History Family History  Problem Relation Age of Onset   Heart disease Mother    Hyperlipidemia Mother    Hypertension Mother    Diabetes Maternal Grandmother    Hypertension Maternal Grandmother    Heart disease Maternal Grandmother    Breast cancer Other        maternal great aunt   Heart disease Maternal Grandfather     Social History Social History   Tobacco Use   Smoking status: Never    Passive exposure: Never   Smokeless tobacco: Never  Vaping Use   Vaping status: Never Used  Substance Use Topics   Alcohol use: No    Alcohol/week: 0.0 standard drinks of alcohol   Drug use: No     Allergies   Iodinated contrast media, Lactose intolerance (gi), Shellfish allergy , Sulfa antibiotics, Whey protein [protein], Erythromycin, and Sudafed [pseudoephedrine]   Review of Systems Review of Systems Per HPI  Physical Exam Triage Vital Signs ED Triage Vitals  Encounter Vitals Group     BP 02/10/24 1157 118/80     Girls Systolic BP Percentile --      Girls Diastolic BP Percentile --      Boys Systolic BP Percentile --      Boys Diastolic BP Percentile --      Pulse Rate 02/10/24 1157 62     Resp 02/10/24 1157 20     Temp 02/10/24 1157 98.6 F (37 C)     Temp Source  02/10/24 1157 Oral     SpO2 02/10/24 1157 98 %     Weight --      Height --      Head Circumference --      Peak Flow --      Pain Score 02/10/24 1156 2     Pain Loc --  Pain Education --      Exclude from Growth Chart --    No data found.  Updated Vital Signs BP 118/80 (BP Location: Right Arm)   Pulse 62   Temp 98.6 F (37 C) (Oral)   Resp 20   SpO2 98%   Visual Acuity Right Eye Distance:   Left Eye Distance:   Bilateral Distance:    Right Eye Near:   Left Eye Near:    Bilateral Near:     Physical Exam Vitals and nursing note reviewed.  Constitutional:      General: She is not in acute distress.    Appearance: Normal appearance.  HENT:     Head: Normocephalic.     Right Ear: Hearing, ear canal and external ear normal. Tenderness (right ear canal) present. There is hemotympanum.     Ears:     Comments: Moderate blood pooled at the right TM. Difficult to visualize perforation. Ear canal is tender.    Eyes:     Extraocular Movements: Extraocular movements intact.     Pupils: Pupils are equal, round, and reactive to light.    Cardiovascular:     Rate and Rhythm: Normal rate and regular rhythm.     Pulses: Normal pulses.     Heart sounds: Normal heart sounds.  Pulmonary:     Effort: Pulmonary effort is normal. No respiratory distress.     Breath sounds: Normal breath sounds. No stridor. No wheezing, rhonchi or rales.   Musculoskeletal:     Cervical back: Normal range of motion.   Skin:    General: Skin is warm and dry.   Neurological:     General: No focal deficit present.     Mental Status: She is alert and oriented to person, place, and time.   Psychiatric:        Mood and Affect: Mood normal.        Behavior: Behavior normal.      UC Treatments / Results  Labs (all labs ordered are listed, but only abnormal results are displayed) Labs Reviewed - No data to display  EKG   Radiology No results found.  Procedures Procedures (including  critical care time)  Medications Ordered in UC Medications - No data to display  Initial Impression / Assessment and Plan / UC Course  I have reviewed the triage vital signs and the nursing notes.  Pertinent labs & imaging results that were available during my care of the patient were reviewed by me and considered in my medical decision making (see chart for details).  Patient presents for complaints of right ear pain. On exam, patient with moderate blood pooled immediately at the right tympanic membrane.  Difficult to visualize for perforation.  Will treat prophylactically with Augmentin  875/125 mg tablets to cover for infection.  Supportive care recommendations were provided and discussed with the patient to include over-the-counter analgesics, warm compresses to the ear, and to avoid entrance of water inside of the ear while symptoms persist.  Patient was given indications regarding follow-up.  Patient was in agreement with this plan of care and verbalizes understanding.  All questions were answered.  Patient stable for discharge.  Final Clinical Impressions(s) / UC Diagnoses   Final diagnoses:  Acute otalgia, right  Hemotympanum, right     Discharge Instructions      Take medication as prescribed. You may take over-the-counter Tylenol  or ibuprofen as needed for pain or discomfort. Apply warm compresses to the affected area to help with  pain or discomfort. Avoid sticking anything inside of the ear while symptoms persist to include Bobby pins or Q-tips. Avoid entrance of water inside of the ear while symptoms persist. If symptoms fail to improve with this treatment over the next several weeks, you may follow-up in this clinic or with your primary care physician for further evaluation. Follow-up as needed.     ED Prescriptions     Medication Sig Dispense Auth. Provider   amoxicillin -clavulanate (AUGMENTIN ) 875-125 MG tablet Take 1 tablet by mouth every 12 (twelve) hours. 14  tablet Leath-Warren, Belen Bowers, NP      PDMP not reviewed this encounter.   Hardy Lia, NP 02/10/24 1235

## 2024-03-01 ENCOUNTER — Encounter: Payer: Self-pay | Admitting: Internal Medicine

## 2024-03-01 ENCOUNTER — Ambulatory Visit: Admitting: Internal Medicine

## 2024-03-01 VITALS — BP 112/72 | HR 64 | Temp 98.0°F | Ht 63.98 in | Wt 159.6 lb

## 2024-03-01 DIAGNOSIS — H6991 Unspecified Eustachian tube disorder, right ear: Secondary | ICD-10-CM | POA: Diagnosis not present

## 2024-03-01 DIAGNOSIS — E538 Deficiency of other specified B group vitamins: Secondary | ICD-10-CM | POA: Diagnosis not present

## 2024-03-01 DIAGNOSIS — H7291 Unspecified perforation of tympanic membrane, right ear: Secondary | ICD-10-CM

## 2024-03-01 DIAGNOSIS — K9089 Other intestinal malabsorption: Secondary | ICD-10-CM | POA: Diagnosis not present

## 2024-03-01 DIAGNOSIS — K909 Intestinal malabsorption, unspecified: Secondary | ICD-10-CM | POA: Insufficient documentation

## 2024-03-01 DIAGNOSIS — M25522 Pain in left elbow: Secondary | ICD-10-CM | POA: Insufficient documentation

## 2024-03-01 DIAGNOSIS — E739 Lactose intolerance, unspecified: Secondary | ICD-10-CM

## 2024-03-01 DIAGNOSIS — L4050 Arthropathic psoriasis, unspecified: Secondary | ICD-10-CM

## 2024-03-01 MED ORDER — AMOXICILLIN-POT CLAVULANATE 875-125 MG PO TABS: 1.0000 | ORAL_TABLET | Freq: Two times a day (BID) | ORAL | 0 refills | Status: AC

## 2024-03-01 MED ORDER — CELECOXIB 100 MG PO CAPS: 100.0000 mg | ORAL_CAPSULE | Freq: Two times a day (BID) | ORAL | 2 refills | Status: AC

## 2024-03-01 NOTE — Patient Instructions (Addendum)
 ALLERGY  MANAGEMENT PLAN  This plan is designed to help manage your allergic rhinitis (nasal allergies) effectively. Follow these steps daily for best results.  Sinus saline sprays- use nightly, and after sneezing episodes or exposure to allergen.  Insert deeply and spray mist into nose while leaning over sink at 45 degrees,  while gently breathing. Also blow out onto tissue while leaning forward 45 degrees. Once daily, after a sinus rinse, use sensimist.  Just before bedtime is best. This only needed if allergies acting up.  If this is inadequate add-on once daily for levocetirizine / xyzal  5 mg for nondrowsy antihistamine Take benadryl 25 mg at bedtime also if allergic mucus is persisting  When allergies cause chronic swelling in sinuses, it leads to sinus infections:    DAILY TREATMENT ROUTINE   Time of Day Treatment Steps  Morning 1. Saline Nasal Spray - Use to cleanse nasal passages 2. Xyzal  (levocetirizine) - Take one tablet daily   Throughout Day Saline Nasal Spray - Use 2 additional times (mid-day and afternoon)   Evening/Bedtime 1. Saline Nasal Rinse - Thoroughly clean nasal passages 2. Flonase  Sensimist - Apply after nasal rinse 3. Benadryl (diphenhydramine) - Take 25mg  if experiencing persistent congestion    PROPER TECHNIQUE GUIDE       Saline Nasal Spray/Rinse Technique: Lean forward over sink at a 45-degree angle Turn head slightly to one side Insert spray tip into upper nostril Spray gently while breathing lightly through your nose Repeat on other side Gently blow nose to clear excess solution Use saline spray 3 times daily to keep nasal passages moist and clear allergens.       Flonase  Sensimist Technique: Shake bottle gently before each use Prime the bottle if it's new or hasn't been used for a week Tilt your head forward slightly Insert tip into nostril, pointing away from the center of your nose Spray while inhaling gently Repeat in other nostril Use  Flonase  Sensimist once daily, preferably at bedtime after using saline rinse. It may take several days of regular use to feel maximum benefit.   WHY FLONASE  SENSIMIST?   Benefits of Flonase  Sensimist:  Alcohol-free and scent-free formula - gentler on sensitive nasal passages Fine mist application - more comfortable with less dripping down throat Effectively relieves nasal congestion, sneezing, runny nose, and even eye symptoms 24-hour relief with once-daily dosing Uses a more potent form of fluticasone  that works at a lower dose Less liquid per spray means less discomfort  UNDERSTANDING YOUR MEDICATIONS   Medication How It Works Important Notes  Flonase  Sensimist (fluticasone  furoate) Reduces inflammation in nasal passages, addressing the underlying cause of allergy  symptoms - Takes several days for full effect - Use daily for best results - Safe for long-term use   Xyzal  (levocetirizine) Blocks histamine to reduce allergy  symptoms like sneezing and itching - Take at the same time each day - May cause drowsiness in some people - Once-daily dosing   Benadryl (diphenhydramine) Antihistamine that provides additional relief for breakthrough symptoms - Causes drowsiness - Use only at bedtime - For occasional use when needed   Saline Spray/Rinse Physically removes allergens and moistens nasal passages - Safe to use frequently - Improves effectiveness of other treatments - Reduces nasal irritation    CONTACT YOUR PROVIDER IF: Your symptoms do not improve after 1-2 weeks of following this plan You develop sinus pain with fever or green/yellow discharge You experience frequent nosebleeds You develop new or worsening symptoms You have questions about your treatment plan  ADDITIONAL ALLERGY  MANAGEMENT TIPS   HELPFUL STRATEGIES: ?? Keep windows closed during high pollen seasons ??? Use allergen-proof covers for pillows and mattresses ?? Vacuum regularly with a HEPA filter  vacuum ?? Shower and change clothes after spending time outdoors ?? Check local pollen counts and limit outdoor time when counts are high ?? Stay well-hydrated to help keep mucous membranes moist     eustachian tube dysfunction Understanding & Managing Eustachian Tube Dysfunction  Your Eustachian Tube System The Eustachian tube is a vital connection between your middle ear and nasopharynx (upper throat). This small but crucial canal serves three main purposes:  Equalizes air pressure between your ear and environment Drains fluid from your middle ear Protects against pathogens entering from your throat  Daily Care Routine Nasal Care Use sterile saline nasal mist in each nostril while leaning forward over sink. Blow nose while leaning forward over sink. Follow with nasal steroid spray Best performed before bedtime Allergy  Management Take prescribed allergy  medications regularly Use non-drowsy options during the day Consider Benadryl for nighttime only Safe Pressure Relief Methods Basic Methods Swallowing Yawning Chewing gum Warm compress application Toynbee Maneuver Close your mouth firmly Pinch your nose closed Swallow Repeat as needed Frenzel Maneuver Pinch nose closed Place tongue tip behind upper teeth Press tongue back/up against roof of mouth Make a K sound in your throat Medication Guide Decongestants Types: Sudafed (pseudoephedrine), afrin sprays Duration: 3-5 days maximum Warning: May increase blood pressure, can be difficult to discontinue if taken long term When to Seek Medical Care Contact your healthcare provider if you experience: Persistent symptoms despite treatment Severe ear pain Hearing loss Dizziness Fever  Dietary Guidance 1. General Principles (Post-Sleeve Gastrectomy) Small, frequent meals: 5-6 per day to maximize absorption and minimize discomfort. High protein: Aim for 60-80 grams/day (lean meats, poultry, eggs, tofu, legumes). Chew  thoroughly: Prevents obstruction and enhances digestion. Hydration: 1.5-2 liters/day, but avoid drinking 30 minutes before/after meals. Avoid high-sugar/high-fat foods: Prevents dumping syndrome. 2. Lactose Intolerance Avoid: Milk, soft cheeses, ice cream, cream-based soups. Choose: Lactose-free dairy, hard cheeses (e.g., cheddar), yogurt (often tolerated), plant-based milks (soy, almond, oat). Calcium: Ensure adequate intake from lactose-free or plant-based sources. 3. Shellfish Allergy  Strict avoidance: No shellfish (shrimp, crab, lobster, clams, mussels, oysters, scallops). Check labels: Many processed foods, sauces, and supplements may contain shellfish-derived ingredients. Cross-contamination: Be cautious when eating out.  Vitamin & Mineral Supplementation (Post-Sleeve Gastrectomy) Lifelong supplementation is recommended due to malabsorption risks. Choose supplements free from shellfish and lactose. Recommended Daily Supplements Supplement Dose/Notes  Multivitamin 1-2 adult chewable or liquid multivitamins daily (no shellfish/lactose; with iron, zinc, copper)  Calcium Citrate 1200-1500 mg/day in divided doses (calcium citrate preferred, not carbonate; avoid taking with iron)  Vitamin D  3000 IU/day (or as needed to maintain 25(OH)D >30 ng/mL; can be combined with calcium)  Vitamin B12 350-500 mcg/day orally or 1000 mcg/month intramuscularly (monitor levels)  Iron 45-60 mg elemental iron/day (separate from calcium, use non-heme iron if vegetarian)  Others Consider additional zinc, copper, and folic acid  if not present in multivitamin  Monitor labs (iron studies, vitamin D , B12, calcium, PTH) at least annually.  Summary Table Issue Dietary Guidance/Note  Sleeve Gastrectomy Small, frequent, high-protein meals; lifelong vitamin supplements  Lactose Intolerance Use lactose-free/plant-based dairy; ensure adequate calcium intake  Shellfish Allergy  Strict avoidance; check labels; no  shellfish-derived supplements  Age/Sex Ensure postmenopausal calcium and vitamin D  needs are met     Certainly! Here's a focused After Visit Summary (AVS) for a patient with a  tympanic membrane rupture, tailored for safe care until she sees ENT. This version includes the relevant dietary/vitamin recommendations from above and is written for patient understanding.  After Visit Summary (AVS) Patient: [Name] DOB: [DOB] Date: [Today's Date] Reason for Visit: Ear drum (tympanic membrane) rupture  Ear Care Instructions Keep your ear dry: Do not let water or soap get into the affected ear. Avoid swimming and use a shower cap or cotton ball coated with petroleum jelly to protect your ear during showers. Do not use ear drops, Q-tips, or any objects in your ear unless prescribed by your doctor. Pain control: You may use acetaminophen  (Tylenol ) or ibuprofen as needed for pain, unless otherwise directed. Monitor for symptoms: Call the clinic or seek care if you develop: Fever over 100.9F (38C) Worsening ear pain Pus or foul-smelling drainage from the ear Hearing loss that is getting worse Dizziness or severe headache Follow-up: You have been referred to an ear, nose, and throat (ENT) specialist for further evaluation and management. Keep your appointment and bring this summary with you.  Diet and Vitamins (for your history of sleeve surgery, lactose intolerance, and shellfish allergy ) Eat small, frequent meals (5-6 per day). Focus on high-protein foods such as poultry, eggs, tofu, and beans. Choose lactose-free or plant-based dairy (like almond or soy milk) to get enough calcium. Strictly avoid shellfish and check food and supplement labels carefully. Take your daily vitamins:  Chewable multivitamin (with iron, zinc, copper--no shellfish/lactose) Calcium citrate 1200-1500 mg daily (split into 2-3 doses) Vitamin D  3000 IU daily Vitamin B12 350-500 mcg daily (or as prescribed) Iron 45-60  mg daily (if not in multivitamin) Lab monitoring: Keep up with your routine bloodwork as recommended by your doctor.  When to Seek Immediate Care Severe pain, sudden hearing loss, or new dizziness High fever Drainage that is thick, yellow, green, or foul-smelling Trouble moving your face or severe headache  Questions or concerns? Call our office at [facility] phone number]. Next steps: Wait for your ENT appointment for further evaluation of your ear. Continue to protect your ear and follow the above instructions.  Thank you for your visit!  Welcome aboard!   Today's visit was a valuable first step in understanding your health and starting your personalized care journey. We discussed your medical history and medications in detail. Given the extensive information, we prioritized addressing your most pressing concerns.  We understood those concerns to be:  new pt (Pt is present to est care with pcp) and Ear Pain (Pt has had blood in right ear and popping and clicking sounds in ear. Stated she went to urgent care couple weeks ago for this they started her on abx for this. Provider there told her not for sure if infection due to a lot blood in ear hard to see.)   Building a Complete Picture  To create the most effective care plan possible, we may need additional information from previous providers. We encouraged you to gather any relevant medical records for your next visit. This will help us  build a more complete picture and develop a personalized plan together. In the meantime, we'll address your immediate concerns and provide resources to help you manage all of your medical issues.  We encourage you to use MyChart to review these efforts, and to help us  find and correct any omissions or errors in your medical chart.  Managing Your Health Over Time  Managing every aspect of your health in a single visit isn't always feasible, but that's okay.  We addressed your most pressing concerns today and  charted a course for future care. Acute conditions or preventive care measures may require further attention.  We encourage you to schedule a follow-up visit at your earliest convenience to discuss any unresolved issues.  We strongly encourage participation in annual preventive care visits to help us  develop a more thorough understanding of your health and to help you maintain optimal wellness - please inquire about scheduling your next one with us  at your earliest convenience.  Your Satisfaction Matters  It was a pleasure seeing you today!  Your health and satisfaction will always be my top priorities. If you believe your experience today was worthy of a 5-star rating, I'd be grateful for your feedback!  Bernardino KANDICE Cone, MD   Next Steps  Schedule Follow-Up:  We recommend a follow-up appointment in No follow-ups on file. If your condition worsens before then, please call us  or seek emergency care. Preventive Care:  Don't forget to schedule your annual preventive care visit!  This important checkup is typically covered by insurance and helps identify potential health issues early.  Typically its 100% insurance covered with no co-pay and helps to get surveillance labwork paid for through your insurance provider.  Sometimes it even lowers your insurance premiums to participate. Medical Information Release:  For any relevant medical information we don't have, please sign a release form so we can obtain it for your records. Lab & X-ray Appointments:  Scheduled any incomplete lab tests today or call us  to schedule.  X-Rays can be done without an appointment at [facility] at [facility] ([address]), M-F 8:30am-noon or 1pm-5pm.  Just tell them you're there for X-rays ordered by Dr. Cone.  We'll receive the results and contact you by phone or MyChart to discuss next steps.  Bring to Your Next Appointment  Medications: Please bring all your medication bottles to your next appointment  to ensure we have an accurate record of your prescriptions. Health Diaries: If you're monitoring any health conditions at home, keeping a diary of your readings can be very helpful for discussions at your next appointment.  Reviewing Your Records  Please Review this early draft of your clinical notes below and the final encounter summary tomorrow on MyChart after its been completed.   Tympanic membrane perforation, right -     Ambulatory referral to ENT -     Amoxicillin -Pot Clavulanate; Take 1 tablet by mouth 2 (two) times daily.  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  Dispense: 60 capsule; Refill: 2     Getting Answers and Following Up  Simple Questions & Concerns: For quick questions or basic follow-up after your visit, reach us  at [phone] or MyChart messaging. Complex Concerns: If your concern is more complex, scheduling an appointment might be best. Discuss this with the staff to find the most suitable option. Lab & Imaging Results: We'll contact you directly if results are abnormal or you don't use MyChart. Most normal results will be on MyChart within 2-3 business days, with a review message from Dr. Cone. Haven't heard back in 2 weeks? Need results sooner? Contact us  at [phone]. Referrals: Our referral coordinator will manage specialist referrals. The specialist's office  should contact you within 2 weeks to schedule an appointment. Call us  if you haven't heard from them after 2 weeks.  Staying Connected  MyChart: Activate your MyChart for the fastest way to access results and message us . See the last page of this paperwork for instructions.  Billing  X-ray & Lab Orders: These are billed by separate  companies. Contact the invoicing company directly for questions or concerns. Visit Charges: Discuss any billing inquiries with our administrative services team.  Feedback & Satisfaction  Share Your Experience: We strive for your satisfaction! If you have any complaints, please let Dr. Jesus know directly or contact our Practice Administrators, Manuelita Rubin or [employer], by asking at the front desk.  Scheduling Tips  Shorter Wait Times: 8 am and 1 pm appointments often have the quickest wait times. Longer Appointments: If you need more time during your visit, talk to the front desk. Due to insurance regulations, multiple back-to-back appointments might be necessary.

## 2024-03-01 NOTE — Progress Notes (Signed)
 [facility]  Phone: [phone]  - Medical Office Visit -  Visit Date: 03/01/2024 Patient: Mary Davenport   DOB: Aug 28, 1975   48 y.o. Female  MRN: [id number] Patient Care Team: Jesus Bernardino MATSU, MD as PCP - General (Internal Medicine) Himmelrich, Camie RAMAN, RD (Inactive) as Dietitian [place]) Today's Health Care Provider: Bernardino MATSU Jesus, MD  ===========================================    Chief Complaint / Reason for Visit: new pt (Pt is present to est care with pcp) and Ear Pain (Pt has had blood in right ear and popping and clicking sounds in ear. Stated she went to urgent care couple weeks ago for this they started her on abx for this. Provider there told her not for sure if infection due to a lot blood in ear hard to see.)   Background: 48 y.o. 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  Discussed the use of AI scribe software for clinical note transcription with the patient, who gave verbal consent to proceed.  History of Present Illness Mary Davenport is a 48 year old female who presents with ear pain and hearing changes.  She has been experiencing ear pain for one day, initially rated as 4 out of 10, with bloody drainage noted in the ear canal. The pain is persistent, accompanied by a sensation of popping and cracking in the ear, and tenderness when lying on it. She avoids getting the ear wet and sleeps on the opposite side to minimize discomfort.  She was evaluated at urgent care on June 20th, where she was told there may be a tympanic membrane  rupture, but it was not visible due to blood in the ear canal. She completed a course of antibiotics but feels the issue has not fully resolved. She reports hearing changes, with bone conduction louder in the right ear and air conduction louder in the left ear.  She has a history of recurrent ear infections, with a recent infection prior to the current episode. No fever reported. Her past medical history includes psoriatic arthritis, B12 deficiency, GERD (resolved), hidradenitis, a resolved left ovarian cyst, and a history of hysterectomy and sleeve gastrectomy, leading to malabsorption issues. She is lactose intolerant and has a shellfish allergy . She takes a multivitamin and is aware of the need to switch to a 50+ vitamin formulation. She does not currently take medication for GERD as it was resolved post-bariatric surgery.  She has a history of asthma, which has been resolved, and maintains a rescue inhaler but has not needed it for years. She is not currently on any pain medication but has used NSAIDs sparingly due to her sleeve gastrectomy.  Problem overviews updated today: Problem  Pain in Left Elbow  Malabsorption Syndrome Due to Lactose Intolerance  Malabsorption  Dysfunction of Right Eustachian Tube  Tympanic Membrane Perforation, Right  Subcutaneous Mass of Finger of Left Hand  B12 Deficiency   Lab Results  Component Value Date   VITAMINB12 736 12/16/2023     Gerd (Gastroesophageal Reflux Disease) (Resolved)    Medications updated/reviewed: Current Outpatient Medications on File Prior to Visit  Medication Sig   albuterol  (VENTOLIN  HFA) 108 (90 Base) MCG/ACT inhaler Inhale 2 puffs into the lungs  every 6 (six) hours as needed for wheezing or shortness of breath.   amoxicillin -clavulanate (AUGMENTIN ) 875-125 MG tablet Take 1 tablet by mouth every 12 (twelve) hours.   cyanocobalamin  (VITAMIN B12) 1000 MCG/ML injection Inject 1,000 mcg into the muscle once. (Patient not taking:  Reported on 12/16/2023)   estradiol  (CLIMARA  - DOSED IN MG/24 HR) 0.05 mg/24hr patch Place 1 patch (0.05 mg total) onto the skin once a week.   fluticasone  (FLONASE ) 50 MCG/ACT nasal spray Place 2 sprays into both nostrils daily.   golimumab (SIMPONI ARIA) 50 MG/4ML SOLN injection Infuse 2mg /kg Intravenous every 8 weeks   levocetirizine (XYZAL ) 5 MG tablet Take 1 tablet (5 mg total) by mouth every evening.   montelukast  (SINGULAIR ) 10 MG tablet Take 1 tablet (10 mg total) by mouth at bedtime.   Multiple Vitamins-Minerals (MULTIVITAMIN ADULT) CHEW Chew by mouth. (Patient not taking: Reported on 12/16/2023)   Vitamin D , Ergocalciferol , (DRISDOL ) 1.25 MG (50000 UNIT) CAPS capsule Take 1 capsule (50,000 Units total) by mouth every 7 (seven) days.   ZORYVE 0.3 % CREA Apply topically.   No current facility-administered medications on file prior to visit.  There are no discontinued medications. Current Meds  Medication Sig   amoxicillin -clavulanate (AUGMENTIN ) 875-125 MG tablet Take 1 tablet by mouth 2 (two) times daily.   celecoxib  (CELEBREX ) 100 MG capsule Take 1 capsule (100 mg total) by mouth 2 (two) times daily.    Allergies:   Allergies as of 03/01/2024 - Review Complete 03/01/2024  Allergen Reaction Noted   Iodinated contrast media Hives 04/25/2013   Lactose intolerance (gi) Other (See Comments) 05/16/2013   Shellfish allergy  Nausea And Vomiting 05/15/2013   Sulfa antibiotics Hives 04/25/2013   Whey protein [protein] Other (See Comments) 05/16/2013   Erythromycin Rash 04/25/2013   Sudafed [pseudoephedrine] Palpitations 04/25/2013   Past Medical History:  has a past medical history of Allergy , Anemia, Anxiety, Arthritis, Asthma, Carpal tunnel syndrome, Complication of anesthesia, Depression, Endometriosis of ovary, GERD (gastroesophageal reflux disease), Headache(784.0), Heart murmur, Hiatal hernia, Hidradenitis suppurativa, MRSA carrier, Obesity, Plantar fasciitis (2009), PONV  (postoperative nausea and vomiting), and Psoriatic arthritis (HCC). Past Surgical History:   has a past surgical history that includes Dilation and curettage of uterus; Cholecystectomy; Tonsillectomy; Laparoscopic gastric sleeve resection (N/A, 07/03/2013); Upper gi endoscopy (07/03/2013); Diagnostic laparoscopy (03/1995); Laparoscopic total hysterectomy (07/2010); Shoulder surgery (Left, 04/04/2018); Ankle fracture surgery (Left, 04/06/2020); Abdominal hysterectomy; and Fracture surgery. Social History:   reports that she has never smoked. She has never been exposed to tobacco smoke. She has never used smokeless tobacco. She reports that she does not drink alcohol and does not use drugs. Family History:  family history includes ADD / ADHD in her son and son; Alcohol abuse in her mother; Anxiety disorder in her maternal grandmother and mother; Arthritis in her maternal grandmother and mother; Asthma in her maternal grandmother and mother; Breast cancer in an other family member; COPD in her maternal grandmother and mother; Depression in her maternal grandmother and mother; Diabetes in her maternal grandfather, maternal grandmother, and mother; Drug abuse in her mother; Hearing loss in her maternal grandfather; Heart disease in her maternal grandfather, maternal grandmother, and mother; Hyperlipidemia in her mother; Hypertension in her maternal grandmother and mother; Learning disabilities in her son; Obesity in her maternal grandfather and mother; Stroke in her maternal grandmother and maternal uncle; Vision loss in her maternal grandmother. Depression Screen and Health Maintenance:    03/01/2024    8:23 AM 12/16/2023   10:01 AM  12/06/2022    8:53 AM 01/07/2022    4:23 PM  PHQ 2/9 Scores  PHQ - 2 Score 0 0 0 0   Health Maintenance  Topic Date Due   COVID-19 Vaccine (1) Never done   Pneumococcal Vaccine 15-52 Years old (1 of 2 - PCV) Never done   Hepatitis B Vaccines (1 of 3 - 19+ 3-dose series) Never  done   Zoster Vaccines- Shingrix (1 of 2) Never done   DTaP/Tdap/Td (2 - Td or Tdap) 05/01/2023   INFLUENZA VACCINE  03/23/2024   MAMMOGRAM  12/04/2025   Fecal DNA (Cologuard)  01/02/2027   Hepatitis C Screening  Completed   HIV Screening  Completed   HPV VACCINES  Aged Out   Meningococcal B Vaccine  Aged Out   Immunization History  Administered Date(s) Administered   Influenza Split 05/14/2014, 05/14/2015, 06/09/2018   Influenza-Unspecified 06/06/2019, 06/13/2021, 06/06/2022   Tdap 04/30/2013     Objective   Physical ExamBP 112/72   Pulse 64   Temp 98 F (36.7 C) (Temporal)   Ht 5' 3.98 (1.625 m)   Wt 159 lb 9.6 oz (72.4 kg)   SpO2 98%   BMI 27.41 kg/m  Wt Readings from Last 10 Encounters:  03/01/24 159 lb 9.6 oz (72.4 kg)  12/16/23 168 lb 9.6 oz (76.5 kg)  10/14/23 186 lb 2 oz (84.4 kg)  12/06/22 192 lb 12.8 oz (87.5 kg)  10/06/22 207 lb (93.9 kg)  01/07/22 199 lb 6.4 oz (90.4 kg)  12/23/21 199 lb 6.4 oz (90.4 kg)  11/24/21 198 lb (89.8 kg)  10/28/20 187 lb 6.4 oz (85 kg)  03/20/20 200 lb (90.7 kg)  Vital signs reviewed.  Nursing notes reviewed. Weight trend reviewed. General Appearance:  Well developed, well nourished, well-groomed, healthy-appearing female with Body mass index is 27.41 kg/m. No acute distress appreciable.   Skin: Clear and well-hydrated. Pulmonary:  Normal work of breathing at rest, no respiratory distress apparent. SpO2: 98 %  Musculoskeletal: She demonstrates smooth and coordinated movements throughout all major joints.All extremities are intact.  Neurological:  Awake, alert, oriented, and engaged.  No obvious focal neurological deficits or cognitive impairments.  Sensorium seems unclouded.  Psychiatric:  Appropriate mood, pleasant and cooperative demeanor, cheerful and engaged during the exam  Reviewed Results & Data Results DIAGNOSTIC Ear Examination: Hemotympanum without tympanic membrane rupture (02/10/2024) tuning fork reveals ac>BC in R  ear, but BC>ac in left ear.    No results found for any visits on 03/01/24.  Office Visit on 12/16/2023  Component Date Value   COLOGUARD 01/02/2024 Negative    Vit D, 25-Hydroxy 12/16/2023 21.0 (L)    Vitamin B-12 12/16/2023 736    TSH 12/16/2023 [facility]   Hospital Outpatient Visit on 09/16/2023  Component Date Value   Genetic Analysis Overall* 09/16/2023 Negative    Genetic Disease Assessed 09/16/2023                     Value:[employer] Population Screen is a screening test that analyzes 11 genes related to hereditary breast and ovarian cancer (HBOC) syndrome, Lynch syndrome, and familial hypercholesterolemia. This test only reports clinically significant pathogenic and  likely pathogenic variants but does not report variants of uncertain significance (VUS). In addition, analysis of the PMS2 gene excludes exons 11-15, which overlap with a known pseudogene (PMS2CL).    Genetic Analysis Report 09/16/2023  Value:No pathogenic or likely pathogenic variants were detected in the genes analyzed by this test.Genetic test results should be interpreted in the context of an individual's personal medical and family history. Alteration to medical management is NOT  recommended based solely on this result. Clinical correlation is advised.Additional Considerations- This is a screening test; individuals may still carry pathogenic or likely pathogenic variant(s) in the tested genes that are not detected by this test.-  For individuals at risk for these or other related conditions based on factors including personal or family history, diagnostic testing is recommended.- The absence of pathogenic or likely pathogenic variant(s) in the analyzed genes, while reassuring,  does not eliminate the possibility of a hereditary condition; there are other variants and genes associated with heart disease and hereditary cancer that are not included in this test.    Genes Tested 09/16/2023 See Notes     Disclaimer 09/16/2023 See Notes    Sequencing Location 09/16/2023 See Notes    Interpretation Methods a* 09/16/2023 See Notes    No image results found.   MM 3D SCREENING MAMMOGRAM BILATERAL BREAST Result Date: 12/08/2023 CLINICAL DATA:  Screening. EXAM: DIGITAL SCREENING BILATERAL MAMMOGRAM WITH TOMOSYNTHESIS AND CAD TECHNIQUE: Bilateral screening digital craniocaudal and mediolateral oblique mammograms were obtained. Bilateral screening digital breast tomosynthesis was performed. The images were evaluated with computer-aided detection. COMPARISON:  Previous exam(s). ACR Breast Density Category b: There are scattered areas of fibroglandular density. FINDINGS: There are no findings suspicious for malignancy. IMPRESSION: No mammographic evidence of malignancy. A result letter of this screening mammogram will be mailed directly to the patient. RECOMMENDATION: Screening mammogram in one year. (Code:SM-B-01Y) BI-RADS CATEGORY  1: Negative. Electronically Signed   By: Inocente Ast M.D.   On: 12/08/2023 13:22   No results found.    Assessment & Plan Tympanic membrane perforation, right Suspected tympanic membrane rupture is due to a longstanding ear infection and eustachian tube dysfunction, presenting with persistent ear pain, popping, and cracking sounds, and hearing changes. Blood in the ear canal obscures visualization of the rupture. The condition has persisted for over two weeks without resolution. ENT referral is necessary for further evaluation and potential patch repair. Prescribe another round of Augmentin  to minimize persistent infection and prevent complications such as brain infection. Advise avoiding water in the ear and keeping it protected from external elements. Educate on the potential need for long-term ENT follow-up due to possible hearing impact and risk of recurrent infections. Dysfunction of right eustachian tube Chronic eustachian tube dysfunction likely contributes to the  tympanic membrane rupture. She has a history of ear infections and eustachian tube problems since childhood, including having tubes as a child. Allergies may exacerbate the condition. Recommend saline nasal spray and Flonase  to manage symptoms and prevent future complications. Instruct on proper nasal spray application technique, including leaning forward at a 45-degree angle to maximize drainage. B12 deficiency B12 deficiency is likely due to malabsorption following bariatric surgery. She is aware and under the care of another provider for monitoring and supplementation. Other specified intestinal malabsorption Secondary to gastric sleeve and weight loss. 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 She is lactose intolerant and has a shellfish allergy . Advise switching to a multivitamin formulated for individuals over 50 and encourage maintaining a high-protein diet and using resistance bands to prevent muscle and bone loss. Psoriatic arthritis (HCC) Chronic condition with ongoing symptoms, including arthralgia of  the upper arm and a nodule on the finger of the left hand, planned for surgical removal by a hand surgeon. Prescribe Celebrex  for pain management, considering sleeve gastrectomy and inability to use NSAIDs. At 48 years old, she requires age-appropriate vaccinations and screenings. Asthma is present without recent flares or need for a rescue inhaler. Recommend shingles vaccination and discuss the option of pneumonia vaccination due to asthma.  ED Discharge Orders          Ordered    Ambulatory referral to ENT       Comments: Bloody discharge, not improving, bc>ac right ear.   03/01/24 0846    amoxicillin -clavulanate (AUGMENTIN ) 875-125 MG tablet  2 times daily        03/01/24 0858    celecoxib  (CELEBREX ) 100 MG capsule  2 times daily        03/01/24 0913           Diagnoses and all orders for this visit: Tympanic membrane perforation, right -     Ambulatory referral to ENT -     amoxicillin -clavulanate (AUGMENTIN ) 875-125 MG tablet; Take 1 tablet by mouth 2 (two) times daily. 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  Future Appointments  Date Time Provider Department Center  09/03/2024  8:00 AM Jesus Bernardino MATSU, MD [facility]  10/17/2024  3:15 PM Iva Marty Saltness, MD [facility] None         Additional notes: This document was synthesized by artificial intelligence ([employer]bridge) using HIPAA-compliant recording of the clinical interaction;   We discussed the use of AI scribe software for clinical note transcription with the patient, who gave verbal consent to proceed.    Additional Info: This encounter employed state-of-the-art, real-time, collaborative documentation. The patient actively reviewed and assisted in updating their electronic medical record on a shared screen, ensuring transparency and facilitating joint problem-solving for the problem list, overview, and plan. This approach promotes accurate, informed care. The treatment plan was discussed and reviewed in detail, including medication safety, potential side effects, and all patient questions. We confirmed understanding and comfort with the plan. Follow-up instructions were established, including contacting the office for any concerns, returning if symptoms worsen, persist, or new symptoms develop, and precautions for potential emergency department visits.  Initial Appointment Goals:  This initial visit focused on establishing a foundation for the patient's care. We collaboratively reviewed her medical history and medications in detail,  updating the chart as shown in the encounter. Given the extensive information, we prioritized addressing her most pressing concerns, which she reported were: new pt (Pt is present to est care with pcp) and Ear Pain (Pt has had blood in right ear and popping and clicking sounds in ear. Stated she went to urgent care couple weeks ago for this they started her on abx for this. Provider there told her not for sure if infection due to a lot blood in ear hard to see.)  While the complexity of the patient's medical picture may necessitate further evaluation in subsequent visits, we were able to develop a preliminary care plan together. To expedite a comprehensive plan at the next visit, we encouraged the patient to gather relevant medical records from previous providers. This collaborative approach will ensure a more complete understanding of the patient's health and inform  the development of a personalized care plan. We look forward to continuing the conversation and working together with the patient on achieving her health goals.   Collaborative Documentation:  Today's encounter utilized real-time, dynamic patient engagement.  Patients actively participate by directly reviewing and assisting in updating their medical records through a shared screen. This transparency empowers patients to visually confirm chart updates made by the healthcare provider.  This collaborative approach facilitates problem management as we jointly update the problem list, problem overview, and assessment/plan. Ultimately, this process enhances chart accuracy and completeness, fostering shared decision-making, patient education, and informed consent for tests and treatments.  Collaborative Treatment Planning:  Treatment plans were discussed and reviewed in detail.  Explained medication safety and potential side effects.  Encouraged participation and answered all patient questions, confirming understanding and comfort with the plan.  Encouraged patient to contact our office if they have any questions or concerns. Agreed on patient returning to office if symptoms worsen, persist, or new symptoms develop.  ----------------------------------------------------- Bernardino KANDICE Cone, MD  03/01/2024 7:11 PM  [facility]:  [phone]

## 2024-03-01 NOTE — Assessment & Plan Note (Signed)
 Secondary to gastric sleeve and weight loss. Encouraged patient to take a multivitamin

## 2024-03-01 NOTE — Assessment & Plan Note (Signed)
 Sleeve gastrectomy has led to malabsorption issues, including B12 deficiency. She is lactose intolerant and has a shellfish allergy . Advise switching to a multivitamin formulated for individuals over 50 and encourage maintaining a high-protein diet and using resistance bands to prevent muscle and bone loss.

## 2024-03-01 NOTE — Assessment & Plan Note (Signed)
 B12 deficiency is likely due to malabsorption following bariatric surgery. She is aware and under the care of another provider for monitoring and supplementation.

## 2024-03-01 NOTE — Assessment & Plan Note (Signed)
 Chronic condition with ongoing symptoms, including arthralgia of the upper arm and a nodule on the finger of the left hand, planned for surgical removal by a hand surgeon. Prescribe Celebrex  for pain management, considering sleeve gastrectomy and inability to use NSAIDs.

## 2024-03-01 NOTE — Assessment & Plan Note (Signed)
 Chronic eustachian tube dysfunction likely contributes to the tympanic membrane rupture. She has a history of ear infections and eustachian tube problems since childhood, including having tubes as a child. Allergies may exacerbate the condition. Recommend saline nasal spray and Flonase  to manage symptoms and prevent future complications. Instruct on proper nasal spray application technique, including leaning forward at a 45-degree angle to maximize drainage.

## 2024-03-01 NOTE — Assessment & Plan Note (Signed)
 Suspected tympanic membrane rupture is due to a longstanding ear infection and eustachian tube dysfunction, presenting with persistent ear pain, popping, and cracking sounds, and hearing changes. Blood in the ear canal obscures visualization of the rupture. The condition has persisted for over two weeks without resolution. ENT referral is necessary for further evaluation and potential patch repair. Prescribe another round of Augmentin  to minimize persistent infection and prevent complications such as brain infection. Advise avoiding water in the ear and keeping it protected from external elements. Educate on the potential need for long-term ENT follow-up due to possible hearing impact and risk of recurrent infections.

## 2024-03-12 ENCOUNTER — Ambulatory Visit (INDEPENDENT_AMBULATORY_CARE_PROVIDER_SITE_OTHER): Admitting: Physician Assistant

## 2024-03-12 ENCOUNTER — Encounter (INDEPENDENT_AMBULATORY_CARE_PROVIDER_SITE_OTHER): Payer: Self-pay | Admitting: Physician Assistant

## 2024-03-12 ENCOUNTER — Encounter (INDEPENDENT_AMBULATORY_CARE_PROVIDER_SITE_OTHER): Payer: Self-pay

## 2024-03-12 VITALS — BP 107/71 | HR 72

## 2024-03-12 DIAGNOSIS — Z09 Encounter for follow-up examination after completed treatment for conditions other than malignant neoplasm: Secondary | ICD-10-CM | POA: Diagnosis not present

## 2024-03-12 DIAGNOSIS — Z8669 Personal history of other diseases of the nervous system and sense organs: Secondary | ICD-10-CM

## 2024-03-12 DIAGNOSIS — H6991 Unspecified Eustachian tube disorder, right ear: Secondary | ICD-10-CM

## 2024-03-12 MED ORDER — FLUTICASONE PROPIONATE 50 MCG/ACT NA SUSP
2.0000 | Freq: Every day | NASAL | 6 refills | Status: AC
Start: 2024-03-12 — End: ?

## 2024-03-12 NOTE — Progress Notes (Signed)
 Dear Dr. Jesus, Here is my assessment for our mutual patient, Mary Davenport. Thank you for allowing me the opportunity to care for your patient. Please do not hesitate to contact me should you have any other questions. Sincerely, Chyrl Cohen PA-C  Otolaryngology Clinic Note Referring provider: Dr. Jesus HPI:  Mary Davenport is a 49 y.o. female kindly referred by Dr. Jesus   The patient is a 48 year old female seen in our office for evaluation of ruptured right tympanic membrane.  The patient notes a significant past medical history recurrent ear infections as a child.  She notes she did not have tympanostomy tubes but did have a tonsillectomy.  She notes that she outgrew the ear infections and did not have any as an adult until last year.  She notes over the last year she has had 3 episodes of otitis media on the right.  The most recent one was on June 20.  She notes that she had been seen in urgent care and placed on antibiotics.  She denied any associated upper respiratory symptoms with this so she thought this was strange.  She does note that at her last visit they looked in her ear and saw blood and were unable to visualize the tympanic membrane.  She notes that she has had some discomfort in the ear, she notes this is worse with laying on the right side.  She has had some clicking and popping.  She denies any drainage from the ear.  Notes slightly decreased hearing on the right when compared to left, she notes normally her hearing is good and has had hearing evaluations through her workplace over the last several years.  Today she notes some minimal discomfort in the right ear, no dizziness, no ringing in the ear.  She is finishing a course of Augmentin  that was prescribed by her primary care provider.  She notes a history of seasonal allergies and is seeing an allergist.  She currently takes Zyrtec daily.  She also recently started nasal saline irrigations as recommended by her primary care  physician.      Independent Review of Additional Tests or Records:  Primary care office visit note on 03/01/2024   PMH/Meds/All/SocHx/FamHx/ROS:   Past Medical History:  Diagnosis Date   Allergy     tx. occ. with OTC meds   Anemia    Anxiety    Arthritis    feet, back, hips   Asthma    has been controlled, not rescue inhaler at present   Carpal tunnel syndrome    Complication of anesthesia    coming out of anesthesia with D & C-combative,not breathing, when allowed to come out slower, this was not an issue   Depression    Endometriosis of ovary    more resolved since hysterectomy   GERD (gastroesophageal reflux disease)    Headache(784.0)    hx. migraines, none in 5 yrs   Heart murmur    Hiatal hernia    Hidradenitis suppurativa    followed by [facility]    MRSA carrier    Obesity    Plantar fasciitis 2009   PONV (postoperative nausea and vomiting)    Psoriatic arthritis (HCC)    followed by Dr. Jon Learn at [facility]      Past Surgical History:  Procedure Laterality Date   ABDOMINAL HYSTERECTOMY     ANKLE FRACTURE SURGERY Left 04/06/2020   hardware put in due to break   CHOLECYSTECTOMY  laparoscopic   DIAGNOSTIC LAPAROSCOPY  03/1995   lysis/endometriosis ([facility])   DILATION AND CURETTAGE OF UTERUS     FRACTURE SURGERY     LAPAROSCOPIC GASTRIC SLEEVE RESECTION N/A 07/03/2013   Procedure: LAPAROSCOPIC GASTRIC SLEEVE RESECTION;  Surgeon: Donnice KATHEE Lunger, MD;  Location: [facility];  Service: General;  Laterality: N/A;   LAPAROSCOPIC TOTAL HYSTERECTOMY  07/2010   robotic assisted   SHOULDER SURGERY Left 04/04/2018   scar tissue    TONSILLECTOMY     UPPER GI ENDOSCOPY  07/03/2013   Procedure: UPPER GI ENDOSCOPY;  Surgeon: Donnice KATHEE Lunger, MD;  Location: [facility];  Service: General;;    Family History  Problem Relation Age of Onset   Heart disease Mother    Hyperlipidemia Mother    Hypertension Mother     Alcohol abuse Mother    Anxiety disorder Mother    Arthritis Mother    Asthma Mother    COPD Mother    Depression Mother    Diabetes Mother    Drug abuse Mother    Obesity Mother    Diabetes Maternal Grandmother    Hypertension Maternal Grandmother    Heart disease Maternal Grandmother    Anxiety disorder Maternal Grandmother    Arthritis Maternal Grandmother    Asthma Maternal Grandmother    COPD Maternal Grandmother    Depression Maternal Grandmother    Stroke Maternal Grandmother    Vision loss Maternal Grandmother    Breast cancer Other        maternal great aunt   Heart disease Maternal Grandfather    Diabetes Maternal Grandfather    Hearing loss Maternal Grandfather    Obesity Maternal Grandfather    ADD / ADHD Son    Learning disabilities Son    ADD / ADHD Son    Stroke Maternal Uncle      Social Connections: Socially Integrated (02/23/2024)   Social Connection and Isolation Panel    Frequency of Communication with Friends and Family: More than three times a week    Frequency of Social Gatherings with Friends and Family: Never    Attends Religious Services: More than 4 times per year    Active Member of [employer] or Organizations: Yes    Attends Engineer, structural: More than 4 times per year    Marital Status: Married      Current Outpatient Medications:    albuterol  (VENTOLIN  HFA) 108 (90 Base) MCG/ACT inhaler, Inhale 2 puffs into the lungs every 6 (six) hours as needed for wheezing or shortness of breath., Disp: 18 g, Rfl: 2   amoxicillin -clavulanate (AUGMENTIN ) 875-125 MG tablet, Take 1 tablet by mouth every 12 (twelve) hours., Disp: 14 tablet, Rfl: 0   amoxicillin -clavulanate (AUGMENTIN ) 875-125 MG tablet, Take 1 tablet by mouth 2 (two) times daily., Disp: 20 tablet, Rfl: 0   celecoxib  (CELEBREX ) 100 MG capsule, Take 1 capsule (100 mg total) by mouth 2 (two) times daily., Disp: 60 capsule, Rfl: 2   estradiol  (CLIMARA  - DOSED IN MG/24 HR) 0.05 mg/24hr  patch, Place 1 patch (0.05 mg total) onto the skin once a week., Disp: 12 patch, Rfl: 3   fluticasone  (FLONASE ) 50 MCG/ACT nasal spray, Place 2 sprays into both nostrils daily., Disp: 16 g, Rfl: 0   golimumab (SIMPONI ARIA) 50 MG/4ML SOLN injection, Infuse 2mg /kg Intravenous every 8 weeks, Disp: , Rfl:    levocetirizine (XYZAL ) 5 MG tablet, Take 1 tablet (5 mg total) by mouth every evening., Disp: 90 tablet, Rfl:  3   montelukast  (SINGULAIR ) 10 MG tablet, Take 1 tablet (10 mg total) by mouth at bedtime., Disp: 90 tablet, Rfl: 3   Vitamin D , Ergocalciferol , (DRISDOL ) 1.25 MG (50000 UNIT) CAPS capsule, Take 1 capsule (50,000 Units total) by mouth every 7 (seven) days., Disp: 12 capsule, Rfl: 0   ZORYVE 0.3 % CREA, Apply topically., Disp: , Rfl:    Physical Exam:   BP 107/71   Pulse 72   SpO2 97%   Pertinent Findings  CN II-XII intact Right external auditory canal with a small blood clot on the distal EAC, once removed underlying tissue is clean with no lesions, TM intact with well-pneumatized middle ear space no obvious tympanic membrane rupture, no middle ear effusion, left EAC clear, TM intact with well-pneumatized middle ear space Weber 512: Localizes right Rin[address] 512: AC > BC b/l  Anterior rhinoscopy: Septum midline; bilateral inferior turbinates with moderate hypertrophy No lesions of oral cavity/oropharynx; dentition within normal limits, absent tonsils No obviously palpable neck masses/lymphadenopathy/thyromegaly No respiratory distress or stridor  Seprately Identifiable Procedures:  None  Impression & Plans:  Mary Davenport is a 48 y.o. female with the following   Recurrent otitis media-  48 year old female seen in our office for evaluation of recurrent otitis media.  On exam today I see no signs of otitis media or TM rupture although given the interval time since the rupture this could have already healed.  She did have some blood in the external canal this was very sensitive when  I was removing it, this could be the source of some of her discomfort.  I see no underlying lesions or signs of trauma.  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  She describes some clicking and popping in with the suspected recurrence of otitis media I have suspicion for eustachian tube dysfunction.  She did have difficulties as a kid with ear infections and did see a ENT previously for sounds like eustachian tube dysfunction as an adult.  I have recommended she continue using Zyrtec, continue the saline nasal irrigation, I additionally would like her to use Flonase  to see if this helps with her symptoms.  Given the suspected conductive hearing loss on the right I would like her to complete an audiogram, I will call her with the results.  I would also like to see her back in the office in 2 months for repeat evaluation or sooner as needed.  If she does develop pain or signs of infection I like to see her to visualize the tympanic membrane.  The patient verbalized understanding and agreement to today's plan had no further questions or concerns.   - f/u audiological evaluation, I will call her with results.  17-month follow-up in clinic with me.     Thank you for allowing me the opportunity to care for your patient. Please do not hesitate to contact me should you have any other questions.  Sincerely, Chyrl Cohen PA-C [facility] Phone: [phone] Fax: [phone]  03/12/2024, 3:16 PM

## 2024-03-13 ENCOUNTER — Ambulatory Visit (INDEPENDENT_AMBULATORY_CARE_PROVIDER_SITE_OTHER): Admitting: Audiology

## 2024-03-13 ENCOUNTER — Telehealth (INDEPENDENT_AMBULATORY_CARE_PROVIDER_SITE_OTHER): Payer: Self-pay | Admitting: Physician Assistant

## 2024-03-13 DIAGNOSIS — Z0110 Encounter for examination of ears and hearing without abnormal findings: Secondary | ICD-10-CM

## 2024-03-13 DIAGNOSIS — H93291 Other abnormal auditory perceptions, right ear: Secondary | ICD-10-CM

## 2024-03-13 NOTE — Telephone Encounter (Signed)
 I spoke with Mary Davenport about her audiological evaluation.  She has normal hearing with bilateral type a tympanometry.  I reiterated that if she has any worsening symptoms on the right ear would like to see her in the office for evaluation.  Otherwise I will see her in 2 to 3 months for repeat evaluation as previously planned

## 2024-03-13 NOTE — Progress Notes (Signed)
  [address], [address] [place], [place] [address] [phone]  Audiological Evaluation    Name: Mary Davenport     DOB:   [birth year]      MRN:   [id number]                                                                                     Service Date: 03/13/2024     Accompanied by: unaccompanied   Patient comes today after Reyes Cohen, PA-C sent a referral for a hearing evaluation due to concerns with ear pain.   Symptoms Yes Details  Hearing loss  [x]  Reports that perceives right hearing decline after subsequent right ear infections.  Tinnitus  []    Ear pain/ infections/pressure  [x]  Reports ear infections every 3 months. Reports right ear pain/pressure. Today woke up with constant right ear pain. Urgent Care said she had a ruptured eardrum once.   Balance problems  [x]  Reports that she was once diagnosed with Meniere's- denied vertigo/tinnitus or hearing loss.  Noise exposure history  []    Previous ear surgeries  []    Family history of hearing loss  []    Amplification  []    Other  [x]  Reports grinds her teeth at night.    Otoscopy: Right ear: Clear external ear canal and notable landmarks visualized on the tympanic membrane. Left ear:  Clear external ear canal and notable landmarks visualized on the tympanic membrane.  Tympanometry: Right ear: Type A- Normal external ear canal volume with normal middle ear pressure and tympanic membrane compliance. Left ear: Type A- Normal external ear canal volume with normal middle ear pressure and tympanic membrane compliance.    Pure tone Audiometry: Normal hearing from [phone] Hz, in both ears.  Speech Audiometry: Right ear- Speech Reception Threshold (SRT) was obtained at 5 dBHL. Left ear-Speech Reception Threshold (SRT) was obtained at 5 dBHL.   Word Recognition Score Tested using NU-6 (recorded) Right ear: 100% was obtained at a presentation level of 50 dBHL with contralateral masking which is deemed as   excellent. Left ear: 100% was obtained at a presentation level of 50 dBHL with contralateral masking which is deemed as  excellent.   The hearing test results were completed under headphones and results are deemed to be of good to fair reliability. Test technique:  conventional      Recommendations: Follow up with ENT . Return for a hearing evaluation if concerns with hearing changes arise or per MD recommendation.   Bryanne Riquelme MARIE LEROUX-MARTINEZ, AUD

## 2024-03-19 ENCOUNTER — Other Ambulatory Visit (HOSPITAL_BASED_OUTPATIENT_CLINIC_OR_DEPARTMENT_OTHER): Payer: Self-pay | Admitting: Obstetrics & Gynecology

## 2024-03-19 ENCOUNTER — Encounter: Payer: Self-pay | Admitting: Audiology

## 2024-03-19 DIAGNOSIS — K9089 Other intestinal malabsorption: Secondary | ICD-10-CM

## 2024-03-19 DIAGNOSIS — E559 Vitamin D deficiency, unspecified: Secondary | ICD-10-CM

## 2024-04-08 ENCOUNTER — Encounter (INDEPENDENT_AMBULATORY_CARE_PROVIDER_SITE_OTHER): Payer: Self-pay

## 2024-04-09 ENCOUNTER — Ambulatory Visit (INDEPENDENT_AMBULATORY_CARE_PROVIDER_SITE_OTHER): Admitting: Physician Assistant

## 2024-04-09 VITALS — BP 105/73 | HR 65

## 2024-04-09 DIAGNOSIS — Z8669 Personal history of other diseases of the nervous system and sense organs: Secondary | ICD-10-CM | POA: Diagnosis not present

## 2024-04-09 DIAGNOSIS — Z09 Encounter for follow-up examination after completed treatment for conditions other than malignant neoplasm: Secondary | ICD-10-CM | POA: Diagnosis not present

## 2024-04-09 DIAGNOSIS — H6991 Unspecified Eustachian tube disorder, right ear: Secondary | ICD-10-CM

## 2024-04-09 NOTE — Progress Notes (Unsigned)
 Dear Dr. Jesus, Here is my assessment for our mutual patient, Mary Davenport. Thank you for allowing me the opportunity to care for your patient. Please do not hesitate to contact me should you have any other questions. Sincerely, Chyrl Cohen PA-C  Otolaryngology Clinic Note Referring provider: Dr. Jesus HPI:  Mary Davenport is a 48 y.o. female kindly referred by Dr. Jesus   The patient is a 48 year old female seen in our office for follow-up evaluation of right sided ear pain.  The patient was last seen in the office on 03/12/2024.  Below is a recap of that encounter.  The patient is a 48 year old female seen in our office for evaluation of ruptured right tympanic membrane.  The patient notes a significant past medical history recurrent ear infections as a child.  She notes she did not have tympanostomy tubes but did have a tonsillectomy.  She notes that she outgrew the ear infections and did not have any as an adult until last year.  She notes over the last year she has had 3 episodes of otitis media on the right.  The most recent one was on June 20.  She notes that she had been seen in urgent care and placed on antibiotics.  She denied any associated upper respiratory symptoms with this so she thought this was strange.  She does note that at her last visit they looked in her ear and saw blood and were unable to visualize the tympanic membrane.  She notes that she has had some discomfort in the ear, she notes this is worse with laying on the right side.  She has had some clicking and popping.  She denies any drainage from the ear.  Notes slightly decreased hearing on the right when compared to left, she notes normally her hearing is good and has had hearing evaluations through her workplace over the last several years.  Today she notes some minimal discomfort in the right ear, no dizziness, no ringing in the ear.  She is finishing a course of Augmentin  that was prescribed by her primary care  provider.  She notes a history of seasonal allergies and is seeing an allergist.  She currently takes Zyrtec daily.  She also recently started nasal saline irrigations as recommended by her primary care physician.   Update 04/09/2024  Since her last office visit she notes she has been doing well until approximately 2 to 3 days ago.  She notes she started developing little upper respiratory congestion, some minimal cracking in the right ear, hearing that feels like she is slightly underwater over the weekend but normal today.  She denies any fever, no other infectious signs or symptoms.  She notes this feels similar to when she had symptoms previously before they worsen.   Independent Review of Additional Tests or Records:  None   PMH/Meds/All/SocHx/FamHx/ROS:   Past Medical History:  Diagnosis Date   Allergy     tx. occ. with OTC meds   Anemia    Anxiety    Arthritis    feet, back, hips   Asthma    has been controlled, not rescue inhaler at present   Carpal tunnel syndrome    Complication of anesthesia    coming out of anesthesia with D & C-combative,not breathing, when allowed to come out slower, this was not an issue   Depression    Endometriosis of ovary    more resolved since hysterectomy   GERD (gastroesophageal reflux disease)    Headache(784.0)  hx. migraines, none in 5 yrs   Heart murmur    Hiatal hernia    Hidradenitis suppurativa    followed by [facility]    MRSA carrier    Obesity    Plantar fasciitis 2009   PONV (postoperative nausea and vomiting)    Psoriatic arthritis (HCC)    followed by Dr. Jon Learn at [facility]      Past Surgical History:  Procedure Laterality Date   ABDOMINAL HYSTERECTOMY     ANKLE FRACTURE SURGERY Left 04/06/2020   hardware put in due to break   CHOLECYSTECTOMY     laparoscopic   DIAGNOSTIC LAPAROSCOPY  03/1995   lysis/endometriosis ([facility])   DILATION AND CURETTAGE OF UTERUS      FRACTURE SURGERY     LAPAROSCOPIC GASTRIC SLEEVE RESECTION N/A 07/03/2013   Procedure: LAPAROSCOPIC GASTRIC SLEEVE RESECTION;  Surgeon: Donnice KATHEE Lunger, MD;  Location: [facility];  Service: General;  Laterality: N/A;   LAPAROSCOPIC TOTAL HYSTERECTOMY  07/2010   robotic assisted   SHOULDER SURGERY Left 04/04/2018   scar tissue    TONSILLECTOMY     UPPER GI ENDOSCOPY  07/03/2013   Procedure: UPPER GI ENDOSCOPY;  Surgeon: Donnice KATHEE Lunger, MD;  Location: [facility];  Service: General;;    Family History  Problem Relation Age of Onset   Heart disease Mother    Hyperlipidemia Mother    Hypertension Mother    Alcohol abuse Mother    Anxiety disorder Mother    Arthritis Mother    Asthma Mother    COPD Mother    Depression Mother    Diabetes Mother    Drug abuse Mother    Obesity Mother    Diabetes Maternal Grandmother    Hypertension Maternal Grandmother    Heart disease Maternal Grandmother    Anxiety disorder Maternal Grandmother    Arthritis Maternal Grandmother    Asthma Maternal Grandmother    COPD Maternal Grandmother    Depression Maternal Grandmother    Stroke Maternal Grandmother    Vision loss Maternal Grandmother    Breast cancer Other        maternal great aunt   Heart disease Maternal Grandfather    Diabetes Maternal Grandfather    Hearing loss Maternal Grandfather    Obesity Maternal Grandfather    ADD / ADHD Son    Learning disabilities Son    ADD / ADHD Son    Stroke Maternal Uncle      Social Connections: Socially Integrated (02/23/2024)   Social Connection and Isolation Panel    Frequency of Communication with Friends and Family: More than three times a week    Frequency of Social Gatherings with Friends and Family: Never    Attends Religious Services: More than 4 times per year    Active Member of Clubs or Organizations: Yes    Attends Engineer, structural: More than 4 times per year    Marital Status: Married      Current Outpatient  Medications:    albuterol  (VENTOLIN  HFA) 108 (90 Base) MCG/ACT inhaler, Inhale 2 puffs into the lungs every 6 (six) hours as needed for wheezing or shortness of breath., Disp: 18 g, Rfl: 2   amoxicillin -clavulanate (AUGMENTIN ) 875-125 MG tablet, Take 1 tablet by mouth every 12 (twelve) hours., Disp: 14 tablet, Rfl: 0   amoxicillin -clavulanate (AUGMENTIN ) 875-125 MG tablet, Take 1 tablet by mouth 2 (two) times daily., Disp: 20 tablet, Rfl: 0   celecoxib  (CELEBREX )  100 MG capsule, Take 1 capsule (100 mg total) by mouth 2 (two) times daily., Disp: 60 capsule, Rfl: 2   estradiol  (CLIMARA  - DOSED IN MG/24 HR) 0.05 mg/24hr patch, Place 1 patch (0.05 mg total) onto the skin once a week., Disp: 12 patch, Rfl: 3   fluticasone  (FLONASE ) 50 MCG/ACT nasal spray, Place 2 sprays into both nostrils daily., Disp: 16 g, Rfl: 0   fluticasone  (FLONASE ) 50 MCG/ACT nasal spray, Place 2 sprays into both nostrils daily., Disp: 16 g, Rfl: 6   golimumab (SIMPONI ARIA) 50 MG/4ML SOLN injection, Infuse 2mg /kg Intravenous every 8 weeks, Disp: , Rfl:    levocetirizine (XYZAL ) 5 MG tablet, Take 1 tablet (5 mg total) by mouth every evening., Disp: 90 tablet, Rfl: 3   montelukast  (SINGULAIR ) 10 MG tablet, Take 1 tablet (10 mg total) by mouth at bedtime., Disp: 90 tablet, Rfl: 3   Vitamin D , Ergocalciferol , (DRISDOL ) 1.25 MG (50000 UNIT) CAPS capsule, TAKE 1 CAPSULE (50,000 UNITS TOTAL) BY MOUTH EVERY 7 (SEVEN) DAYS, Disp: 12 capsule, Rfl: 0   ZORYVE 0.3 % CREA, Apply topically., Disp: , Rfl:    Physical Exam:   BP 105/73   Pulse 65   SpO2 98%   Pertinent Findings  CN II-XII intact Bilateral EAC clear and TM intact with well pneumatized middle ear spaces  Anterior rhinoscopy: Septum midline; bilateral inferior turbinates with moderate hypertrophy No lesions of oral cavity/oropharynx; dentition in normal limits No obviously palpable neck masses/lymphadenopathy/thyromegaly No respiratory distress or stridor  Seprately  Identifiable Procedures:  None  Impression & Plans:  Mary Davenport is a 48 y.o. female with the following   Right ear fullness-  Patient seen today for right ear complaints.  She called yesterday noting she started having cracking in the right ear again, this was similar to her previous but wanted to have it evaluated before symptoms worsen.  On exam today I see no no effusion, no infection, no significant abnormalities.  Overall she feels well with hearing at baseline.  If her symptoms worsen I would like her to reach out to the office as last time she did develop a rupture of the TM secondary to otitis media.  The patient is happy with today's plan, she will continue using antihistamine and Flonase  and follow-up on a as needed basis.  She verbalized understanding and agreement.   - f/u PRN   Thank you for allowing me the opportunity to care for your patient. Please do not hesitate to contact me should you have any other questions.  Sincerely, Chyrl Cohen PA-C [facility] Phone: [phone] Fax: [phone]  04/09/2024, 2:26 PM

## 2024-04-18 ENCOUNTER — Other Ambulatory Visit (HOSPITAL_BASED_OUTPATIENT_CLINIC_OR_DEPARTMENT_OTHER): Payer: Self-pay | Admitting: Obstetrics & Gynecology

## 2024-04-18 ENCOUNTER — Encounter (HOSPITAL_BASED_OUTPATIENT_CLINIC_OR_DEPARTMENT_OTHER): Payer: Self-pay | Admitting: Obstetrics & Gynecology

## 2024-04-18 DIAGNOSIS — E559 Vitamin D deficiency, unspecified: Secondary | ICD-10-CM

## 2024-04-18 DIAGNOSIS — Z9884 Bariatric surgery status: Secondary | ICD-10-CM

## 2024-04-21 LAB — VITAMIN D 25 HYDROXY (VIT D DEFICIENCY, FRACTURES): Vit D, 25-Hydroxy: 53.1 ng/mL (ref 30.0–100.0)

## 2024-04-24 ENCOUNTER — Ambulatory Visit (HOSPITAL_BASED_OUTPATIENT_CLINIC_OR_DEPARTMENT_OTHER): Payer: Self-pay | Admitting: Obstetrics & Gynecology

## 2024-04-24 DIAGNOSIS — E559 Vitamin D deficiency, unspecified: Secondary | ICD-10-CM

## 2024-04-24 DIAGNOSIS — K9089 Other intestinal malabsorption: Secondary | ICD-10-CM

## 2024-04-24 MED ORDER — VITAMIN D (ERGOCALCIFEROL) 1.25 MG (50000 UNIT) PO CAPS
50000.0000 [IU] | ORAL_CAPSULE | ORAL | 3 refills | Status: AC
Start: 2024-04-24 — End: ?

## 2024-05-14 ENCOUNTER — Ambulatory Visit (INDEPENDENT_AMBULATORY_CARE_PROVIDER_SITE_OTHER): Admitting: Physician Assistant

## 2024-05-14 ENCOUNTER — Encounter (INDEPENDENT_AMBULATORY_CARE_PROVIDER_SITE_OTHER): Payer: Self-pay | Admitting: Physician Assistant

## 2024-05-14 VITALS — BP 106/70 | HR 74 | Temp 98.4°F

## 2024-05-14 DIAGNOSIS — H6991 Unspecified Eustachian tube disorder, right ear: Secondary | ICD-10-CM

## 2024-05-14 DIAGNOSIS — Z8669 Personal history of other diseases of the nervous system and sense organs: Secondary | ICD-10-CM

## 2024-05-14 DIAGNOSIS — Z09 Encounter for follow-up examination after completed treatment for conditions other than malignant neoplasm: Secondary | ICD-10-CM

## 2024-05-14 NOTE — Progress Notes (Signed)
 Dear Dr. Jesus, Here is my assessment for our mutual patient, Mary Davenport. Thank you for allowing me the opportunity to care for your patient. Please do not hesitate to contact me should you have any other questions. Sincerely, Chyrl Cohen PA-C  Otolaryngology Clinic Note Referring provider: Dr. Jesus HPI:  Mary Davenport is a 48 y.o. female kindly referred by Dr. Jesus   The patient is a 48 year old female seen in our office for follow-up evaluation of her ear related complaints.  Patient was last seen in the office on 04/09/2024.  Below is a recap of that encounter.  The patient is a 48 year old female seen in our office for follow-up evaluation of right sided ear pain.  The patient was last seen in the office on 03/12/2024.  Below is a recap of that encounter.   The patient is a 48 year old female seen in our office for evaluation of ruptured right tympanic membrane.  The patient notes a significant past medical history recurrent ear infections as a child.  She notes she did not have tympanostomy tubes but did have a tonsillectomy.  She notes that she outgrew the ear infections and did not have any as an adult until last year.  She notes over the last year she has had 3 episodes of otitis media on the right.  The most recent one was on June 20.  She notes that she had been seen in urgent care and placed on antibiotics.  She denied any associated upper respiratory symptoms with this so she thought this was strange.  She does note that at her last visit they looked in her ear and saw blood and were unable to visualize the tympanic membrane.  She notes that she has had some discomfort in the ear, she notes this is worse with laying on the right side.  She has had some clicking and popping.  She denies any drainage from the ear.  Notes slightly decreased hearing on the right when compared to left, she notes normally her hearing is good and has had hearing evaluations through her workplace over  the last several years.  Today she notes some minimal discomfort in the right ear, no dizziness, no ringing in the ear.  She is finishing a course of Augmentin  that was prescribed by her primary care provider.  She notes a history of seasonal allergies and is seeing an allergist.  She currently takes Zyrtec daily.  She also recently started nasal saline irrigations as recommended by her primary care physician.    Update 04/09/2024   Since her last office visit she notes she has been doing well until approximately 2 to 3 days ago.  She notes she started developing little upper respiratory congestion, some minimal cracking in the right ear, hearing that feels like she is slightly underwater over the weekend but normal today.  She denies any fever, no other infectious signs or symptoms.  She notes this feels similar to when she had symptoms previously before they worsen.  Update 05/14/2024  Since her last office visit the patient notes she has been doing very well, she notes the pressure and cracking in her right ear has resolved.  She denies any dizziness, no pain, no drainage, no hearing loss, no cracking popping or any other concerns today.   Independent Review of Additional Tests or Records:  None   PMH/Meds/All/SocHx/FamHx/ROS:   Past Medical History:  Diagnosis Date   Allergy     tx. occ. with OTC meds   Anemia  Anxiety    Arthritis    feet, back, hips   Asthma    has been controlled, not rescue inhaler at present   Carpal tunnel syndrome    Complication of anesthesia    coming out of anesthesia with D & C-combative,not breathing, when allowed to come out slower, this was not an issue   Depression    Endometriosis of ovary    more resolved since hysterectomy   GERD (gastroesophageal reflux disease)    Headache(784.0)    hx. migraines, none in 5 yrs   Heart murmur    Hiatal hernia    Hidradenitis suppurativa    followed by [facility]     MRSA carrier    Obesity    Plantar fasciitis 2009   PONV (postoperative nausea and vomiting)    Psoriatic arthritis (HCC)    followed by Dr. Jon Learn at [facility]      Past Surgical History:  Procedure Laterality Date   ABDOMINAL HYSTERECTOMY     ANKLE FRACTURE SURGERY Left 04/06/2020   hardware put in due to break   CHOLECYSTECTOMY     laparoscopic   DIAGNOSTIC LAPAROSCOPY  03/1995   lysis/endometriosis ([facility])   DILATION AND CURETTAGE OF UTERUS     FRACTURE SURGERY     LAPAROSCOPIC GASTRIC SLEEVE RESECTION N/A 07/03/2013   Procedure: LAPAROSCOPIC GASTRIC SLEEVE RESECTION;  Surgeon: Donnice KATHEE Lunger, MD;  Location: [facility];  Service: General;  Laterality: N/A;   LAPAROSCOPIC TOTAL HYSTERECTOMY  07/2010   robotic assisted   SHOULDER SURGERY Left 04/04/2018   scar tissue    TONSILLECTOMY     UPPER GI ENDOSCOPY  07/03/2013   Procedure: UPPER GI ENDOSCOPY;  Surgeon: Donnice KATHEE Lunger, MD;  Location: [facility];  Service: General;;    Family History  Problem Relation Age of Onset   Heart disease Mother    Hyperlipidemia Mother    Hypertension Mother    Alcohol abuse Mother    Anxiety disorder Mother    Arthritis Mother    Asthma Mother    COPD Mother    Depression Mother    Diabetes Mother    Drug abuse Mother    Obesity Mother    Diabetes Maternal Grandmother    Hypertension Maternal Grandmother    Heart disease Maternal Grandmother    Anxiety disorder Maternal Grandmother    Arthritis Maternal Grandmother    Asthma Maternal Grandmother    COPD Maternal Grandmother    Depression Maternal Grandmother    Stroke Maternal Grandmother    Vision loss Maternal Grandmother    Breast cancer Other        maternal great aunt   Heart disease Maternal Grandfather    Diabetes Maternal Grandfather    Hearing loss Maternal Grandfather    Obesity Maternal Grandfather    ADD / ADHD Son    Learning disabilities Son    ADD / ADHD Son    Stroke Maternal Uncle       Social Connections: Socially Integrated (02/23/2024)   Social Connection and Isolation Panel    Frequency of Communication with Friends and Family: More than three times a week    Frequency of Social Gatherings with Friends and Family: Never    Attends Religious Services: More than 4 times per year    Active Member of Clubs or Organizations: Yes    Attends Engineer, structural: More than 4 times per year    Marital Status: Married  Current Outpatient Medications:    albuterol  (VENTOLIN  HFA) 108 (90 Base) MCG/ACT inhaler, Inhale 2 puffs into the lungs every 6 (six) hours as needed for wheezing or shortness of breath., Disp: 18 g, Rfl: 2   amoxicillin -clavulanate (AUGMENTIN ) 875-125 MG tablet, Take 1 tablet by mouth every 12 (twelve) hours., Disp: 14 tablet, Rfl: 0   amoxicillin -clavulanate (AUGMENTIN ) 875-125 MG tablet, Take 1 tablet by mouth 2 (two) times daily., Disp: 20 tablet, Rfl: 0   celecoxib  (CELEBREX ) 100 MG capsule, Take 1 capsule (100 mg total) by mouth 2 (two) times daily., Disp: 60 capsule, Rfl: 2   estradiol  (CLIMARA  - DOSED IN MG/24 HR) 0.05 mg/24hr patch, Place 1 patch (0.05 mg total) onto the skin once a week., Disp: 12 patch, Rfl: 3   fluticasone  (FLONASE ) 50 MCG/ACT nasal spray, Place 2 sprays into both nostrils daily., Disp: 16 g, Rfl: 0   fluticasone  (FLONASE ) 50 MCG/ACT nasal spray, Place 2 sprays into both nostrils daily., Disp: 16 g, Rfl: 6   golimumab (SIMPONI ARIA) 50 MG/4ML SOLN injection, Infuse 2mg /kg Intravenous every 8 weeks, Disp: , Rfl:    levocetirizine (XYZAL ) 5 MG tablet, Take 1 tablet (5 mg total) by mouth every evening., Disp: 90 tablet, Rfl: 3   montelukast  (SINGULAIR ) 10 MG tablet, Take 1 tablet (10 mg total) by mouth at bedtime., Disp: 90 tablet, Rfl: 3   Vitamin D , Ergocalciferol , (DRISDOL ) 1.25 MG (50000 UNIT) CAPS capsule, Take 1 capsule (50,000 Units total) by mouth every 14 (fourteen) days., Disp: 6 capsule, Rfl: 3   ZORYVE 0.3 %  CREA, Apply topically., Disp: , Rfl:    Physical Exam:   There were no vitals taken for this visit.  Pertinent Findings  CN II-XII intact Bilateral EAC clear and TM intact with well pneumatized middle ear spaces  No lesions of oral cavity/oropharynx; dentition within normal limits No obvious neck masses/lymphadenopathy/thyromegaly No respiratory distress or stridor  Seprately Identifiable Procedures:  None  Impression & Plans:  Maahi Lannan is a 48 y.o. female with the following   Right ear fullness-  Symptoms have resolved, doing very well today with no complaints.  At this point she may follow-up on a as needed basis.  I did recommend that she continue using Flonase , daily antihistamines.   - f/u PRN   Thank you for allowing me the opportunity to care for your patient. Please do not hesitate to contact me should you have any other questions.  Sincerely, Chyrl Cohen PA-C [facility] Phone: [phone] Fax: [phone]  05/14/2024, 8:31 AM

## 2024-09-03 ENCOUNTER — Ambulatory Visit: Admitting: Internal Medicine

## 2024-10-17 ENCOUNTER — Ambulatory Visit: Payer: 59 | Admitting: Allergy & Immunology

## 2025-02-20 ENCOUNTER — Ambulatory Visit (HOSPITAL_BASED_OUTPATIENT_CLINIC_OR_DEPARTMENT_OTHER): Payer: Self-pay | Admitting: Obstetrics & Gynecology
